# Patient Record
Sex: Male | Born: 1980
Health system: Southern US, Community
[De-identification: ages and names within clinical notes are randomized; demographics above are authoritative.]

## PROBLEM LIST (undated history)

## (undated) DIAGNOSIS — R0683 Snoring: Secondary | ICD-10-CM

## (undated) DIAGNOSIS — I10 Essential (primary) hypertension: Secondary | ICD-10-CM

## (undated) DIAGNOSIS — M549 Dorsalgia, unspecified: Secondary | ICD-10-CM

## (undated) DIAGNOSIS — R29818 Other symptoms and signs involving the nervous system: Secondary | ICD-10-CM

## (undated) DIAGNOSIS — E669 Obesity, unspecified: Secondary | ICD-10-CM

## (undated) DIAGNOSIS — Z720 Tobacco use: Secondary | ICD-10-CM

## (undated) DIAGNOSIS — F1090 Alcohol use, unspecified, uncomplicated: Secondary | ICD-10-CM

## (undated) HISTORY — DX: Dorsalgia, unspecified: M54.9

## (undated) HISTORY — DX: Essential (primary) hypertension: I10

## (undated) HISTORY — DX: Obesity, unspecified: E66.9

---
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
## 2001-02-04 ENCOUNTER — Encounter: Admission: RE | Admit: 2001-02-04 | Discharge: 2001-02-04 | Payer: Self-pay | Admitting: Family Medicine

## 2001-02-18 ENCOUNTER — Encounter: Admission: RE | Admit: 2001-02-18 | Discharge: 2001-02-18 | Payer: Self-pay | Admitting: Family Medicine

## 2001-02-20 ENCOUNTER — Encounter: Admission: RE | Admit: 2001-02-20 | Discharge: 2001-02-20 | Payer: Self-pay | Admitting: Family Medicine

## 2002-10-21 ENCOUNTER — Encounter: Payer: Self-pay | Admitting: Emergency Medicine

## 2002-10-21 ENCOUNTER — Emergency Department (HOSPITAL_COMMUNITY): Admission: EM | Admit: 2002-10-21 | Discharge: 2002-10-21 | Payer: Self-pay | Admitting: Emergency Medicine

## 2002-12-21 ENCOUNTER — Emergency Department (HOSPITAL_COMMUNITY): Admission: EM | Admit: 2002-12-21 | Discharge: 2002-12-21 | Payer: Self-pay | Admitting: Emergency Medicine

## 2003-01-17 ENCOUNTER — Emergency Department (HOSPITAL_COMMUNITY): Admission: EM | Admit: 2003-01-17 | Discharge: 2003-01-17 | Payer: Self-pay | Admitting: Emergency Medicine

## 2003-01-17 ENCOUNTER — Encounter: Payer: Self-pay | Admitting: Emergency Medicine

## 2003-07-19 ENCOUNTER — Emergency Department (HOSPITAL_COMMUNITY): Admission: EM | Admit: 2003-07-19 | Discharge: 2003-07-19 | Payer: Self-pay | Admitting: Emergency Medicine

## 2003-07-19 ENCOUNTER — Encounter: Payer: Self-pay | Admitting: *Deleted

## 2003-07-27 ENCOUNTER — Encounter: Admission: RE | Admit: 2003-07-27 | Discharge: 2003-07-27 | Payer: Self-pay | Admitting: Family Medicine

## 2003-07-28 ENCOUNTER — Encounter: Admission: RE | Admit: 2003-07-28 | Discharge: 2003-07-28 | Payer: Self-pay | Admitting: Family Medicine

## 2004-05-31 ENCOUNTER — Encounter: Admission: RE | Admit: 2004-05-31 | Discharge: 2004-05-31 | Payer: Self-pay | Admitting: Family Medicine

## 2008-09-07 ENCOUNTER — Emergency Department (HOSPITAL_COMMUNITY): Admission: EM | Admit: 2008-09-07 | Discharge: 2008-09-07 | Payer: Self-pay | Admitting: *Deleted

## 2011-06-01 ENCOUNTER — Emergency Department (HOSPITAL_COMMUNITY)
Admission: EM | Admit: 2011-06-01 | Discharge: 2011-06-01 | Disposition: A | Discharge status: Home / Self Care | Payer: Self-pay | Attending: Emergency Medicine | Admitting: Emergency Medicine

## 2011-06-01 ENCOUNTER — Emergency Department (HOSPITAL_COMMUNITY): Payer: Self-pay

## 2011-06-01 DIAGNOSIS — X58XXXA Exposure to other specified factors, initial encounter: Secondary | ICD-10-CM | POA: Insufficient documentation

## 2011-06-01 DIAGNOSIS — M546 Pain in thoracic spine: Secondary | ICD-10-CM | POA: Insufficient documentation

## 2011-06-01 DIAGNOSIS — S239XXA Sprain of unspecified parts of thorax, initial encounter: Secondary | ICD-10-CM | POA: Insufficient documentation

## 2011-07-03 ENCOUNTER — Emergency Department (HOSPITAL_COMMUNITY)
Admission: EM | Admit: 2011-07-03 | Discharge: 2011-07-03 | Disposition: A | Discharge status: Home / Self Care | Payer: Self-pay | Attending: Emergency Medicine | Admitting: Emergency Medicine

## 2011-07-03 DIAGNOSIS — J069 Acute upper respiratory infection, unspecified: Secondary | ICD-10-CM | POA: Insufficient documentation

## 2011-07-19 ENCOUNTER — Emergency Department (HOSPITAL_COMMUNITY)
Admission: EM | Admit: 2011-07-19 | Discharge: 2011-07-19 | Disposition: A | Discharge status: Home / Self Care | Payer: Self-pay | Attending: Emergency Medicine | Admitting: Emergency Medicine

## 2011-07-19 DIAGNOSIS — K089 Disorder of teeth and supporting structures, unspecified: Secondary | ICD-10-CM | POA: Insufficient documentation

## 2011-07-19 DIAGNOSIS — K029 Dental caries, unspecified: Secondary | ICD-10-CM | POA: Insufficient documentation

## 2011-09-16 LAB — RAPID STREP SCREEN (MED CTR MEBANE ONLY): Streptococcus, Group A Screen (Direct): NEGATIVE

## 2011-09-18 ENCOUNTER — Emergency Department (HOSPITAL_COMMUNITY): Payer: No Typology Code available for payment source

## 2011-09-18 ENCOUNTER — Emergency Department (HOSPITAL_COMMUNITY)
Admission: EM | Admit: 2011-09-18 | Discharge: 2011-09-18 | Disposition: A | Discharge status: Home / Self Care | Payer: No Typology Code available for payment source | Attending: Emergency Medicine | Admitting: Emergency Medicine

## 2011-09-18 DIAGNOSIS — S5000XA Contusion of unspecified elbow, initial encounter: Secondary | ICD-10-CM | POA: Insufficient documentation

## 2011-09-18 DIAGNOSIS — Y92410 Unspecified street and highway as the place of occurrence of the external cause: Secondary | ICD-10-CM | POA: Insufficient documentation

## 2011-09-18 DIAGNOSIS — IMO0002 Reserved for concepts with insufficient information to code with codable children: Secondary | ICD-10-CM | POA: Insufficient documentation

## 2011-09-18 DIAGNOSIS — M25569 Pain in unspecified knee: Secondary | ICD-10-CM | POA: Insufficient documentation

## 2011-09-27 ENCOUNTER — Inpatient Hospital Stay (INDEPENDENT_AMBULATORY_CARE_PROVIDER_SITE_OTHER)
Admission: RE | Admit: 2011-09-27 | Discharge: 2011-09-27 | Disposition: A | Discharge status: Home / Self Care | Payer: Self-pay | Source: Ambulatory Visit | Attending: Emergency Medicine | Admitting: Emergency Medicine

## 2011-09-27 DIAGNOSIS — S139XXA Sprain of joints and ligaments of unspecified parts of neck, initial encounter: Secondary | ICD-10-CM

## 2011-09-27 DIAGNOSIS — IMO0002 Reserved for concepts with insufficient information to code with codable children: Secondary | ICD-10-CM

## 2011-09-27 DIAGNOSIS — S5000XA Contusion of unspecified elbow, initial encounter: Secondary | ICD-10-CM

## 2011-12-24 ENCOUNTER — Encounter (HOSPITAL_COMMUNITY): Payer: Self-pay | Admitting: *Deleted

## 2012-07-14 ENCOUNTER — Encounter (HOSPITAL_COMMUNITY): Payer: Self-pay | Admitting: Emergency Medicine

## 2012-07-14 ENCOUNTER — Emergency Department (HOSPITAL_COMMUNITY)
Admission: EM | Admit: 2012-07-14 | Discharge: 2012-07-14 | Disposition: A | Discharge status: Home / Self Care | Payer: Self-pay | Attending: Emergency Medicine | Admitting: Emergency Medicine

## 2012-07-14 ENCOUNTER — Emergency Department (HOSPITAL_COMMUNITY): Payer: Self-pay

## 2012-07-14 DIAGNOSIS — L84 Corns and callosities: Secondary | ICD-10-CM

## 2012-07-14 DIAGNOSIS — M79672 Pain in left foot: Secondary | ICD-10-CM

## 2012-07-14 DIAGNOSIS — M79609 Pain in unspecified limb: Secondary | ICD-10-CM | POA: Insufficient documentation

## 2012-07-14 MED ORDER — IBUPROFEN 600 MG PO TABS: 600.0000 mg | ORAL_TABLET | Freq: Four times a day (QID) | ORAL | Status: AC | PRN

## 2012-07-14 NOTE — ED Notes (Signed)
Pt c/o pain to bottom of L foot under toes for 2 weeks. Pain worse over past few days. Pt denies injury to foot. Pt ambulatory at triage.

## 2012-07-14 NOTE — ED Provider Notes (Signed)
History     CSN: [id number]  Arrival date & time 07/14/12  1644   First MD Initiated Contact with Patient 07/14/12 1736      No chief complaint on file.   (Consider location/radiation/quality/duration/timing/severity/associated sxs/prior treatment) HPI  31 year old male presents complaining of left foot pain. She reports she is having pain to the bottom of his left foot underneath his toes for the past 2 weeks. Onset is acute, persistent, and worsen with weightbearing. Patient is unsure that he may have a foreign object under his foot. He denies any specific injury. States he stepped on a sewing needle but unsure of it may have broken and embedded in his foot.  Denies rash, or fever.  Has not tried anything to alleviate sxs.  Denies changing new shoes.  Denies hx of diabetes.  Denies numbness.  Request xray.   History reviewed. No pertinent past medical history.  History reviewed. No pertinent past surgical history.  No family history on file.  History  Substance Use Topics  . Smoking status: Not on file  . Smokeless tobacco: Not on file  . Alcohol Use: Not on file      Review of Systems  Constitutional: Negative for fever.  Skin: Negative for rash and wound.  Neurological: Negative for numbness.  All other systems reviewed and are negative.    Allergies  Review of patient's allergies indicates no known allergies.  Home Medications   Current Outpatient Rx  Name Route Sig Dispense Refill  . ACETAMINOPHEN 325 MG PO TABS Oral Take 650 mg by mouth every 6 (six) hours as needed. Pain.      BP 148/92  Pulse 67  Temp 98.6 F (37 C) (Oral)  Resp 16  SpO2 97%  Physical Exam  Nursing note and vitals reviewed. Constitutional: He is oriented to person, place, and time. He appears well-developed and well-nourished.       Morbidly obese  HENT:  Head: Atraumatic.  Eyes: Conjunctivae are normal.  Neck: Neck supple.  Musculoskeletal: Normal range of motion.       L  foot: tenderness to distal sole of foot proximal to 2-3 toes.  A callus were noted, no fb noted.  No rash.  FROM.  Brisk cap refill.    L ankle: normal exam.    Neurological: He is alert and oriented to person, place, and time.  Skin: Skin is warm. No rash noted.    ED Course  Procedures (including critical care time)  Labs Reviewed - No data to display No results found.   No diagnosis found.  Results for orders placed during the hospital encounter of 09/07/08  RAPID STREP SCREEN      Component Value Range   Streptococcus, Group A Screen (Direct) NEGATIVE     Dg Foot Complete Left  07/14/2012  *RADIOLOGY REPORT*  Clinical Data: Left foot pain at the ball of the foot.  LEFT FOOT - COMPLETE 3+ VIEW  Comparison: None.  Findings: Three views of the left foot were obtained.  There is normal alignment of the foot.  Negative for acute fracture or dislocation.  No gross soft tissue abnormality.  IMPRESSION: No acute findings.  Original Report Authenticated By: Richarda Overlie, M.D.    1. L foot pain  MDM  Pain to sole of L foot, with evidence of corn callus.  Doubt fb, abscess, or infection.  However, pt request xray to r/u fb.  Will obtain xray as requested.     6:51 PM Xray  negative.  Reassurance given.  I express to pt that xray may not pick up certain type of fb.  Will give referral to foot specialist.  Pt voice understanding.       Fayrene Helper, PA-C 07/14/12 [phone]

## 2012-07-15 NOTE — ED Provider Notes (Signed)
Medical screening examination/treatment/procedure(s) were performed by non-physician practitioner and as supervising physician I was immediately available for consultation/collaboration.   Gavin Pound. Oletta Lamas, MD 07/15/12 0981

## 2012-11-25 ENCOUNTER — Encounter: Payer: Self-pay | Admitting: Internal Medicine

## 2012-11-25 ENCOUNTER — Other Ambulatory Visit (INDEPENDENT_AMBULATORY_CARE_PROVIDER_SITE_OTHER): Payer: 59

## 2012-11-25 ENCOUNTER — Ambulatory Visit (INDEPENDENT_AMBULATORY_CARE_PROVIDER_SITE_OTHER): Payer: 59 | Admitting: Internal Medicine

## 2012-11-25 VITALS — BP 132/92 | HR 76 | Temp 97.8°F | Ht 72.0 in | Wt 310.0 lb

## 2012-11-25 DIAGNOSIS — Z130 Encounter for screening for diseases of the blood and blood-forming organs and certain disorders involving the immune mechanism: Secondary | ICD-10-CM

## 2012-11-25 DIAGNOSIS — Z13220 Encounter for screening for lipoid disorders: Secondary | ICD-10-CM

## 2012-11-25 DIAGNOSIS — Z0000 Encounter for general adult medical examination without abnormal findings: Secondary | ICD-10-CM

## 2012-11-25 DIAGNOSIS — Z131 Encounter for screening for diabetes mellitus: Secondary | ICD-10-CM

## 2012-11-25 LAB — LIPID PANEL
HDL: 27.9 mg/dL — ABNORMAL LOW (ref >39.00)
LDL Cholesterol: 46 mg/dL (ref 0–99)
Total CHOL/HDL Ratio: 3
Triglycerides: 103 mg/dL (ref 0.0–149.0)

## 2012-11-25 LAB — BASIC METABOLIC PANEL
CO2: 29 mEq/L (ref 19–32)
Chloride: 103 mEq/L (ref 96–112)
Sodium: 140 mEq/L (ref 135–145)

## 2012-11-25 LAB — CBC
RDW: 14.4 % (ref 11.5–14.6)
WBC: 8.1 10*3/uL (ref 4.5–10.5)

## 2012-11-25 NOTE — Patient Instructions (Signed)
Health Maintenance, Males A healthy lifestyle and preventative care can promote health and wellness.  Maintain regular health, dental, and eye exams.  Eat a healthy diet. Foods like vegetables, fruits, whole grains, low-fat dairy products, and lean protein foods contain the nutrients you need without too many calories. Decrease your intake of foods high in solid fats, added sugars, and salt. Get information about a proper diet from your caregiver, if necessary.  Regular physical exercise is one of the most important things you can do for your health. Most adults should get at least 150 minutes of moderate-intensity exercise (any activity that increases your heart rate and causes you to sweat) each week. In addition, most adults need muscle-strengthening exercises on 2 or more days a week.   Maintain a healthy weight. The body mass index (BMI) is a screening tool to identify possible weight problems. It provides an estimate of body fat based on height and weight. Your caregiver can help determine your BMI, and can help you achieve or maintain a healthy weight. For adults 20 years and older:  A BMI below 18.5 is considered underweight.  A BMI of 18.5 to 24.9 is normal.  A BMI of 25 to 29.9 is considered overweight.  A BMI of 30 and above is considered obese.  Maintain normal blood lipids and cholesterol by exercising and minimizing your intake of saturated fat. Eat a balanced diet with plenty of fruits and vegetables. Blood tests for lipids and cholesterol should begin at age 20 and be repeated every 5 years. If your lipid or cholesterol levels are high, you are over 50, or you are a high risk for heart disease, you may need your cholesterol levels checked more frequently.Ongoing high lipid and cholesterol levels should be treated with medicines, if diet and exercise are not effective.  If you smoke, find out from your caregiver how to quit. If you do not use tobacco, do not start.  If you  choose to drink alcohol, do not exceed 2 drinks per day. One drink is considered to be 12 ounces (355 mL) of beer, 5 ounces (148 mL) of wine, or 1.5 ounces (44 mL) of liquor.  Avoid use of street drugs. Do not share needles with anyone. Ask for help if you need support or instructions about stopping the use of drugs.  High blood pressure causes heart disease and increases the risk of stroke. Blood pressure should be checked at least every 1 to 2 years. Ongoing high blood pressure should be treated with medicines if weight loss and exercise are not effective.  If you are 45 to 30 years old, ask your caregiver if you should take aspirin to prevent heart disease.  Diabetes screening involves taking a blood sample to check your fasting blood sugar level. This should be done once every 3 years, after age 45, if you are within normal weight and without risk factors for diabetes. Testing should be considered at a younger age or be carried out more frequently if you are overweight and have at least 1 risk factor for diabetes.  Colorectal cancer can be detected and often prevented. Most routine colorectal cancer screening begins at the age of 50 and continues through age 75. However, your caregiver may recommend screening at an earlier age if you have risk factors for colon cancer. On a yearly basis, your caregiver may provide home test kits to check for hidden blood in the stool. Use of a small camera at the end of a tube,   to directly examine the colon (sigmoidoscopy or colonoscopy), can detect the earliest forms of colorectal cancer. Talk to your caregiver about this at age 50, when routine screening begins. Direct examination of the colon should be repeated every 5 to 10 years through age 75, unless early forms of pre-cancerous polyps or small growths are found.  Hepatitis C blood testing is recommended for all people born from 1945 through 1965 and any individual with known risks for hepatitis C.  Healthy  men should no longer receive prostate-specific antigen (PSA) blood tests as part of routine cancer screening. Consult with your caregiver about prostate cancer screening.  Testicular cancer screening is not recommended for adolescents or adult males who have no symptoms. Screening includes self-exam, caregiver exam, and other screening tests. Consult with your caregiver about any symptoms you have or any concerns you have about testicular cancer.  Practice safe sex. Use condoms and avoid high-risk sexual practices to reduce the spread of sexually transmitted infections (STIs).  Use sunscreen with a sun protection factor (SPF) of 30 or greater. Apply sunscreen liberally and repeatedly throughout the day. You should seek shade when your shadow is shorter than you. Protect yourself by wearing long sleeves, pants, a wide-brimmed hat, and sunglasses year round, whenever you are outdoors.  Notify your caregiver of new moles or changes in moles, especially if there is a change in shape or color. Also notify your caregiver if a mole is larger than the size of a pencil eraser.  A one-time screening for abdominal aortic aneurysm (AAA) and surgical repair of large AAAs by sound wave imaging (ultrasonography) is recommended for ages 65 to 75 years who are current or former smokers.  Stay current with your immunizations. Document Released: 05/30/2008 Document Revised: 02/24/2012 Document Reviewed: 04/29/2011 [employer] Patient Information 2013 [employer]. Self-Exam Of The Testicles Young men ages 15-35 are the ones who most commonly get cancer of the testicles. About 300 young men die of this disease each year. Testicular cancer does occur in middle-aged or older men but to a lesser extent. This cancer can almost always be cured if it is found before it gets bad and if it is treated. WORDS TO KNOW:  Testicles are the 2 egg-shaped glands that make hormones and sperm in men.  The scrotum is the skin around  testicles.  Epididymis is the rope-like part that is behind and above each testis. It collects sperm made by the testis. WHICH MEN ARE AT HIGH RISK FOR CANCER OF THE TESTICLES?  Men between ages 15 to 31.  Men who are Caucasian.  Men who were born with a testicle that had not moved down into the scrotum.  Men whose testicles have gotten smaller because of an infection.  Men whose fathers or brothers have had cancer of the testicles. SYMPTOMS OF TESTICULAR CANCER  A painless swelling in one of your testicles.  A hard lump. Some lumps may be an infection.  A heavy feeling in your testicles.  An ache in your lower belly (abdomen) or groin. HOW OFTEN SHOULD I CHECK MY TESTICLES? You should check your testicles every month. HOW SHOULD I DO THIS CHECK? It is best to check your testicles right after a warm shower or bath.  Look at your testicles for any swelling. You may need to use a mirror.  Use both your hands to roll each testicle between your thumb and fingers. Feel for any lumps or changes in the size of the testicle. Press firmly. You   may find that one testicle is a little bigger than the other. This is normal.  Next, check the epididymis on each testicle. This is the part where most testicular cancers happen. It is normal for the epididymis to feel soft and uneven. When you get used to how your epididymis feels, you will be able to tell if there is any change.  WHAT IF I FIND ANY SWELLINGS OR LUMPS?  Call your doctor. Some lumps may be an infection. If the lump or swelling is a cancer, it can be treated before it gets worse.  Document Released: 02/28/2009 Document Revised: 02/24/2012 Document Reviewed: 02/28/2009 [employer] Patient Information 2013 [employer].  

## 2012-11-25 NOTE — Progress Notes (Signed)
HPI  Pt presents to the clinic today to establish care. He has no concerns. He has not seen a doctor in a number of years.  History reviewed. No pertinent past medical history.  No current outpatient prescriptions on file.    No Known Allergies  Family History  Problem Relation Age of Onset  . Hypertension Mother   . Cancer Mother     pancreas  . Hyperlipidemia Mother   . Hypertension Father   . Hyperlipidemia Father   . Heart disease Sister   . Diabetes Neg Hx   . Stroke Neg Hx     History   Social History  . Marital Status: Single    Spouse Name: N/A    Number of Children: N/A  . Years of Education: GED   Occupational History  . Forklift operator    Social History Main Topics  . Smoking status: Current Every Day Smoker -- 0.2 packs/day for 10 years    Types: Cigarettes  . Smokeless tobacco: Never Used  . Alcohol Use: 1.2 oz/week    2 Shots of liquor per week  . Drug Use: Yes    Special: Marijuana  . Sexually Active: Yes    Birth Control/ Protection: Pill     Comment: 1 or less daly   Other Topics Concern  . Not on file   Social History Narrative   Regular exercise-yesCaffeine Use-yes    ROS:  Constitutional: Denies fever, malaise, fatigue, headache or abrupt weight changes.  HEENT: Denies eye pain, eye redness, ear pain, ringing in the ears, wax buildup, runny nose, nasal congestion, bloody nose, or sore throat. Respiratory: Denies difficulty breathing, shortness of breath, cough or sputum production.   Cardiovascular: Denies chest pain, chest tightness, palpitations or swelling in the hands or feet.  Gastrointestinal: Denies abdominal pain, bloating, constipation, diarrhea or blood in the stool.  GU: Denies frequency, urgency, pain with urination, blood in urine, odor or discharge. Musculoskeletal: Denies decrease in range of motion, difficulty with gait, muscle pain or joint pain and swelling.  Skin: Denies redness, rashes, lesions or ulcercations.   Neurological: Denies dizziness, difficulty with memory, difficulty with speech or problems with balance and coordination.   No other specific complaints in a complete review of systems (except as listed in HPI above).  PE:  BP 132/92  Pulse 76  Temp 97.8 F (36.6 C) (Oral)  Ht 6' (1.829 m)  Wt 310 lb (140.615 kg)  BMI 42.04 kg/m2  SpO2 96% Wt Readings from Last 3 Encounters:  11/25/12 310 lb (140.615 kg)    General: Appears his stated age, obese but well developed, well nourished in NAD. HEENT: Head: normal shape and size; Eyes: sclera white, no icterus, conjunctiva pink, PERRLA and EOMs intact; Ears: Tm's gray and intact, normal light reflex; Nose: mucosa pink and moist, septum midline; Throat/Mouth: Teeth present, mucosa pink and moist, no lesions or ulcerations noted.  Neck: Normal range of motion. Neck supple, trachea midline. No massses, lumps or thyromegaly present.  Cardiovascular: Normal rate and rhythm. S1,S2 noted.  No murmur, rubs or gallops noted. No JVD or BLE edema. No carotid bruits noted. Pulmonary/Chest: Normal effort and positive vesicular breath sounds. No respiratory distress. No wheezes, rales or ronchi noted.  Abdomen: Soft and nontender. Normal bowel sounds, no bruits noted. No distention or masses noted. Liver, spleen and kidneys non palpable. Musculoskeletal: Normal range of motion. No signs of joint swelling. No difficulty with gait.  Neurological: Alert and oriented. Cranial nerves II-XII  intact. Coordination normal. +DTRs bilaterally. Psychiatric: Mood and affect normal. Behavior is normal. Judgment and thought content normal.    Assessment and Plan:  Preventative Health Maintenance:  Will obtain basic screening labs today: BMET, lipids, CBC, HgbA1C Start a diet and exercise program Tetanus UTD Declines flu shot Will schedule an appt with dentist  Elevated Blood pressure Reading:  Maintain a low salt low fat diet Start an exercise program If  still elevated in 3 months will start medication  RTC in 3 months for evaluation of blood pressure.

## 2012-11-26 ENCOUNTER — Telehealth: Payer: Self-pay | Admitting: *Deleted

## 2012-11-26 NOTE — Telephone Encounter (Signed)
Left message for pt to callback office.   Ash,  Can you please send this to Mr. Coffin and let him know that all his labs were normal. Thx!  Rene Kocher

## 2012-11-27 NOTE — Telephone Encounter (Signed)
Notified pt with NP response...lmb 

## 2012-11-27 NOTE — Telephone Encounter (Signed)
Left message for pt to callback office.  

## 2013-06-04 ENCOUNTER — Ambulatory Visit: Payer: 59 | Admitting: Internal Medicine

## 2014-03-11 ENCOUNTER — Emergency Department (HOSPITAL_COMMUNITY)
Admission: EM | Admit: 2014-03-11 | Discharge: 2014-03-11 | Disposition: A | Discharge status: Home / Self Care | Payer: PRIVATE HEALTH INSURANCE | Attending: Emergency Medicine | Admitting: Emergency Medicine

## 2014-03-11 ENCOUNTER — Encounter (HOSPITAL_COMMUNITY): Payer: Self-pay | Admitting: Emergency Medicine

## 2014-03-11 DIAGNOSIS — X58XXXA Exposure to other specified factors, initial encounter: Secondary | ICD-10-CM | POA: Insufficient documentation

## 2014-03-11 DIAGNOSIS — S2341XA Sprain of ribs, initial encounter: Secondary | ICD-10-CM | POA: Insufficient documentation

## 2014-03-11 DIAGNOSIS — Y929 Unspecified place or not applicable: Secondary | ICD-10-CM | POA: Insufficient documentation

## 2014-03-11 DIAGNOSIS — S29011A Strain of muscle and tendon of front wall of thorax, initial encounter: Secondary | ICD-10-CM

## 2014-03-11 DIAGNOSIS — F17200 Nicotine dependence, unspecified, uncomplicated: Secondary | ICD-10-CM | POA: Insufficient documentation

## 2014-03-11 DIAGNOSIS — R0789 Other chest pain: Secondary | ICD-10-CM

## 2014-03-11 DIAGNOSIS — Y939 Activity, unspecified: Secondary | ICD-10-CM | POA: Insufficient documentation

## 2014-03-11 MED ORDER — TRAMADOL HCL 50 MG PO TABS: 50.0000 mg | ORAL_TABLET | Freq: Four times a day (QID) | ORAL | Status: DC | PRN

## 2014-03-11 MED ORDER — CYCLOBENZAPRINE HCL 10 MG PO TABS: 10.0000 mg | ORAL_TABLET | Freq: Two times a day (BID) | ORAL | Status: DC | PRN

## 2014-03-11 MED ORDER — IBUPROFEN 600 MG PO TABS: 600.0000 mg | ORAL_TABLET | Freq: Four times a day (QID) | ORAL | Status: DC | PRN

## 2014-03-11 NOTE — ED Provider Notes (Signed)
Medical screening examination/treatment/procedure(s) were performed by non-physician practitioner and as supervising physician I was immediately available for consultation/collaboration.   EKG Interpretation None       Raeford Razor, MD 03/11/14 [phone]

## 2014-03-11 NOTE — ED Provider Notes (Signed)
CSN: [id number]     Arrival date & time 03/11/14  1253 History  This chart was scribed for non-physician practitioner Junius Finner, PA-C working with Raeford Razor, MD by Dorothey Baseman, ED Scribe. This patient was seen in room WTR5/WTR5 and the patient's care was started at 1:46 PM.    No chief complaint on file.  The history is provided by the patient. No language interpreter was used.   HPI Comments: Henry Chandler is a 33 y.o. male who presents to the Emergency Department complaining of an intermittent, aching, non-radiating pain, 6/10 currently, to the left ribs that he states has been ongoing for the past month. Patient denies any exacerbating or alleviating factors. He denies any potential injury or trauma to the area or recent heavy lifting. Patient reports intermittently taking ibuprofen at home without significant relief. He also reports taking hydrocodone that belongs to his mother, but states that it just causes him to fall asleep. He denies fever, nausea, emesis, dysuria, hematuria, rash, ecchymosis. He denies history of nephrolithiasis. Patient has no other pertinent medical history.   History reviewed. No pertinent past medical history. History reviewed. No pertinent past surgical history. Family History  Problem Relation Age of Onset  . Hypertension Mother   . Cancer Mother     pancreas  . Hyperlipidemia Mother   . Hypertension Father   . Hyperlipidemia Father   . Heart disease Sister   . Diabetes Neg Hx   . Stroke Neg Hx    History  Substance Use Topics  . Smoking status: Current Every Day Smoker -- 0.25 packs/day for 10 years    Types: Cigarettes  . Smokeless tobacco: Never Used  . Alcohol Use: 1.2 oz/week    2 Shots of liquor per week    Review of Systems  Constitutional: Negative for fever.  Respiratory:       Left rib pain  Gastrointestinal: Negative for nausea and vomiting.  Genitourinary: Negative for dysuria and hematuria.  Skin: Negative for color change and  rash.  All other systems reviewed and are negative.   Allergies  Review of patient's allergies indicates no known allergies.  Home Medications   Current Outpatient Rx  Name  Route  Sig  Dispense  Refill  . cyclobenzaprine (FLEXERIL) 10 MG tablet   Oral   Take 1 tablet (10 mg total) by mouth 2 (two) times daily as needed for muscle spasms.   20 tablet   0   . ibuprofen (ADVIL,MOTRIN) 600 MG tablet   Oral   Take 1 tablet (600 mg total) by mouth every 6 (six) hours as needed.   30 tablet   0   . traMADol (ULTRAM) 50 MG tablet   Oral   Take 1 tablet (50 mg total) by mouth every 6 (six) hours as needed.   15 tablet   0     Triage Vitals: BP 154/95  Pulse 74  Temp(Src) 98.3 F (36.8 C) (Oral)  Resp 18  SpO2 98%  Physical Exam  Nursing note and vitals reviewed. Constitutional: He is oriented to person, place, and time. He appears well-developed and well-nourished.  HENT:  Head: Normocephalic and atraumatic.  Eyes: EOM are normal.  Neck: Normal range of motion.  Cardiovascular: Normal rate, regular rhythm and normal heart sounds.   Pulmonary/Chest: Effort normal and breath sounds normal. He exhibits tenderness.  Tenderness to the left ribs.   Musculoskeletal: Normal range of motion.  No spinal tenderness.   Neurological: He is alert and  oriented to person, place, and time.  Skin: Skin is warm and dry.  Psychiatric: He has a normal mood and affect. His behavior is normal.    ED Course  Procedures (including critical care time)  DIAGNOSTIC STUDIES: Oxygen Saturation is 98% on room air, normal by my interpretation.    COORDINATION OF CARE: 1:51 PM- Discussed that symptoms are likely muscular in nature so imaging will not be necessary today. Advised patient to alternate heat and ice to the area at home. Will discharge patient with a short course of pain medication and muscle relaxants to manage symptoms. Discussed treatment plan with patient at bedside and patient  verbalized agreement.     Labs Review Labs Reviewed - No data to display Imaging Review No results found.   EKG Interpretation None      MDM   Final diagnoses:  Left-sided chest wall pain  Muscle strain of chest wall   Pt c/o chest wall pain. No hx of trauma. Not concerned for ACS or PE.  Will tx as musculoskeletal pain. Return precautions provided. Pt verbalized understanding and agreement with tx plan.   I personally performed the services described in this documentation, which was scribed in my presence. The recorded information has been reviewed and is accurate.     Junius Finnerrin O'Malley, PA-C 03/11/14 1507

## 2014-03-11 NOTE — ED Notes (Signed)
Pt reports leftsided lower back pain that has persisted for a month. Denies radiation down legs, fall or injury. Denies Urinary symptoms. Pt ambulated without difficulty.

## 2014-05-19 ENCOUNTER — Emergency Department (HOSPITAL_COMMUNITY)
Admission: EM | Admit: 2014-05-19 | Discharge: 2014-05-19 | Disposition: A | Discharge status: Home / Self Care | Payer: PRIVATE HEALTH INSURANCE | Attending: Emergency Medicine | Admitting: Emergency Medicine

## 2014-05-19 ENCOUNTER — Encounter (HOSPITAL_COMMUNITY): Payer: Self-pay | Admitting: Emergency Medicine

## 2014-05-19 DIAGNOSIS — Z79899 Other long term (current) drug therapy: Secondary | ICD-10-CM | POA: Diagnosis not present

## 2014-05-19 DIAGNOSIS — M436 Torticollis: Secondary | ICD-10-CM | POA: Diagnosis not present

## 2014-05-19 DIAGNOSIS — M542 Cervicalgia: Secondary | ICD-10-CM | POA: Diagnosis present

## 2014-05-19 DIAGNOSIS — F17200 Nicotine dependence, unspecified, uncomplicated: Secondary | ICD-10-CM | POA: Diagnosis not present

## 2014-05-19 MED ORDER — LIDOCAINE 5 % EX PTCH
1.0000 | MEDICATED_PATCH | Freq: Once | CUTANEOUS | Status: DC
  Administered 2014-05-19: 1 via TRANSDERMAL
  Filled 2014-05-19: qty 1

## 2014-05-19 MED ORDER — LIDOCAINE 5 % EX PTCH: 1.0000 | MEDICATED_PATCH | CUTANEOUS | Status: DC

## 2014-05-19 MED ORDER — HYDROMORPHONE HCL PF 1 MG/ML IJ SOLN
1.0000 mg | Freq: Once | INTRAMUSCULAR | Status: AC
  Administered 2014-05-19: 1 mg via INTRAMUSCULAR
  Filled 2014-05-19: qty 1

## 2014-05-19 MED ORDER — DIAZEPAM 5 MG PO TABS: 5.0000 mg | ORAL_TABLET | Freq: Two times a day (BID) | ORAL | Status: DC

## 2014-05-19 MED ORDER — KETOROLAC TROMETHAMINE 30 MG/ML IJ SOLN
30.0000 mg | Freq: Once | INTRAMUSCULAR | Status: AC
  Administered 2014-05-19: 30 mg via INTRAMUSCULAR
  Filled 2014-05-19: qty 1

## 2014-05-19 MED ORDER — DIAZEPAM 5 MG/ML IJ SOLN
5.0000 mg | Freq: Once | INTRAMUSCULAR | Status: AC
  Administered 2014-05-19: 5 mg via INTRAMUSCULAR
  Filled 2014-05-19: qty 2

## 2014-05-19 MED ORDER — MELOXICAM 7.5 MG PO TABS
15.0000 mg | ORAL_TABLET | Freq: Every day | ORAL | Status: DC
Start: 2014-05-19 — End: 2014-06-07

## 2014-05-19 NOTE — ED Provider Notes (Signed)
CSN: [id number]     Arrival date & time 05/19/14  0359 History   First MD Initiated Contact with Patient 05/19/14 [phone]     Chief Complaint  Patient presents with  . Torticollis     (Consider location/radiation/quality/duration/timing/severity/associated sxs/prior Treatment) HPI Comments: Patient is a 33 yo M PMHx significant for tobacco use presenting to the ED for right sided neck pain that began Tuesday morning when he awoke and has been getting gradually worse since. Patient describes his pain as severe tight pain extending from neck into shoulder. He states he is unable to turn his head d/t pain and spasm. Medications tried prior to arrival: Motrin. Alleviating factors: nothing. Aggravating factors: movement, palpation. Denies any fevers, headaches, visual disturbances.      History reviewed. No pertinent past medical history. History reviewed. No pertinent past surgical history. Family History  Problem Relation Age of Onset  . Hypertension Mother   . Cancer Mother     pancreas  . Hyperlipidemia Mother   . Hypertension Father   . Hyperlipidemia Father   . Heart disease Sister   . Diabetes Neg Hx   . Stroke Neg Hx    History  Substance Use Topics  . Smoking status: Current Every Day Smoker -- 0.25 packs/day for 10 years    Types: Cigarettes  . Smokeless tobacco: Never Used  . Alcohol Use: 1.2 oz/week    2 Shots of liquor per week    Review of Systems  Constitutional: Negative for fever and chills.  Musculoskeletal: Positive for myalgias and neck pain.  All other systems reviewed and are negative.     Allergies  Review of patient's allergies indicates no known allergies.  Home Medications   Prior to Admission medications   Medication Sig Start Date End Date Taking? Authorizing Provider  ibuprofen (ADVIL,MOTRIN) 200 MG tablet Take 400-800 mg by mouth every 6 (six) hours as needed (for pain.).   Yes Historical Provider, MD  diazepam (VALIUM) 5 MG tablet Take 1  tablet (5 mg total) by mouth 2 (two) times daily. 05/19/14   Devine Dant L Marrisa Kimber, PA-C  lidocaine (LIDODERM) 5 % Place 1 patch onto the skin daily. Remove & Discard patch within 12 hours or as directed by MD 05/19/14   Lise AuerJennifer L Brain Honeycutt, PA-C  meloxicam (MOBIC) 7.5 MG tablet Take 2 tablets (15 mg total) by mouth daily. 05/19/14   Neri Vieyra L Kenan Moodie, PA-C   BP 137/88  Pulse 70  Temp(Src) 99.8 F (37.7 C) (Oral)  Resp 18  Ht 6\' 1"  (1.854 m)  Wt 310 lb (140.615 kg)  BMI 40.91 kg/m2  SpO2 97% Physical Exam  Nursing note and vitals reviewed. Constitutional: He is oriented to person, place, and time. He appears well-developed and well-nourished. No distress.  HENT:  Head: Normocephalic and atraumatic.  Right Ear: External ear normal.  Left Ear: External ear normal.  Nose: Nose normal.  Mouth/Throat: Oropharynx is clear and moist. No oropharyngeal exudate.  Eyes: Conjunctivae and EOM are normal. Pupils are equal, round, and reactive to light.  Neck: Normal range of motion. Neck supple. Muscular tenderness present. No spinous process tenderness present.    Cardiovascular: Normal rate, regular rhythm, normal heart sounds and intact distal pulses.   Pulmonary/Chest: Effort normal and breath sounds normal. No respiratory distress.  Abdominal: Soft. There is no tenderness.  Musculoskeletal: Normal range of motion.  Neurological: He is alert and oriented to person, place, and time.  Skin: Skin is warm and dry. He is not  diaphoretic.  Psychiatric: He has a normal mood and affect.    ED Course  Procedures (including critical care time) Medications  lidocaine (LIDODERM) 5 % 1 patch (1 patch Transdermal Patch Applied 05/19/14 0509)  diazepam (VALIUM) injection 5 mg (5 mg Intramuscular Given 05/19/14 0429)  ketorolac (TORADOL) 30 MG/ML injection 30 mg (30 mg Intramuscular Given 05/19/14 0429)  HYDROmorphone (DILAUDID) injection 1 mg (1 mg Intramuscular Given 05/19/14 0507)    Labs  Review Labs Reviewed - No data to display  Imaging Review No results found.   EKG Interpretation None      MDM   Final diagnoses:  Right torticollis    Filed Vitals:   05/19/14 0609  BP: 137/88  Pulse: 70  Temp:   Resp: 18    Afebrile, NAD, non-toxic appearing, AAOx4.   Symptoms consistent with torticollis. Pain and symptoms improved in ED. No imaging needed at this time. Will d/c with pain medication. Return precautions discussed. Patient is agreeable to plan. Patient is stable at time of discharge      Jeannetta Ellis, PA-C 05/19/14 9417

## 2014-05-19 NOTE — ED Provider Notes (Signed)
Medical screening examination/treatment/procedure(s) were performed by non-physician practitioner and as supervising physician I was immediately available for consultation/collaboration.   EKG Interpretation None        Lyanne Co, MD 05/19/14 [phone]

## 2014-05-19 NOTE — ED Notes (Signed)
Pt c/o R sided neck/shoulder pain onset yesterday, pt unable to turn head d/t pain. Denies injury

## 2014-05-19 NOTE — Discharge Instructions (Signed)
Please follow up with your primary care physician in 1-2 days. If you do not have one please call the [facility] number listed above. Please take pain medication and/or muscle relaxants as prescribed and as needed for pain. Please do not drive on narcotic pain medication or on muscle relaxants. Please alternate between Motrin and Tylenol every three hours for fevers and pain. Please read all discharge instructions and return precautions.   Torticollis, Acute You have suddenly (acutely) developed a twisted neck (torticollis). This is usually a self-limited condition. CAUSES  Acute torticollis may be caused by malposition, trauma or infection. Most commonly, acute torticollis is caused by sleeping in an awkward position. Torticollis may also be caused by the flexion, extension or twisting of the neck muscles beyond their normal position. Sometimes, the exact cause may not be known. SYMPTOMS  Usually, there is pain and limited movement of the neck. Your neck may twist to one side. DIAGNOSIS  The diagnosis is often made by physical examination. X-rays, CT scans or MRIs may be done if there is a history of trauma or concern of infection. TREATMENT  For a common, stiff neck that develops during sleep, treatment is focused on relaxing the contracted neck muscle. Medications (including shots) may be used to treat the problem. Most cases resolve in several days. Torticollis usually responds to conservative physical therapy. If left untreated, the shortened and spastic neck muscle can cause deformities in the face and neck. Rarely, surgery is required. HOME CARE INSTRUCTIONS   Use over-the-counter and prescription medications as directed by your caregiver.  Do stretching exercises and massage the neck as directed by your caregiver.  Follow up with physical therapy if needed and as directed by your caregiver. SEEK IMMEDIATE MEDICAL CARE IF:   You develop difficulty breathing or noisy  breathing (stridor).  You drool, develop trouble swallowing or have pain with swallowing.  You develop numbness or weakness in the hands or feet.  You have changes in speech or vision.  You have problems with urination or bowel movements.  You have difficulty walking.  You have a fever.  You have increased pain. MAKE SURE YOU:   Understand these instructions.  Will watch your condition.  Will get help right away if you are not doing well or get worse. Document Released: 11/29/2000 Document Revised: 02/24/2012 Document Reviewed: 01/10/2010 [facility] Patient Information 2014 [place], [place].

## 2014-05-23 ENCOUNTER — Emergency Department (HOSPITAL_COMMUNITY)
Admission: EM | Admit: 2014-05-23 | Discharge: 2014-05-24 | Disposition: A | Discharge status: Home / Self Care | Payer: PRIVATE HEALTH INSURANCE | Attending: Emergency Medicine | Admitting: Emergency Medicine

## 2014-05-23 ENCOUNTER — Encounter (HOSPITAL_COMMUNITY): Payer: Self-pay | Admitting: Emergency Medicine

## 2014-05-23 DIAGNOSIS — M436 Torticollis: Secondary | ICD-10-CM | POA: Insufficient documentation

## 2014-05-23 DIAGNOSIS — Z87891 Personal history of nicotine dependence: Secondary | ICD-10-CM | POA: Insufficient documentation

## 2014-05-23 DIAGNOSIS — Z791 Long term (current) use of non-steroidal anti-inflammatories (NSAID): Secondary | ICD-10-CM | POA: Insufficient documentation

## 2014-05-23 DIAGNOSIS — M542 Cervicalgia: Secondary | ICD-10-CM | POA: Diagnosis present

## 2014-05-23 MED ORDER — CYCLOBENZAPRINE HCL 10 MG PO TABS
10.0000 mg | ORAL_TABLET | Freq: Once | ORAL | Status: AC
  Administered 2014-05-24: 10 mg via ORAL
  Filled 2014-05-23: qty 1

## 2014-05-23 MED ORDER — HYDROCODONE-ACETAMINOPHEN 5-325 MG PO TABS
2.0000 | ORAL_TABLET | Freq: Once | ORAL | Status: AC
  Administered 2014-05-24: 2 via ORAL
  Filled 2014-05-23: qty 2

## 2014-05-23 MED ORDER — KETOROLAC TROMETHAMINE 60 MG/2ML IM SOLN
60.0000 mg | Freq: Once | INTRAMUSCULAR | Status: AC
  Administered 2014-05-24: 60 mg via INTRAMUSCULAR
  Filled 2014-05-23: qty 2
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
## 2014-05-23 NOTE — ED Provider Notes (Signed)
CSN: [id number]     Arrival date & time 05/23/14  2045 History   None    This chart was scribed for non-physician practitioner, Junius Finner PA-C, working with Enid Skeens, MD by Arlan Organ, ED Scribe. This patient was seen in room WTR8/WTR8 and the patient's care was started at 11:34 PM.   Chief Complaint  Patient presents with  . Neck Pain   The history is provided by the patient. No language interpreter was used.    HPI Comments: Henry Chandler is a 33 y.o. male who presents to the Emergency Department complaining of constant, moderate R sided neck pain x 1 week that is unchanged. Pt also reports associated neck stiffness. Pt was seen 6/4 for same and was treated with a Lidocaine patch, Valium, and Mobic. He denies any new recent injury or trauma. However, pt states he does a lot of heavy lifting at work. States he is unable to fully rotate this neck in all directions secondary to pain. He has tried using his prescribed medications from last visit without any long term improvement. At this time he denies any fever, chills, numbness, sore throat, or loss of sensation. No history of cancer or IV drug use. No pertinent past medical history. No other concerns this visit.  History reviewed. No pertinent past medical history.  History reviewed. No pertinent past surgical history. Family History  Problem Relation Age of Onset  . Hypertension Mother   . Cancer Mother     pancreas  . Hyperlipidemia Mother   . Hypertension Father   . Hyperlipidemia Father   . Heart disease Sister   . Diabetes Neg Hx   . Stroke Neg Hx    History  Substance Use Topics  . Smoking status: Former Smoker -- 0.25 packs/day for 10 years  . Smokeless tobacco: Never Used  . Alcohol Use: No    Review of Systems  Constitutional: Negative for fever.  HENT: Negative for congestion.   Eyes: Negative for redness.  Respiratory: Negative for cough.   Musculoskeletal: Positive for neck pain and neck stiffness.   Skin: Negative for rash.  Neurological: Negative for weakness and numbness.  Psychiatric/Behavioral: Negative for confusion.      Allergies  Review of patient's allergies indicates no known allergies.  Home Medications   Prior to Admission medications   Medication Sig Start Date End Date Taking? Authorizing Provider  ibuprofen (ADVIL,MOTRIN) 200 MG tablet Take 400-800 mg by mouth every 6 (six) hours as needed (for pain.).    Historical Provider, MD  lidocaine (LIDODERM) 5 % Place 1 patch onto the skin daily. Remove & Discard patch within 12 hours or as directed by MD 05/19/14   Lise Auer Piepenbrink, PA-C  meloxicam (MOBIC) 7.5 MG tablet Take 2 tablets (15 mg total) by mouth daily. 05/19/14   Jennifer L Piepenbrink, PA-C  methocarbamol (ROBAXIN) 500 MG tablet Take 1 tablet (500 mg total) by mouth 2 (two) times daily. 05/24/14   Junius Finner, PA-C  oxyCODONE-acetaminophen (PERCOCET/ROXICET) 5-325 MG per tablet Take 1-2 tablets by mouth every 6 (six) hours as needed for moderate pain or severe pain. 05/24/14   Junius Finner, PA-C   Triage Vitals: BP 146/103  Pulse 80  Temp(Src) 97.7 F (36.5 C) (Oral)  Resp 18  SpO2 99%   Physical Exam  Nursing note and vitals reviewed. Constitutional: He is oriented to person, place, and time. He appears well-developed and well-nourished.  Pt appears well, non-toxic. NAD.  HENT:  Head: Normocephalic  and atraumatic.  Eyes: EOM are normal.  Neck:  Head rotation limited to 45 degrees to left and right, limited due to pain.  Decreased head flexion and extension due to pain in right side of neck. No midline spinal tenderness, step-offs or crepitus.  Muscle spasm palpated in right upper trapezius and right cervical spinal muscles.   Cardiovascular: Normal rate, regular rhythm and normal heart sounds.   Pulmonary/Chest: Breath sounds normal. No respiratory distress. He has no wheezes. He has no rales. He exhibits no tenderness.  Abdominal: Soft. He  exhibits no distension. There is no tenderness.  Musculoskeletal: Normal range of motion.  No midline spinal tenderness. FROM all 4 extremities. Increased pain in right side of neck with full abduction of right arm.  Neurological: He is alert and oriented to person, place, and time.  Skin: Skin is warm and dry.  Skin in tact. No ecchymosis, erythema, or warmth. No red streaking, induration, or evidence of underlying infection.  Psychiatric: He has a normal mood and affect. His behavior is normal.    ED Course  Procedures (including critical care time)  DIAGNOSTIC STUDIES: Oxygen Saturation is 99% on RA, Normal by my interpretation.    COORDINATION OF CARE: Discussed treatment plan with pt at bedside and pt agreed to plan.     Labs Review Labs Reviewed - No data to display  Imaging Review Dg Cervical Spine Complete  05/24/2014   CLINICAL DATA:  Neck stiffness and pain for 1 week, worsening.  EXAM: CERVICAL SPINE  4+ VIEWS  COMPARISON:  None.  FINDINGS: Cervical vertebral bodies and posterior elements appear intact and aligned to the inferior endplate of C7, the most caudal well visualized level. Straightened cervical lordosis. Intervertebral disc heights preserved. Ventral endplate spurring at C5-6 with small osteophyte. No destructive bony lesions. Lateral masses in alignment though, odontoid view partially obscured by overlying teeth. Prevertebral and paraspinal soft tissue planes are nonsuspicious.  IMPRESSION: Straightened cervical lordosis without acute fracture deformity or malalignment.   Electronically Signed   By: Awilda Metroourtnay  Bloomer   On: 05/24/2014 00:39     EKG Interpretation None      MDM   Final diagnoses:  Torticollis   Pt presenting to ED for 2nd time in 4 days for right sided torticollis.  No known injuries.  Pt appears well, non-toxic. Afebrile. No systemic symptoms. Not concerned for meningitis or other emergent process taking place.  However due to continued pain,  plain films of cervical spine performed to ensure no acute deformity.   Plain films: straightened cervical lordosis. No acute fx or malalignment.  Will tx with pain medication: toradol, norco and morphine in ED with moderate relief after morphine.  Will discharge home with reassurance, advised to f/u with PCP. Home care instructions provided. Return precautions provided. Pt verbalized understanding and agreement with tx plan.   Discussed pt with Dr. Jodi MourningZavitz who agrees with tx plan.  I personally performed the services described in this documentation, which was scribed in my presence. The recorded information has been reviewed and is accurate.    Junius FinnerErin O'Malley, PA-C 05/24/14 [phone]

## 2014-05-23 NOTE — ED Notes (Signed)
Pt presents with c/o neck pain and stiffness. Pt was seen last Thursday for same. Pt denies any injury but says that his neck feel very stiff. Ambulatory to triage, NAD.

## 2014-05-24 ENCOUNTER — Emergency Department (HOSPITAL_COMMUNITY): Payer: PRIVATE HEALTH INSURANCE

## 2014-05-24 MED ORDER — MORPHINE SULFATE 4 MG/ML IJ SOLN
4.0000 mg | Freq: Once | INTRAMUSCULAR | Status: AC
  Administered 2014-05-24: 4 mg via INTRAMUSCULAR
  Filled 2014-05-24: qty 1

## 2014-05-24 MED ORDER — METHOCARBAMOL 500 MG PO TABS: 500.0000 mg | ORAL_TABLET | Freq: Two times a day (BID) | ORAL | Status: DC

## 2014-05-24 MED ORDER — OXYCODONE-ACETAMINOPHEN 5-325 MG PO TABS: 1.0000 | ORAL_TABLET | Freq: Four times a day (QID) | ORAL | Status: DC | PRN

## 2014-05-24 NOTE — ED Provider Notes (Signed)
Medical screening examination/treatment/procedure(s) were performed by non-physician practitioner and as supervising physician I was immediately available for consultation/collaboration.   EKG Interpretation None        Jaydence Vanyo M Adella Manolis, MD 05/24/14 0741 

## 2014-06-07 ENCOUNTER — Encounter: Payer: Self-pay | Admitting: Family Medicine

## 2014-06-07 ENCOUNTER — Ambulatory Visit (INDEPENDENT_AMBULATORY_CARE_PROVIDER_SITE_OTHER): Payer: PRIVATE HEALTH INSURANCE | Admitting: Family Medicine

## 2014-06-07 VITALS — BP 150/101 | HR 83 | Ht 73.0 in | Wt 321.0 lb

## 2014-06-07 DIAGNOSIS — E669 Obesity, unspecified: Secondary | ICD-10-CM

## 2014-06-07 DIAGNOSIS — M79609 Pain in unspecified limb: Secondary | ICD-10-CM

## 2014-06-07 DIAGNOSIS — M79642 Pain in left hand: Secondary | ICD-10-CM

## 2014-06-07 DIAGNOSIS — Z0000 Encounter for general adult medical examination without abnormal findings: Secondary | ICD-10-CM

## 2014-06-07 DIAGNOSIS — M79641 Pain in right hand: Secondary | ICD-10-CM

## 2014-06-07 DIAGNOSIS — I110 Hypertensive heart disease with heart failure: Secondary | ICD-10-CM | POA: Insufficient documentation

## 2014-06-07 DIAGNOSIS — Z23 Encounter for immunization: Secondary | ICD-10-CM

## 2014-06-07 DIAGNOSIS — E785 Hyperlipidemia, unspecified: Secondary | ICD-10-CM

## 2014-06-07 DIAGNOSIS — I10 Essential (primary) hypertension: Secondary | ICD-10-CM | POA: Insufficient documentation

## 2014-06-07 DIAGNOSIS — Z131 Encounter for screening for diabetes mellitus: Secondary | ICD-10-CM

## 2014-06-07 MED ORDER — DICLOFENAC SODIUM 50 MG PO TBEC: DELAYED_RELEASE_TABLET | ORAL | Status: DC

## 2014-06-07 MED ORDER — LISINOPRIL 20 MG PO TABS: ORAL_TABLET | ORAL | Status: DC

## 2014-06-07 NOTE — Addendum Note (Signed)
Addended by: Wyline Beady on: 06/07/2014 03:29 PM   Modules accepted: Orders

## 2014-06-07 NOTE — Progress Notes (Signed)
CC: Jeanine LuzMicah Chandler is a 33 y.o. male is here for Establish Care and cpe   Subjective: HPI:  Colonoscopy: No family history of colon cancer will begin screening at age 33 Prostate: Discussed screening risks/beneifts with patient during today's visit, no family history of prostate cancer, we'll consider screening starting at age 33.  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 No interventions as of yet. Localized to the knuckles and the joints in the fingers. Mild in severity. Present on most days of the week. Denies swelling redness or warmth. Denies motor or sensory disturbances in either upper extremity.  Reports that blood pressure has been elevated at almost every doctor's visit he's been to in the last 3 years. Strong family history of hypertension. He's never been on antihypertensives. He's been unsuccessful at finding diet and exercise interventions to lower blood pressure.  Rare alcohol use, rare tobacco use, no recreational drug use  Review of Systems - General ROS: negative for - chills, fever, night sweats, weight gain or weight loss Ophthalmic ROS: negative for - decreased vision Psychological ROS: negative for - anxiety or depression ENT ROS: negative for - hearing change, nasal congestion, tinnitus or allergies Hematological and Lymphatic ROS: negative for - bleeding problems, bruising or swollen lymph nodes Breast ROS: negative Respiratory ROS: no cough, shortness of breath, or wheezing Cardiovascular ROS: no chest pain or dyspnea on exertion Gastrointestinal ROS: no abdominal pain, change in bowel habits, or black or bloody stools Genito-Urinary ROS:  negative for - genital discharge, genital ulcers, incontinence or abnormal bleeding from genitals Musculoskeletal ROS: negative for - joint pain or muscle pain other than that described above Neurological ROS: negative for - headaches or memory loss Dermatological ROS: negative for lumps, mole changes, rash and skin lesion changes Past Medical History  Diagnosis Date  . Hypertension     No past surgical history on file. Family History  Problem Relation Age of Onset  . Hypertension Mother   . Cancer Mother     pancreas  . Hyperlipidemia Mother   . Hypertension Father   . Hyperlipidemia Father   . Heart disease Sister   . Diabetes Neg Hx   . Stroke Neg Hx     History   Social History  . Marital Status: Single    Spouse Name: N/A    Number of Children: N/A  . Years of Education: GED   Occupational History  . Forklift operator    Social History Main Topics  . Smoking status: Former Smoker -- 0.25 packs/day for 10 years  . Smokeless tobacco: Never Used  . Alcohol Use: No  . Drug Use: No  . Sexual Activity: Yes    Birth Control/ Protection: Pill     Comment: 1 or less daly   Other Topics Concern  . Not on file   Social History Narrative   Regular exercise-yes   Caffeine Use-yes     Objective: BP 150/101  Pulse 83  Ht 6\' 1"  (1.854 m)  Wt 321 lb (145.605 kg)  BMI 42.36 kg/m2  General: No Acute Distress HEENT: Atraumatic, normocephalic, conjunctivae normal without scleral icterus.  No nasal discharge, hearing grossly intact, TMs with good landmarks bilaterally  with no middle ear abnormalities, posterior pharynx clear without oral lesions. Neck: Supple, trachea midline, no cervical nor supraclavicular adenopathy. Pulmonary: Clear to auscultation bilaterally without wheezing, rhonchi, nor rales. Cardiac: Regular rate and rhythm.  No murmurs, rubs, nor gallops. No peripheral edema.  2+ peripheral pulses bilaterally. Abdomen: Bowel sounds normal.  No masses.   Non-tender without rebound.  Negative Murphy's sign. GU: No inguinal hernia  MSK: Grossly intact, no signs of weakness.  Full strength throughout upper and lower extremities.  Full ROM in upper and lower extremities.  No midline spinal tenderness. Neuro: Gait unremarkable, CN II-XII grossly intact.  C5-C6 Reflex 2/4 Bilaterally, L4 Reflex 2/4 Bilaterally.  Cerebellar function intact. Skin: No rashes. Psych: Alert and oriented to person/place/time.  Thought process normal. No anxiety/depression.  Assessment & Plan: Montarius was seen today for establish care and cpe.  Diagnoses and associated orders for this visit:  Annual physical exam - TSH  Essential hypertension, benign - BASIC METABOLIC PANEL WITH GFR - lisinopril (PRINIVIL,ZESTRIL) 20 MG tablet; One tablet by mouth daily for blood pressure control.  Dyslipidemia - Lipid panel  Diabetes mellitus screening - BASIC METABOLIC PANEL WITH GFR  Obesity - TSH  Bilateral hand pain - diclofenac (VOLTAREN) 50 MG EC tablet; Take one tablet every 8 hours only as needed for pain, take with small snack.    Healthy lifestyle interventions including but not limited to regular exercise, a healthy low fat diet, moderation of salt intake, the dangers of tobacco/alcohol/recreational drug use, nutrition supplementation, and accident avoidance were discussed with the patient and a handout was provided for future reference. Due for routine dyslipidemia and diabetic screening. Checking TSH given obesity. Essential hypertension: Start lisinopril checking renal function and return in 1-2 months for repeat pressure and renal function check Bilateral hand pain: Start as needed diclofenac   No Follow-up on file.

## 2014-06-07 NOTE — Patient Instructions (Signed)
Dr. Hommel's General Advice Following Your Complete Physical Exam  The Benefits of Regular Exercise: Unless you suffer from an uncontrolled cardiovascular condition, studies strongly suggest that regular exercise and physical activity will add to both the quality and length of your life.  The World Health Organization recommends 150 minutes of moderate intensity aerobic activity every week.  This is best split over 3-4 days a week, and can be as simple as a brisk walk for just over 35 minutes "most days of the week".  This type of exercise has been shown to lower LDL-Cholesterol, lower average blood sugars, lower blood pressure, lower cardiovascular disease risk, improve memory, and increase one's overall sense of wellbeing.  The addition of anaerobic (or "strength training") exercises offers additional benefits including but not limited to increased metabolism, prevention of osteoporosis, and improved overall cholesterol levels.  How Can I Strive For A Low-Fat Diet?: Current guidelines recommend that 25-35 percent of your daily energy (food) intake should come from fats.  One might ask how can this be achieved without having to dissect each meal on a daily basis?  Switch to skim or 1% milk instead of whole milk.  Focus on lean meats such as ground turkey, fresh fish, baked chicken, and lean cuts of beef as your source of dietary protein.  Limit saturated fat consumption to less than 10% of your daily caloric intake.  Limit trans fatty acid consumption primarily by limiting synthetic trans fats such as partially hydrogenated oils (Ex: fried fast foods).  Substitute olive or vegetable oil for solid fats where possible.  Moderation of Salt Intake: Provided you don't carry a diagnosis of congestive heart failure nor renal failure, I recommend a daily allowance of no more than 2300 mg of salt (sodium).  Keeping under this daily goal is associated with a decreased risk of cardiovascular events, creeping  above it can lead to elevated blood pressures and increases your risk of cardiovascular events.  Milligrams (mg) of salt is listed on all nutrition labels, and your daily intake can add up faster than you think.  Most canned and frozen dinners can pack in over half your daily salt allowance in one meal.    Lifestyle Health Risks: Certain lifestyle choices carry specific health risks.  As you may already know, tobacco use has been associated with increasing one's risk of cardiovascular disease, pulmonary disease, numerous cancers, among many other issues.  What you may not know is that there are medications and nicotine replacement strategies that can more than double your chances of successfully quitting.  I would be thrilled to help manage your quitting strategy if you currently use tobacco products.  When it comes to alcohol use, I've yet to find an "ideal" daily allowance.  Provided an individual does not have a medical condition that is exacerbated by alcohol consumption, general guidelines determine "safe drinking" as no more than two standard drinks for a man or no more than one standard drink for a male per day.  However, much debate still exists on whether any amount of alcohol consumption is technically "safe".  My general advice, keep alcohol consumption to a minimum for general health promotion.  If you or others believe that alcohol, tobacco, or recreational drug use is interfering with your life, I would be happy to provide confidential counseling regarding treatment options.  General "Over The Counter" Nutrition Advice: Postmenopausal women should aim for a daily calcium intake of 1200 mg, however a significant portion of this might already be   provided by diets including milk, yogurt, cheese, and other dairy products.  Vitamin D has been shown to help preserve bone density, prevent fatigue, and has even been shown to help reduce falls in the elderly.  Ensuring a daily intake of 800 Units of  Vitamin D is a good place to start to enjoy the above benefits, we can easily check your Vitamin D level to see if you'd potentially benefit from supplementation beyond 800 Units a day.  Folic Acid intake should be of particular concern to women of childbearing age.  Daily consumption of 400-800 mcg of Folic Acid is recommended to minimize the chance of spinal cord defects in a fetus should pregnancy occur.    For many adults, accidents still remain one of the most common culprits when it comes to cause of death.  Some of the simplest but most effective preventitive habits you can adopt include regular seatbelt use, proper helmet use, securing firearms, and regularly testing your smoke and carbon monoxide detectors.  Sean B. Hommel DO [facility] [address], [address] [place], [place] [address] Phone: [phone]  Testicular Self-Exam A self-examination of your testicles involves looking at and feeling your testicles for abnormal lumps or swelling. Several things can cause swelling, lumps, or pain in your testicles. Some of these causes are:  Injuries.  Inflammation.  Infection.  Accumulation of fluids around your testicle (hydrocele).  Twisted testicles (testicular torsion).  Testicular cancer. Self-examination of the testicles and groin areas may be advised if you are at risk for testicular cancer. Risks for testicular cancer include:  An undescended testicle (cryptorchidism).  A history of previous testicular cancer.  A family history of testicular cancer. The testicles are easiest to examine after warm baths or showers and are more difficult to examine when you are cold. This is because the muscles attached to the testicles retract and pull them up higher or into the abdomen. Follow these steps while you are standing:  Hold your penis away from your body.  Roll one testicle between your thumb and forefinger, feeling the entire testicle.  Roll the other  testicle between your thumb and forefinger, feeling the entire testicle. Feel for lumps, swelling, or discomfort. A normal testicle is egg shaped and feels firm. It is smooth and not tender. The spermatic cord can be felt as a firm spaghetti-like cord at the back of your testicle. It is also important to examine the crease between the front of your leg and your abdomen. Feel for any bumps that are tender. These could be enlarged lymph nodes.  Document Released: 03/10/2001 Document Revised: 08/04/2013 Document Reviewed: 05/24/2013 [employer] Patient Information 2015 [employer]. This information is not intended to replace advice given to you by your health care provider. Make sure you discuss any questions you have with your health care provider.  

## 2015-03-03 ENCOUNTER — Encounter (HOSPITAL_COMMUNITY): Payer: Self-pay

## 2015-03-03 ENCOUNTER — Emergency Department (HOSPITAL_COMMUNITY)
Admission: EM | Admit: 2015-03-03 | Discharge: 2015-03-03 | Disposition: A | Discharge status: Home / Self Care | Payer: PRIVATE HEALTH INSURANCE | Attending: Emergency Medicine | Admitting: Emergency Medicine

## 2015-03-03 DIAGNOSIS — Z79899 Other long term (current) drug therapy: Secondary | ICD-10-CM | POA: Diagnosis not present

## 2015-03-03 DIAGNOSIS — Z791 Long term (current) use of non-steroidal anti-inflammatories (NSAID): Secondary | ICD-10-CM | POA: Insufficient documentation

## 2015-03-03 DIAGNOSIS — M542 Cervicalgia: Secondary | ICD-10-CM | POA: Diagnosis present

## 2015-03-03 DIAGNOSIS — I10 Essential (primary) hypertension: Secondary | ICD-10-CM | POA: Diagnosis not present

## 2015-03-03 DIAGNOSIS — Z87891 Personal history of nicotine dependence: Secondary | ICD-10-CM | POA: Insufficient documentation

## 2015-03-03 DIAGNOSIS — M62838 Other muscle spasm: Secondary | ICD-10-CM | POA: Diagnosis not present

## 2015-03-03 MED ORDER — HYDROCODONE-ACETAMINOPHEN 5-325 MG PO TABS
2.0000 | ORAL_TABLET | Freq: Once | ORAL | Status: AC
  Administered 2015-03-03: 2 via ORAL
  Filled 2015-03-03: qty 2

## 2015-03-03 MED ORDER — LIDOCAINE 5 % EX PTCH
1.0000 | MEDICATED_PATCH | CUTANEOUS | Status: DC
  Administered 2015-03-03: 1 via TRANSDERMAL
  Filled 2015-03-03 (×2): qty 1

## 2015-03-03 MED ORDER — HYDROCODONE-ACETAMINOPHEN 5-325 MG PO TABS: 2.0000 | ORAL_TABLET | ORAL | Status: DC | PRN

## 2015-03-03 MED ORDER — LIDOCAINE 5 % EX PTCH: 1.0000 | MEDICATED_PATCH | CUTANEOUS | Status: DC

## 2015-03-03 MED ORDER — CYCLOBENZAPRINE HCL 10 MG PO TABS: 10.0000 mg | ORAL_TABLET | Freq: Two times a day (BID) | ORAL | Status: DC | PRN

## 2015-03-03 MED ORDER — CYCLOBENZAPRINE HCL 10 MG PO TABS
10.0000 mg | ORAL_TABLET | Freq: Once | ORAL | Status: AC
  Administered 2015-03-03: 10 mg via ORAL
  Filled 2015-03-03: qty 1

## 2015-03-03 MED ORDER — LISINOPRIL 20 MG PO TABS
20.0000 mg | ORAL_TABLET | Freq: Once | ORAL | Status: AC
  Administered 2015-03-03: 20 mg via ORAL
  Filled 2015-03-03: qty 1

## 2015-03-03 NOTE — ED Notes (Signed)
Pt complains of right sided neck pain since Wednesday night, no injury noted, pt states he's unable to move his head to that side

## 2015-03-03 NOTE — ED Provider Notes (Signed)
CSN: [id number]     Arrival date & time 03/03/15  0239 History   First MD Initiated Contact with Patient 03/03/15 [phone]     Chief Complaint  Patient presents with  . Neck Pain     (Consider location/radiation/quality/duration/timing/severity/associated sxs/prior Treatment) Patient is a 34 y.o. male presenting with musculoskeletal pain. The history is provided by the patient. No language interpreter was used.  Muscle Pain This is a recurrent problem. The current episode started yesterday. The problem occurs constantly. The problem has been unchanged. Associated symptoms include neck pain. Pertinent negatives include no abdominal pain, arthralgias, chest pain, chills, fatigue, fever, nausea, vomiting or weakness. Exacerbated by: movement and palpation. He has tried rest, relaxation, NSAIDs and lying down for the symptoms. The treatment provided no relief.    Past Medical History  Diagnosis Date  . Hypertension    History reviewed. No pertinent past surgical history. Family History  Problem Relation Age of Onset  . Hypertension Mother   . Cancer Mother     pancreas  . Hyperlipidemia Mother   . Hypertension Father   . Hyperlipidemia Father   . Heart disease Sister   . Diabetes Neg Hx   . Stroke Neg Hx    History  Substance Use Topics  . Smoking status: Former Smoker -- 0.25 packs/day for 10 years  . Smokeless tobacco: Never Used  . Alcohol Use: No    Review of Systems  Constitutional: Negative for fever, chills and fatigue.  HENT: Negative for trouble swallowing.   Eyes: Negative for visual disturbance.  Respiratory: Negative for shortness of breath.   Cardiovascular: Negative for chest pain and palpitations.  Gastrointestinal: Negative for nausea, vomiting, abdominal pain and diarrhea.  Genitourinary: Negative for dysuria and difficulty urinating.  Musculoskeletal: Positive for neck pain. Negative for arthralgias.  Skin: Negative for color change.  Neurological: Negative  for dizziness and weakness.  Psychiatric/Behavioral: Negative for dysphoric mood.      Allergies  Review of patient's allergies indicates no known allergies.  Home Medications   Prior to Admission medications   Medication Sig Start Date End Date Taking? Authorizing Provider  diclofenac (VOLTAREN) 50 MG EC tablet Take one tablet every 8 hours only as needed for pain, take with small snack. 06/07/14   Sean Hommel, DO  lisinopril (PRINIVIL,ZESTRIL) 20 MG tablet One tablet by mouth daily for blood pressure control. 06/07/14 06/07/15  Sean Hommel, DO   BP 152/110 mmHg  Pulse 69  Temp(Src) 98.8 F (37.1 C) (Oral)  Resp 18  Ht  (1.854 m)  Wt 315 lb (142.883 kg)  BMI 41.57 kg/m2  SpO2 93% Physical Exam  Constitutional: He is oriented to person, place, and time. He appears well-developed and well-nourished. No distress.  HENT:  Head: Normocephalic and atraumatic.  Eyes: Conjunctivae and EOM are normal.  Neck:  Limited ROM due to right cervical paraspinal pain.   Cardiovascular: Normal rate and regular rhythm.  Exam reveals no gallop and no friction rub.   No murmur heard. Pulmonary/Chest: Effort normal and breath sounds normal. He has no wheezes. He has no rales. He exhibits no tenderness.  Abdominal: Soft. He exhibits no distension. There is no tenderness. There is no rebound.  Musculoskeletal: Normal range of motion.  No midline spine tenderness to palpation. Right trapezius tenderness to palpation.   Neurological: He is alert and oriented to person, place, and time. Coordination normal.  Speech is goal-oriented. Moves limbs without ataxia.   Skin: Skin is warm and dry.  Psychiatric: He has a normal mood and affect. His behavior is normal.  Nursing note and vitals reviewed.   ED Course  Procedures (including critical care time) Labs Review Labs Reviewed - No data to display  Imaging Review No results found.   EKG Interpretation None      MDM   Final diagnoses:   Muscle spasms of neck    4:23 AM Patient having severe muscle spasms of right trapezius. Vitals stable and patient afebrile. Patient will have lidocaine patch, vicodin, and flexeril.     [address] [place], PA-C 03/03/15 0454  Azalia Bilis, MD 03/03/15 [phone]

## 2015-03-03 NOTE — Discharge Instructions (Signed)
Take Vicodin as needed for pain. Take Flexeril as needed for muscle spasm. Use lidocaine patches as needed. Refer to attached documents for more information.

## 2015-03-07 ENCOUNTER — Emergency Department (HOSPITAL_COMMUNITY): Payer: PRIVATE HEALTH INSURANCE

## 2015-03-07 ENCOUNTER — Encounter (HOSPITAL_COMMUNITY): Payer: Self-pay | Admitting: Emergency Medicine

## 2015-03-07 ENCOUNTER — Emergency Department (HOSPITAL_COMMUNITY)
Admission: EM | Admit: 2015-03-07 | Discharge: 2015-03-07 | Disposition: A | Discharge status: Home / Self Care | Payer: PRIVATE HEALTH INSURANCE | Attending: Emergency Medicine | Admitting: Emergency Medicine

## 2015-03-07 DIAGNOSIS — Z87891 Personal history of nicotine dependence: Secondary | ICD-10-CM | POA: Diagnosis not present

## 2015-03-07 DIAGNOSIS — R69 Illness, unspecified: Secondary | ICD-10-CM

## 2015-03-07 DIAGNOSIS — I10 Essential (primary) hypertension: Secondary | ICD-10-CM | POA: Insufficient documentation

## 2015-03-07 DIAGNOSIS — Z79899 Other long term (current) drug therapy: Secondary | ICD-10-CM | POA: Diagnosis not present

## 2015-03-07 DIAGNOSIS — R05 Cough: Secondary | ICD-10-CM | POA: Diagnosis present

## 2015-03-07 DIAGNOSIS — J111 Influenza due to unidentified influenza virus with other respiratory manifestations: Secondary | ICD-10-CM | POA: Insufficient documentation

## 2015-03-07 LAB — CBC
HCT: 46.8 % (ref 39.0–52.0)
Hemoglobin: 15.3 g/dL (ref 13.0–17.0)
MCH: 28 pg (ref 26.0–34.0)
MCHC: 32.7 g/dL (ref 30.0–36.0)
MCV: 85.7 fL (ref 78.0–100.0)
Platelets: 225 10*3/uL (ref 150–400)
RBC: 5.46 MIL/uL (ref 4.22–5.81)
RDW: 13.8 % (ref 11.5–15.5)
WBC: 9 10*3/uL (ref 4.0–10.5)

## 2015-03-07 LAB — BASIC METABOLIC PANEL
Anion gap: 10 (ref 5–15)
BUN: 10 mg/dL (ref 6–23)
CO2: 22 mmol/L (ref 19–32)
Calcium: 8.9 mg/dL (ref 8.4–10.5)
Chloride: 105 mmol/L (ref 96–112)
Creatinine, Ser: 1.06 mg/dL (ref 0.50–1.35)
GFR calc Af Amer: >90 mL/min (ref >90)
Glucose, Bld: 89 mg/dL (ref 70–99)
POTASSIUM: 4 mmol/L (ref 3.5–5.1)
SODIUM: 137 mmol/L (ref 135–145)

## 2015-03-07 MED ORDER — OSELTAMIVIR PHOSPHATE 75 MG PO CAPS: 75.0000 mg | ORAL_CAPSULE | Freq: Two times a day (BID) | ORAL | Status: DC

## 2015-03-07 MED ORDER — KETOROLAC TROMETHAMINE 30 MG/ML IJ SOLN
30.0000 mg | Freq: Once | INTRAMUSCULAR | Status: AC
  Administered 2015-03-07: 30 mg via INTRAVENOUS
  Filled 2015-03-07: qty 1

## 2015-03-07 NOTE — Discharge Instructions (Signed)
Please call your doctor for a followup appointment within 24-48 hours. When you talk to your doctor please let them know that you were seen in the emergency department and have them acquire all of your records so that they can discuss the findings with you and formulate a treatment plan to fully care for your new and ongoing problems. ° °

## 2015-03-07 NOTE — ED Notes (Signed)
Pt reports weakness, dizziness, chills, HA and diarrhea that started this am. No emesis. Began to have productive cough last night.

## 2015-03-07 NOTE — ED Provider Notes (Signed)
CSN: [id number]     Arrival date & time 03/07/15  1802 History   First MD Initiated Contact with Patient 03/07/15 1937     Chief Complaint  Patient presents with  . Fatigue  . Cough     (Consider location/radiation/quality/duration/timing/severity/associated sxs/prior Treatment) HPI Comments: The patient is a 34 year old male, he denies any past medical history, he presents with a complaint of a cough, body aches and general fatigue that started last night and got worse this morning. He hurts in all of his muscles including arms and legs back neck and behind his eyes. He has had little appetite but has been able to tolerate food without vomiting. He had one episode of watery diarrhea earlier in the day but no blood, no swelling, no rashes, no dysuria, no high fevers or productive cough. The patient did not get a flu shot this year, denies any other associated sick contacts.  Patient is a 34 y.o. male presenting with cough. The history is provided by the patient.  Cough   Past Medical History  Diagnosis Date  . Hypertension    History reviewed. No pertinent past surgical history. Family History  Problem Relation Age of Onset  . Hypertension Mother   . Cancer Mother     pancreas  . Hyperlipidemia Mother   . Hypertension Father   . Hyperlipidemia Father   . Heart disease Sister   . Diabetes Neg Hx   . Stroke Neg Hx    History  Substance Use Topics  . Smoking status: Former Smoker -- 0.25 packs/day for 10 years  . Smokeless tobacco: Never Used  . Alcohol Use: No    Review of Systems  Respiratory: Positive for cough.   All other systems reviewed and are negative.     Allergies  Review of patient's allergies indicates no known allergies.  Home Medications   Prior to Admission medications   Medication Sig Start Date End Date Taking? Authorizing Provider  cyclobenzaprine (FLEXERIL) 10 MG tablet Take 1 tablet (10 mg total) by mouth 2 (two) times daily as needed for  muscle spasms. 03/03/15  Yes Emilia Beck, PA-C  HYDROcodone-acetaminophen (NORCO/VICODIN) 5-325 MG per tablet Take 2 tablets by mouth every 4 (four) hours as needed. 03/03/15  Yes Kaitlyn Szekalski, PA-C  lidocaine (LIDODERM) 5 % Place 1 patch onto the skin daily. Remove & Discard patch within 12 hours or as directed by MD 03/03/15  Yes Emilia Beck, PA-C  lisinopril (PRINIVIL,ZESTRIL) 20 MG tablet One tablet by mouth daily for blood pressure control. Patient taking differently: Take 20 mg by mouth daily.  06/07/14 06/07/15 Yes Sean Hommel, DO  diclofenac (VOLTAREN) 50 MG EC tablet Take one tablet every 8 hours only as needed for pain, take with small snack. 06/07/14   Laren Boom, DO  oseltamivir (TAMIFLU) 75 MG capsule Take 1 capsule (75 mg total) by mouth every 12 (twelve) hours. 03/07/15   Eber Hong, MD   BP 152/73 mmHg  Pulse 93  Temp(Src) 100.8 F (38.2 C) (Oral)  Resp 18  SpO2 98% Physical Exam  Constitutional: He appears well-developed and well-nourished. No distress.  HENT:  Head: Normocephalic and atraumatic.  Mouth/Throat: Oropharynx is clear and moist. No oropharyngeal exudate.  Eyes: Conjunctivae and EOM are normal. Pupils are equal, round, and reactive to light. Right eye exhibits no discharge. Left eye exhibits no discharge. No scleral icterus.  Neck: Normal range of motion. Neck supple. No JVD present. No thyromegaly present.  Cardiovascular: Normal rate, regular rhythm,  normal heart sounds and intact distal pulses.  Exam reveals no gallop and no friction rub.   No murmur heard. Pulmonary/Chest: Effort normal and breath sounds normal. No respiratory distress. He has no wheezes. He has no rales.  Abdominal: Soft. Bowel sounds are normal. He exhibits no distension and no mass. There is no tenderness.  Musculoskeletal: Normal range of motion. He exhibits no edema or tenderness.  Lymphadenopathy:    He has no cervical adenopathy.  Neurological: He is alert. Coordination  normal.  Skin: Skin is warm and dry. No rash noted. No erythema.  Psychiatric: He has a normal mood and affect. His behavior is normal.  Nursing note and vitals reviewed.   ED Course  Procedures (including critical care time) Labs Review Labs Reviewed  CBC  BASIC METABOLIC PANEL    Imaging Review Dg Chest 2 View (if Patient Has Fever And/or Copd)  03/07/2015   CLINICAL DATA:  Acute onset of productive cough, shortness of breath and fatigue. Initial encounter.  EXAM: CHEST  2 VIEW  COMPARISON:  Chest radiograph performed 06/01/2011  FINDINGS: The lungs are well-aerated and clear. There is no evidence of focal opacification, pleural effusion or pneumothorax.  The heart is normal in size; the mediastinal contour is within normal limits. No acute osseous abnormalities are seen.  IMPRESSION: No acute cardiopulmonary process seen.   Electronically Signed   By: Roanna Raider M.D.   On: 03/07/2015 18:27      MDM   Final diagnoses:  Influenza-like illness    The patient does have medication for his hypertension, his vital signs reflect a possible viral illness, x-ray shows no pneumonia, no leukocytosis, he is otherwise well-appearing. He will get Toradol, prescription for Tamiflu. The patient is in agreement with this plan. Labs normal - stable for d/c.  Fever here - toradol given.  Meds given in ED:  Medications  ketorolac (TORADOL) 30 MG/ML injection 30 mg (30 mg Intravenous Given 03/07/15 1949)    New Prescriptions   OSELTAMIVIR (TAMIFLU) 75 MG CAPSULE    Take 1 capsule (75 mg total) by mouth every 12 (twelve) hours.        Eber Hong, MD 03/07/15 2015

## 2015-04-03 ENCOUNTER — Emergency Department (HOSPITAL_COMMUNITY)
Admission: EM | Admit: 2015-04-03 | Discharge: 2015-04-03 | Disposition: A | Discharge status: Home / Self Care | Payer: PRIVATE HEALTH INSURANCE | Attending: Emergency Medicine | Admitting: Emergency Medicine

## 2015-04-03 ENCOUNTER — Encounter (HOSPITAL_COMMUNITY): Payer: Self-pay | Admitting: Emergency Medicine

## 2015-04-03 DIAGNOSIS — Z79899 Other long term (current) drug therapy: Secondary | ICD-10-CM | POA: Insufficient documentation

## 2015-04-03 DIAGNOSIS — I10 Essential (primary) hypertension: Secondary | ICD-10-CM | POA: Insufficient documentation

## 2015-04-03 DIAGNOSIS — Z791 Long term (current) use of non-steroidal anti-inflammatories (NSAID): Secondary | ICD-10-CM | POA: Diagnosis not present

## 2015-04-03 DIAGNOSIS — Z87891 Personal history of nicotine dependence: Secondary | ICD-10-CM | POA: Diagnosis not present

## 2015-04-03 DIAGNOSIS — M546 Pain in thoracic spine: Secondary | ICD-10-CM | POA: Insufficient documentation

## 2015-04-03 MED ORDER — NAPROXEN 500 MG PO TABS: 500.0000 mg | ORAL_TABLET | Freq: Two times a day (BID) | ORAL | Status: DC

## 2015-04-03 MED ORDER — METHOCARBAMOL 500 MG PO TABS: 500.0000 mg | ORAL_TABLET | Freq: Two times a day (BID) | ORAL | Status: DC

## 2015-04-03 NOTE — ED Notes (Signed)
PA at bedside.

## 2015-04-03 NOTE — Discharge Instructions (Signed)
Take muscle relaxer as needed.  Do not drive or operate heavy machinery for 4-6 hours after taking muscle relaxer.

## 2015-04-03 NOTE — ED Provider Notes (Signed)
CSN: [id number]     Arrival date & time 04/03/15  4098 History   First MD Initiated Contact with Patient 04/03/15 0915     Chief Complaint  Patient presents with  . Back Pain     (Consider location/radiation/quality/duration/timing/severity/associated sxs/prior Treatment) HPI Comments: Patient presents today with lower thoracic back pain.  Pain has been present since he was swing his 33 year old niece around this morning.  Pain has been constant since that time.  Pain worse with movement.  He tried taking Ibuprofen for the pain, but does not feel that it helped.  Pain is located in the lower thoracic area and does not radiate.  He denies numbness, tingling, fever, chills, abdominal pain, chest pain, or bowel/bladder incontinence.  No history of Cancer or IVDU.    Patient is a 34 y.o. male presenting with back pain. The history is provided by the patient.  Back Pain   Past Medical History  Diagnosis Date  . Hypertension    History reviewed. No pertinent past surgical history. Family History  Problem Relation Age of Onset  . Hypertension Mother   . Cancer Mother     pancreas  . Hyperlipidemia Mother   . Hypertension Father   . Hyperlipidemia Father   . Heart disease Sister   . Diabetes Neg Hx   . Stroke Neg Hx    History  Substance Use Topics  . Smoking status: Former Smoker -- 0.25 packs/day for 10 years  . Smokeless tobacco: Never Used  . Alcohol Use: No    Review of Systems  Musculoskeletal: Positive for back pain.  All other systems reviewed and are negative.     Allergies  Review of patient's allergies indicates no known allergies.  Home Medications   Prior to Admission medications   Medication Sig Start Date End Date Taking? Authorizing Provider  cyclobenzaprine (FLEXERIL) 10 MG tablet Take 1 tablet (10 mg total) by mouth 2 (two) times daily as needed for muscle spasms. 03/03/15   Kaitlyn Szekalski, PA-C  diclofenac (VOLTAREN) 50 MG EC tablet Take one tablet  every 8 hours only as needed for pain, take with small snack. 06/07/14   Laren Boom, DO  HYDROcodone-acetaminophen (NORCO/VICODIN) 5-325 MG per tablet Take 2 tablets by mouth every 4 (four) hours as needed. 03/03/15   Kaitlyn Szekalski, PA-C  lidocaine (LIDODERM) 5 % Place 1 patch onto the skin daily. Remove & Discard patch within 12 hours or as directed by MD 03/03/15   Emilia Beck, PA-C  lisinopril (PRINIVIL,ZESTRIL) 20 MG tablet One tablet by mouth daily for blood pressure control. Patient taking differently: Take 20 mg by mouth daily.  06/07/14 06/07/15  Laren Boom, DO  oseltamivir (TAMIFLU) 75 MG capsule Take 1 capsule (75 mg total) by mouth every 12 (twelve) hours. 03/07/15   Eber Hong, MD   BP 139/90 mmHg  Pulse 74  Temp(Src) 97.8 F (36.6 C) (Oral)  Resp 16  SpO2 100% Physical Exam  Constitutional: He appears well-developed and well-nourished.  HENT:  Head: Normocephalic and atraumatic.  Mouth/Throat: Oropharynx is clear and moist.  Neck: Normal range of motion. Neck supple.  Cardiovascular: Normal rate, regular rhythm and normal heart sounds.   Pulmonary/Chest: Effort normal and breath sounds normal.  Musculoskeletal:       Thoracic back: He exhibits tenderness and bony tenderness. He exhibits normal range of motion, no swelling, no edema and no deformity.  Increased pain with ROM of the thoracic spine.  Thoracic paraspinal tenderness to palpation  Neurological: He is alert. He has normal strength. No sensory deficit. Gait normal.  Reflex Scores:      Patellar reflexes are 2+ on the right side and 2+ on the left side. Distal sensation of both feet intact Muscle strength 5/5 of lower extremities bilaterally  Skin: Skin is warm and dry.  Psychiatric: He has a normal mood and affect.  Nursing note and vitals reviewed.   ED Course  Procedures (including critical care time) Labs Review Labs Reviewed - No data to display  Imaging Review No results found.   EKG  Interpretation None      MDM   Final diagnoses:  None   Patient with back pain that came after swing his niece around this morning.  Pain worse with movement.  Feel that the pain is most likely musculoskeletal.  No neurological deficits and normal neuro exam.  Patient can walk but states is painful.  No loss of bowel or bladder control.  No concern for cauda equina.  No fever, night sweats, weight loss, h/o cancer, IVDU.  Feel that the patient is stable for discharge.  Return precautions given.       Santiago Glad, PA-C 04/03/15 4235  Doug Sou, MD 04/03/15 [phone]

## 2015-04-03 NOTE — ED Notes (Signed)
Pt states that this morning he was swinging his niece in the air when he came down and up felt his back give out. Pt states more mid-upper.

## 2015-05-30 ENCOUNTER — Other Ambulatory Visit: Payer: Self-pay | Admitting: Family Medicine

## 2015-06-12 ENCOUNTER — Ambulatory Visit (INDEPENDENT_AMBULATORY_CARE_PROVIDER_SITE_OTHER): Payer: PRIVATE HEALTH INSURANCE | Admitting: Family Medicine

## 2015-06-12 ENCOUNTER — Encounter: Payer: Self-pay | Admitting: Family Medicine

## 2015-06-12 VITALS — BP 148/101 | HR 78 | Ht 72.0 in | Wt 319.0 lb

## 2015-06-12 DIAGNOSIS — I10 Essential (primary) hypertension: Secondary | ICD-10-CM

## 2015-06-12 DIAGNOSIS — Z0000 Encounter for general adult medical examination without abnormal findings: Secondary | ICD-10-CM | POA: Diagnosis not present

## 2015-06-12 DIAGNOSIS — M6740 Ganglion, unspecified site: Secondary | ICD-10-CM | POA: Diagnosis not present

## 2015-06-12 MED ORDER — LISINOPRIL 20 MG PO TABS: ORAL_TABLET | ORAL | Status: DC

## 2015-06-12 NOTE — Patient Instructions (Signed)
Dr. Sharah Finnell's General Advice Following Your Complete Physical Exam  The Benefits of Regular Exercise: Unless you suffer from an uncontrolled cardiovascular condition, studies strongly suggest that regular exercise and physical activity will add to both the quality and length of your life.  The World Health Organization recommends 150 minutes of moderate intensity aerobic activity every week.  This is best split over 3-4 days a week, and can be as simple as a brisk walk for just over 35 minutes "most days of the week".  This type of exercise has been shown to lower LDL-Cholesterol, lower average blood sugars, lower blood pressure, lower cardiovascular disease risk, improve memory, and increase one's overall sense of wellbeing.  The addition of anaerobic (or "strength training") exercises offers additional benefits including but not limited to increased metabolism, prevention of osteoporosis, and improved overall cholesterol levels.  How Can I Strive For A Low-Fat Diet?: Current guidelines recommend that 25-35 percent of your daily energy (food) intake should come from fats.  One might ask how can this be achieved without having to dissect each meal on a daily basis?  Switch to skim or 1% milk instead of whole milk.  Focus on lean meats such as ground turkey, fresh fish, baked chicken, and lean cuts of beef as your source of dietary protein.  Limit saturated fat consumption to less than 10% of your daily caloric intake.  Limit trans fatty acid consumption primarily by limiting synthetic trans fats such as partially hydrogenated oils (Ex: fried fast foods).  Substitute olive or vegetable oil for solid fats where possible.  Moderation of Salt Intake: Provided you don't carry a diagnosis of congestive heart failure nor renal failure, I recommend a daily allowance of no more than 2300 mg of salt (sodium).  Keeping under this daily goal is associated with a decreased risk of cardiovascular events, creeping  above it can lead to elevated blood pressures and increases your risk of cardiovascular events.  Milligrams (mg) of salt is listed on all nutrition labels, and your daily intake can add up faster than you think.  Most canned and frozen dinners can pack in over half your daily salt allowance in one meal.    Lifestyle Health Risks: Certain lifestyle choices carry specific health risks.  As you may already know, tobacco use has been associated with increasing one's risk of cardiovascular disease, pulmonary disease, numerous cancers, among many other issues.  What you may not know is that there are medications and nicotine replacement strategies that can more than double your chances of successfully quitting.  I would be thrilled to help manage your quitting strategy if you currently use tobacco products.  When it comes to alcohol use, I've yet to find an "ideal" daily allowance.  Provided an individual does not have a medical condition that is exacerbated by alcohol consumption, general guidelines determine "safe drinking" as no more than two standard drinks for a man or no more than one standard drink for a male per day.  However, much debate still exists on whether any amount of alcohol consumption is technically "safe".  My general advice, keep alcohol consumption to a minimum for general health promotion.  If you or others believe that alcohol, tobacco, or recreational drug use is interfering with your life, I would be happy to provide confidential counseling regarding treatment options.  General "Over The Counter" Nutrition Advice: Postmenopausal women should aim for a daily calcium intake of 1200 mg, however a significant portion of this might already be   provided by diets including milk, yogurt, cheese, and other dairy products.  Vitamin D has been shown to help preserve bone density, prevent fatigue, and has even been shown to help reduce falls in the elderly.  Ensuring a daily intake of 800 Units of  Vitamin D is a good place to start to enjoy the above benefits, we can easily check your Vitamin D level to see if you'd potentially benefit from supplementation beyond 800 Units a day.  Folic Acid intake should be of particular concern to women of childbearing age.  Daily consumption of 400-800 mcg of Folic Acid is recommended to minimize the chance of spinal cord defects in a fetus should pregnancy occur.    For many adults, accidents still remain one of the most common culprits when it comes to cause of death.  Some of the simplest but most effective preventitive habits you can adopt include regular seatbelt use, proper helmet use, securing firearms, and regularly testing your smoke and carbon monoxide detectors.  Arius Harnois B. Tonatiuh Mallon DO [facility] [address], [address] [place], [place] [address] Phone: [phone]  

## 2015-06-12 NOTE — Progress Notes (Signed)
CC: Henry Chandler is a 34 y.o. male is here for Annual Exam   Subjective: HPI:  No family history of colon cancer or prostate cancer, counseled to begin screening at age 67  Influenza Vaccine: No current indication Pneumovax: No current indication Td/Tdap: UTD as of 2015 Zoster: (Start 33 yo)  Requesting complete physical exam and ran out of lisinopril many months ago. He denies any known side effects.   Review of Systems - General ROS: negative for - chills, fever, night sweats, weight gain or weight loss Ophthalmic ROS: negative for - decreased vision Psychological ROS: negative for - anxiety or depression ENT ROS: negative for - hearing change, nasal congestion, tinnitus or allergies Hematological and Lymphatic ROS: negative for - bleeding problems, bruising or swollen lymph nodes Breast ROS: negative Respiratory ROS: no cough, shortness of breath, or wheezing Cardiovascular ROS: no chest pain or dyspnea on exertion Gastrointestinal ROS: no abdominal pain, change in bowel habits, or black or bloody stools Genito-Urinary ROS: negative for - genital discharge, genital ulcers, incontinence or abnormal bleeding from genitals Musculoskeletal ROS: negative for - joint pain or muscle pain other than flank pain when leaning over forward for an extended period of time such as a few minutes. Neurological ROS: negative for - headaches or memory loss Dermatological ROS: negative for lumps, mole changes, rash and skin lesion changes  Past Medical History  Diagnosis Date  . Hypertension     No past surgical history on file. Family History  Problem Relation Age of Onset  . Hypertension Mother   . Cancer Mother     pancreas  . Hyperlipidemia Mother   . Hypertension Father   . Hyperlipidemia Father   . Heart disease Sister   . Diabetes Neg Hx   . Stroke Neg Hx     History   Social History  . Marital Status: Single    Spouse Name: N/A  . Number of Children: N/A  . Years of  Education: GED   Occupational History  . Forklift operator    Social History Main Topics  . Smoking status: Former Smoker -- 0.25 packs/day for 10 years  . Smokeless tobacco: Never Used  . Alcohol Use: No  . Drug Use: No  . Sexual Activity: Yes    Birth Control/ Protection: Pill     Comment: 1 or less daly   Other Topics Concern  . Not on file   Social History Narrative   Regular exercise-yes   Caffeine Use-yes     Objective: BP 148/101 mmHg  Pulse 78  Ht 6' (1.829 m)  Wt 319 lb (144.697 kg)  BMI 43.25 kg/m2  General: No Acute Distress HEENT: Atraumatic, normocephalic, conjunctivae normal without scleral icterus.  No nasal discharge, hearing grossly intact, TMs with good landmarks bilaterally with no middle ear abnormalities, posterior pharynx clear without oral lesions. Neck: Supple, trachea midline, no cervical nor supraclavicular adenopathy. Pulmonary: Clear to auscultation bilaterally without wheezing, rhonchi, nor rales. Cardiac: Regular rate and rhythm.  No murmurs, rubs, nor gallops. No peripheral edema.  2+ peripheral pulses bilaterally. Abdomen: Bowel sounds normal.  No masses.  Non-tender without rebound.  Negative Murphy's sign. GU: Bilateral descended testes without inguinal hernia  MSK: Grossly intact, no signs of weakness.  Full strength throughout upper and lower extremities.  Full ROM in upper and lower extremities.  No midline spinal tenderness. Pain is localized just below the scapula bilaterally. Painless ganglion cyst on the left dorsal wrist Neuro: Gait unremarkable, CN II-XII grossly  intact.  C5-C6 Reflex 2/4 Bilaterally, L4 Reflex 2/4 Bilaterally.  Cerebellar function intact. Skin: No rashes. Psych: Alert and oriented to person/place/time.  Thought process normal. No anxiety/depression.  Assessment & Plan: Henry Chandler was seen today for annual exam.  Diagnoses and all orders for this visit:  Physical exam, annual Orders: -     COMPLETE METABOLIC PANEL  WITH GFR -     CBC -     Lipid panel  Essential hypertension, benign Orders: -     lisinopril (PRINIVIL,ZESTRIL) 20 MG tablet; One tablet by mouth daily for blood pressure control.  Ganglion cyst   Healthy lifestyle interventions including but not limited to regular exercise, a healthy low fat diet, moderation of salt intake, the dangers of tobacco/alcohol/recreational drug use, nutrition supplementation, and accident avoidance were discussed with the patient and a handout was provided for future reference. Restart lisinopril for blood pressure control and return in one to 3 months to ensure this at is at goal. Discussed benign nature of the ganglion cyst on his left wrist and that if he would like he can see my partner for consideration of drainage it becomes painful.  Return in about 3 months (around 09/12/2015) for Blood Pressure Review.

## 2015-06-13 ENCOUNTER — Telehealth: Payer: Self-pay | Admitting: Family Medicine

## 2015-06-13 DIAGNOSIS — R739 Hyperglycemia, unspecified: Secondary | ICD-10-CM | POA: Insufficient documentation

## 2015-06-13 LAB — LIPID PANEL
Cholesterol: 111 mg/dL (ref 0–200)
HDL: 37 mg/dL — ABNORMAL LOW (ref >=40)
LDL CALC: 54 mg/dL (ref 0–99)
Total CHOL/HDL Ratio: 3 Ratio
Triglycerides: 98 mg/dL (ref <150)
VLDL: 20 mg/dL (ref 0–40)

## 2015-06-13 LAB — COMPLETE METABOLIC PANEL WITH GFR
ALT: 27 U/L (ref 0–53)
AST: 20 U/L (ref 0–37)
Albumin: 4.2 g/dL (ref 3.5–5.2)
Alkaline Phosphatase: 77 U/L (ref 39–117)
BILIRUBIN TOTAL: 0.4 mg/dL (ref 0.2–1.2)
BUN: 11 mg/dL (ref 6–23)
CALCIUM: 9.4 mg/dL (ref 8.4–10.5)
CHLORIDE: 106 meq/L (ref 96–112)
CO2: 24 mEq/L (ref 19–32)
CREATININE: 0.99 mg/dL (ref 0.50–1.35)
GFR, Est African American: >89 mL/min
GFR, Est Non African American: >89 mL/min
Glucose, Bld: 100 mg/dL — ABNORMAL HIGH (ref 70–99)
Potassium: 4 mEq/L (ref 3.5–5.3)
Sodium: 139 mEq/L (ref 135–145)
Total Protein: 7.6 g/dL (ref 6.0–8.3)

## 2015-06-13 LAB — CBC
HCT: 44.1 % (ref 39.0–52.0)
Hemoglobin: 15.5 g/dL (ref 13.0–17.0)
MCH: 28.4 pg (ref 26.0–34.0)
MCHC: 35.1 g/dL (ref 30.0–36.0)
MCV: 80.8 fL (ref 78.0–100.0)
MPV: 9 fL (ref 8.6–12.4)
Platelets: 271 10*3/uL (ref 150–400)
RBC: 5.46 MIL/uL (ref 4.22–5.81)
RDW: 15.3 % (ref 11.5–15.5)
WBC: 7.2 10*3/uL (ref 4.0–10.5)

## 2015-06-13 NOTE — Telephone Encounter (Signed)
Sue Lush, Will you please let patient know that his kidney function, liver function, LDL cholesterol and blood cell counts were normal. His HDL was slightly low and the higher the better, This can be improved with engaging in 30-45 minutes of moderate exercise most days of the week. His blood sugar was just one point above normal and I'd recommend he have a 3 month average blood sugar checked sometime in the near future.  Lab slip is in your inbox.

## 2015-06-13 NOTE — Telephone Encounter (Signed)
Left message on vm

## 2015-06-27 ENCOUNTER — Other Ambulatory Visit (HOSPITAL_COMMUNITY)
Admission: RE | Admit: 2015-06-27 | Discharge: 2015-06-27 | Disposition: A | Discharge status: Home / Self Care | Payer: PRIVATE HEALTH INSURANCE | Source: Ambulatory Visit | Attending: Family Medicine | Admitting: Family Medicine

## 2015-06-27 ENCOUNTER — Ambulatory Visit (INDEPENDENT_AMBULATORY_CARE_PROVIDER_SITE_OTHER): Payer: PRIVATE HEALTH INSURANCE | Admitting: Family Medicine

## 2015-06-27 ENCOUNTER — Encounter: Payer: Self-pay | Admitting: Family Medicine

## 2015-06-27 DIAGNOSIS — N342 Other urethritis: Secondary | ICD-10-CM | POA: Insufficient documentation

## 2015-06-27 DIAGNOSIS — Z113 Encounter for screening for infections with a predominantly sexual mode of transmission: Secondary | ICD-10-CM | POA: Insufficient documentation

## 2015-06-27 MED ORDER — CEFTRIAXONE SODIUM 250 MG IJ SOLR
250.0000 mg | Freq: Once | INTRAMUSCULAR | Status: AC
  Administered 2015-06-27: 250 mg via INTRAMUSCULAR

## 2015-06-27 MED ORDER — AZITHROMYCIN 250 MG PO TABS
1000.0000 mg | ORAL_TABLET | Freq: Once | ORAL | Status: AC
  Administered 2015-06-27: 1000 mg via ORAL

## 2015-06-27 NOTE — Patient Instructions (Signed)
I will call if anything comes up with labs Please take your blood pressure medicine. Follow-up with your primary care doctor

## 2015-06-27 NOTE — Assessment & Plan Note (Addendum)
Urethritis.  New Problem moderate complexity. Empiric treatment with ceftriaxone 250 mg IM and azithromycin 1 g by mouth Urine testing for gonorrhea, Chlamydia, and trichomonas pending. Serology for HIV and syphilis pending.

## 2015-06-27 NOTE — Assessment & Plan Note (Signed)
Patient did not take his blood pressure medicine this morning. We'll recheck blood pressure when he follows up with his PCP. No change to this time.

## 2015-06-27 NOTE — Progress Notes (Signed)
Henry Chandler is a 34 y.o. male who presents to [facility]  today for Penile discharge. Patient has a 1.5 day history of yellowish greenish penile discharge change with burning with urination. He denies any pelvic or abdominal pain fevers chills nausea vomiting or diarrhea. He has not had any treatment. His sexual partner has vaginal discharge currently.  Patient notes that he did not take his blood pressure medicine this morning. He denies any chest pains or palpitations or shortness of breath.  Past Medical History  Diagnosis Date  . Hypertension    History reviewed. No pertinent past surgical history. History  Substance Use Topics  . Smoking status: Former Smoker -- 0.25 packs/day for 10 years  . Smokeless tobacco: Never Used  . Alcohol Use: No   ROS as above Medications: Current Outpatient Prescriptions  Medication Sig Dispense Refill  . lisinopril (PRINIVIL,ZESTRIL) 20 MG tablet One tablet by mouth daily for blood pressure control. 90 tablet 3   Current Facility-Administered Medications  Medication Dose Route Frequency Provider Last Rate Last Dose  . azithromycin (ZITHROMAX) tablet 1,000 mg  1,000 mg Oral Once Rodolph Bong, MD      . cefTRIAXone (ROCEPHIN) injection 250 mg  250 mg Intramuscular Once Rodolph Bong, MD       No Known Allergies   Exam:  BP 161/109 mmHg  Pulse 75  Ht 6' (1.829 m)  Wt 316 lb (143.337 kg)  BMI 42.85 kg/m2 Gen: Well NAD HEENT: EOMI,  MMM Lungs: Normal work of breathing. CTABL Heart: RRR no MRG Abd: NABS, Soft. Nondistended, Nontender Exts: Brisk capillary refill, warm and well perfused.  Genitals: No inguinal lymphadenopathy. Testicles are descended bilaterally and nontender with no masses. Penis is circumcised and normal appearing without lesions. Minimal discharge visible.  No results found for this or any previous visit (from the past 24 hour(s)). No results found.   Please see individual assessment  and plan sections. This visit required moderate complexity and decision making.

## 2015-06-28 LAB — HIV ANTIBODY (ROUTINE TESTING W REFLEX): HIV 1&2 Ab, 4th Generation: NONREACTIVE

## 2015-06-28 LAB — RPR

## 2015-06-29 LAB — URINE CYTOLOGY ANCILLARY ONLY: Trichomonas: NEGATIVE

## 2015-06-29 LAB — GC/CHLAMYDIA PROBE AMP
CT PROBE, AMP APTIMA: NEGATIVE
GC PROBE AMP APTIMA: POSITIVE — AB

## 2015-07-11 ENCOUNTER — Telehealth: Payer: Self-pay

## 2015-07-11 NOTE — Telephone Encounter (Signed)
Called and left detailed VM with brandy from the [facility] department to notify her that pt has been notified and treated for gonorrhea. Faxed CDC form as well.

## 2015-10-23 ENCOUNTER — Emergency Department (HOSPITAL_COMMUNITY)
Admission: EM | Admit: 2015-10-23 | Discharge: 2015-10-24 | Disposition: A | Discharge status: Home / Self Care | Payer: PRIVATE HEALTH INSURANCE | Attending: Emergency Medicine | Admitting: Emergency Medicine

## 2015-10-23 ENCOUNTER — Encounter (HOSPITAL_COMMUNITY): Payer: Self-pay | Admitting: *Deleted

## 2015-10-23 DIAGNOSIS — I10 Essential (primary) hypertension: Secondary | ICD-10-CM | POA: Diagnosis not present

## 2015-10-23 DIAGNOSIS — H53149 Visual discomfort, unspecified: Secondary | ICD-10-CM | POA: Diagnosis not present

## 2015-10-23 DIAGNOSIS — R51 Headache: Secondary | ICD-10-CM | POA: Insufficient documentation

## 2015-10-23 DIAGNOSIS — R42 Dizziness and giddiness: Secondary | ICD-10-CM | POA: Insufficient documentation

## 2015-10-23 DIAGNOSIS — Z87891 Personal history of nicotine dependence: Secondary | ICD-10-CM | POA: Diagnosis not present

## 2015-10-23 DIAGNOSIS — R110 Nausea: Secondary | ICD-10-CM | POA: Insufficient documentation

## 2015-10-23 DIAGNOSIS — R519 Headache, unspecified: Secondary | ICD-10-CM

## 2015-10-23 NOTE — ED Notes (Signed)
Pt states he woke up this morning at 10AM with headache, dizziness, and nausea. Pt states he has not thrown up today. Pt states he has had headaches in the past but not this bad. Pt states he tried taking tylenol without relief.

## 2015-10-24 ENCOUNTER — Emergency Department (HOSPITAL_COMMUNITY): Payer: PRIVATE HEALTH INSURANCE

## 2015-10-24 MED ORDER — DIPHENHYDRAMINE HCL 50 MG/ML IJ SOLN
25.0000 mg | Freq: Once | INTRAMUSCULAR | Status: AC
  Administered 2015-10-24: 25 mg via INTRAVENOUS
  Filled 2015-10-24: qty 1

## 2015-10-24 MED ORDER — SODIUM CHLORIDE 0.9 % IV BOLUS (SEPSIS)
1000.0000 mL | Freq: Once | INTRAVENOUS | Status: AC
  Administered 2015-10-24: 1000 mL via INTRAVENOUS

## 2015-10-24 MED ORDER — METOCLOPRAMIDE HCL 5 MG/ML IJ SOLN
10.0000 mg | Freq: Once | INTRAMUSCULAR | Status: AC
  Administered 2015-10-24: 10 mg via INTRAVENOUS
  Filled 2015-10-24: qty 2

## 2015-10-24 MED ORDER — KETOROLAC TROMETHAMINE 30 MG/ML IJ SOLN
30.0000 mg | Freq: Once | INTRAMUSCULAR | Status: AC
Start: 2015-10-24 — End: 2015-10-24
  Administered 2015-10-24: 30 mg via INTRAVENOUS
  Filled 2015-10-24: qty 1

## 2015-10-24 MED ORDER — MELOXICAM 15 MG PO TABS: 15.0000 mg | ORAL_TABLET | Freq: Every day | ORAL | Status: DC

## 2015-10-24 MED ORDER — ONDANSETRON 4 MG PO TBDP: 4.0000 mg | ORAL_TABLET | Freq: Three times a day (TID) | ORAL | Status: DC | PRN

## 2015-10-24 NOTE — ED Provider Notes (Signed)
CSN: [id number]     Arrival date & time 10/23/15  2129 History   First MD Initiated Contact with Patient 10/24/15 0030     Chief Complaint  Patient presents with  . Headache  . Dizziness  . Nausea     (Consider location/radiation/quality/duration/timing/severity/associated sxs/prior Treatment) HPI Comments: Patient is a 34 year old male with a past medical history of hypertension who presents with a headache that started this morning. Patient reports a gradual onset and progressive worsening of the headache. The pain is described as pressure, constant and is located in generalized head without radiation. Patient has tried tylenol for symptoms without relief. No alleviating/aggravating factors. Patient reports associated nausea and photophobia. Patient denies fever, vomiting, diarrhea, numbness/tingling, weakness, visual changes, congestion, chest pain, SOB, abdominal pain. Patient reports having headaches previously but not as severe as this one.     Patient is a 34 y.o. male presenting with headaches and dizziness.  Headache Associated symptoms: dizziness and nausea   Dizziness Associated symptoms: headaches and nausea     Past Medical History  Diagnosis Date  . Hypertension    History reviewed. No pertinent past surgical history. Family History  Problem Relation Age of Onset  . Hypertension Mother   . Cancer Mother     pancreas  . Hyperlipidemia Mother   . Hypertension Father   . Hyperlipidemia Father   . Heart disease Sister   . Diabetes Neg Hx   . Stroke Neg Hx    Social History  Substance Use Topics  . Smoking status: Former Smoker -- 0.25 packs/day for 10 years  . Smokeless tobacco: Never Used  . Alcohol Use: No    Review of Systems  Gastrointestinal: Positive for nausea.  Neurological: Positive for dizziness and headaches.  All other systems reviewed and are negative.     Allergies  Review of patient's allergies indicates no known allergies.  Home  Medications   Prior to Admission medications   Medication Sig Start Date End Date Taking? Authorizing Provider  lisinopril (PRINIVIL,ZESTRIL) 20 MG tablet One tablet by mouth daily for blood pressure control. 06/12/15  Yes Sean Hommel, DO   BP 156/97 mmHg  Pulse 70  Temp(Src) 97.6 F (36.4 C) (Oral)  Resp 18  SpO2 100% Physical Exam  Constitutional: He is oriented to person, place, and time. He appears well-developed and well-nourished. No distress.  HENT:  Head: Normocephalic and atraumatic.  Eyes: Conjunctivae and EOM are normal. Pupils are equal, round, and reactive to light.  Neck: Normal range of motion.  Cardiovascular: Normal rate and regular rhythm.  Exam reveals no gallop and no friction rub.   No murmur heard. Pulmonary/Chest: Effort normal and breath sounds normal. He has no wheezes. He has no rales. He exhibits no tenderness.  Abdominal: Soft. He exhibits no distension. There is no tenderness. There is no rebound.  Musculoskeletal: Normal range of motion.  Neurological: He is alert and oriented to person, place, and time. No cranial nerve deficit. Coordination normal.  Speech is goal-oriented. Moves limbs without ataxia.   Skin: Skin is warm and dry.  Psychiatric: He has a normal mood and affect. His behavior is normal.  Nursing note and vitals reviewed.   ED Course  Procedures (including critical care time) Labs Review Labs Reviewed - No data to display  Imaging Review Ct Head Wo Contrast  10/24/2015  CLINICAL DATA:  Nonspecific headache beginning at 2200 hours yesterday. Hypertensive. EXAM: CT HEAD WITHOUT CONTRAST TECHNIQUE: Contiguous axial images were obtained  from the base of the skull through the vertex without intravenous contrast. COMPARISON:  None. FINDINGS: The ventricles and sulci are normal. No intraparenchymal hemorrhage, mass effect nor midline shift. No acute large vascular territory infarcts. No abnormal extra-axial fluid collections. Basal cisterns  are patent. No skull fracture. The included ocular globes and orbital contents are non-suspicious. The mastoid aircells and included paranasal sinuses are well-aerated. IMPRESSION: Normal noncontrast CT head. Electronically Signed   By: Awilda Metro M.D.   On: 10/24/2015 01:22   I have personally reviewed and evaluated these images and lab results as part of my medical decision-making.   EKG Interpretation None      MDM   Final diagnoses:  Acute nonintractable headache, unspecified headache type    12:50 AM CT head pending. No neuro deficits at this time. Vitals stable and patient afebrile. No meningeal signs. If head CT is unremarkable, patient will have migraine cocktail. Vitals stable and patient afebrile.   3:01 AM CT head unremarkable for acute changes. Patient reports improvement of symptoms after migraine cocktail. Patient will be discharged with symptomatic treatment.    Emilia Beck, PA-C 10/24/15 0302  Gilda Crease, MD 10/24/15 [phone]

## 2015-10-24 NOTE — ED Notes (Signed)
PA at bedside.

## 2015-10-24 NOTE — ED Notes (Signed)
Patient states he woke up around 10 am with headache and dizziness. Patient states headache is 8/10, and reports an improvement in his dizziness. Patient denies injuries.

## 2015-10-24 NOTE — Discharge Instructions (Signed)
Take mobic as needed for pain. Take zofran as needed for nausea. Refer to attached documents for more information.

## 2015-11-28 ENCOUNTER — Encounter (HOSPITAL_COMMUNITY): Payer: Self-pay | Admitting: Emergency Medicine

## 2015-11-28 ENCOUNTER — Emergency Department (HOSPITAL_COMMUNITY)
Admission: EM | Admit: 2015-11-28 | Discharge: 2015-11-28 | Disposition: A | Discharge status: Home / Self Care | Payer: PRIVATE HEALTH INSURANCE | Attending: Emergency Medicine | Admitting: Emergency Medicine

## 2015-11-28 DIAGNOSIS — Z87891 Personal history of nicotine dependence: Secondary | ICD-10-CM | POA: Insufficient documentation

## 2015-11-28 DIAGNOSIS — Z791 Long term (current) use of non-steroidal anti-inflammatories (NSAID): Secondary | ICD-10-CM | POA: Insufficient documentation

## 2015-11-28 DIAGNOSIS — M62830 Muscle spasm of back: Secondary | ICD-10-CM | POA: Insufficient documentation

## 2015-11-28 DIAGNOSIS — Z79899 Other long term (current) drug therapy: Secondary | ICD-10-CM | POA: Diagnosis not present

## 2015-11-28 DIAGNOSIS — M546 Pain in thoracic spine: Secondary | ICD-10-CM | POA: Diagnosis present

## 2015-11-28 DIAGNOSIS — I10 Essential (primary) hypertension: Secondary | ICD-10-CM | POA: Insufficient documentation

## 2015-11-28 MED ORDER — DIAZEPAM 5 MG PO TABS
5.0000 mg | ORAL_TABLET | Freq: Once | ORAL | Status: DC
  Filled 2015-11-28: qty 1

## 2015-11-28 MED ORDER — IBUPROFEN 600 MG PO TABS: 600.0000 mg | ORAL_TABLET | Freq: Three times a day (TID) | ORAL | Status: DC | PRN

## 2015-11-28 MED ORDER — METHOCARBAMOL 500 MG PO TABS: 500.0000 mg | ORAL_TABLET | Freq: Three times a day (TID) | ORAL | Status: DC | PRN

## 2015-11-28 MED ORDER — IBUPROFEN 800 MG PO TABS
800.0000 mg | ORAL_TABLET | Freq: Once | ORAL | Status: AC
  Administered 2015-11-28: 800 mg via ORAL
  Filled 2015-11-28: qty 1

## 2015-11-28 NOTE — ED Notes (Addendum)
Pt reports mid and lower back pain starting Saturday. Denies precipitating injury/trauma and has never had back surgery. No urinary symptoms related to pain. Took Hydrocodone last night without alleviation of pain. Says, "it just made me sleep." Hx HTN, takes medication but has not had morning dose. Able to walk with steady gait. No other c/c.

## 2015-11-28 NOTE — Discharge Instructions (Signed)

## 2015-11-28 NOTE — ED Provider Notes (Signed)
CSN: [id number]     Arrival date & time 11/28/15  0901 History   First MD Initiated Contact with Patient 11/28/15 [phone]     Chief Complaint  Patient presents with  . Back Pain      HPI Patient presents to the emergency department complaining of mid back pain on the right over the past several days.  He has had this recurrently before.  He states this began several weeks ago with heavy lifting.  He denies weakness or numbness of his legs.  He denies abdominal pain.  No fevers or chills.  Last night he tried someone else's hydrocodone without improvement in his symptoms.  He's had muscle relaxants before in the past but it did not seem to help this morning.  He has not tried heat.  His pain is moderate in severity  Past Medical History  Diagnosis Date  . Hypertension    History reviewed. No pertinent past surgical history. Family History  Problem Relation Age of Onset  . Hypertension Mother   . Cancer Mother     pancreas  . Hyperlipidemia Mother   . Hypertension Father   . Hyperlipidemia Father   . Heart disease Sister   . Diabetes Neg Hx   . Stroke Neg Hx    Social History  Substance Use Topics  . Smoking status: Former Smoker -- 0.25 packs/day for 10 years  . Smokeless tobacco: Never Used  . Alcohol Use: No    Review of Systems  All other systems reviewed and are negative.     Allergies  Review of patient's allergies indicates no known allergies.  Home Medications   Prior to Admission medications   Medication Sig Start Date End Date Taking? Authorizing Provider  lisinopril (PRINIVIL,ZESTRIL) 20 MG tablet One tablet by mouth daily for blood pressure control. 06/12/15   Laren Boom, DO  meloxicam (MOBIC) 15 MG tablet Take 1 tablet (15 mg total) by mouth daily. 10/24/15   Kaitlyn Szekalski, PA-C  ondansetron (ZOFRAN ODT) 4 MG disintegrating tablet Take 1 tablet (4 mg total) by mouth every 8 (eight) hours as needed for nausea or vomiting. 10/24/15   Kaitlyn Szekalski, PA-C    BP 145/112 mmHg  Pulse 75  Temp(Src) 98.1 F (36.7 C) (Oral)  Resp 16  SpO2 98% Physical Exam  Constitutional: He is oriented to person, place, and time. He appears well-developed and well-nourished.  HENT:  Head: Normocephalic.  Eyes: EOM are normal.  Neck: Normal range of motion.  Pulmonary/Chest: Effort normal.  Abdominal: He exhibits no distension.  Musculoskeletal: Normal range of motion.  No thoracic or lumbar tenderness.  Mild parathoracic tenderness with spasm on the right side.  No rash  Neurological: He is alert and oriented to person, place, and time.  Psychiatric: He has a normal mood and affect.  Nursing note and vitals reviewed.   ED Course  Procedures (including critical care time) Labs Review Labs Reviewed - No data to display  Imaging Review No results found. I have personally reviewed and evaluated these images and lab results as part of my medical decision-making.   EKG Interpretation None      MDM   Final diagnoses:  None    Patient with thoracic spasm.  Home with anti-inflammatories, heat, muscle relaxants.  No indication for imaging.  Discharge home in good condition.    Azalia Bilis, MD 11/28/15 [phone]

## 2016-01-11 ENCOUNTER — Encounter: Payer: Self-pay | Admitting: Family Medicine

## 2016-01-11 ENCOUNTER — Ambulatory Visit (INDEPENDENT_AMBULATORY_CARE_PROVIDER_SITE_OTHER): Payer: PRIVATE HEALTH INSURANCE | Admitting: Family Medicine

## 2016-01-11 VITALS — BP 160/105 | HR 78 | Wt 323.0 lb

## 2016-01-11 DIAGNOSIS — M898X1 Other specified disorders of bone, shoulder: Secondary | ICD-10-CM

## 2016-01-11 DIAGNOSIS — I10 Essential (primary) hypertension: Secondary | ICD-10-CM | POA: Diagnosis not present

## 2016-01-11 MED ORDER — METHOCARBAMOL 500 MG PO TABS: 500.0000 mg | ORAL_TABLET | Freq: Three times a day (TID) | ORAL | Status: DC | PRN

## 2016-01-11 MED ORDER — IBUPROFEN 600 MG PO TABS: 600.0000 mg | ORAL_TABLET | Freq: Three times a day (TID) | ORAL | Status: DC | PRN

## 2016-01-11 NOTE — Assessment & Plan Note (Signed)
Plan to treat with physical therapy, heating pad, TENS unit, ibuprofen, and methocarbamol. Recheck in one month. Discussed home exercise program as well.

## 2016-01-11 NOTE — Progress Notes (Signed)
   Subjective:    I'm seeing this patient as a consultation for:  Dr. Ivan Anchors  CC: Back pain  HPI: Patient has an around 2 month history of left-sided thoracic periscapular back pain. Pain started in late November early December. He was evaluated at the emergency room in mid December where he was thought to have a thoracic back spasm. He was treated with ibuprofen and methocarbamol. This worked reasonably well. His pain partially resolved and then recurred about a week ago. He denies any injury. He denies any radiating pain weakness or numbness fevers or chills. Pain is worse with activity and better with rest. He works as a Museum/gallery exhibitions officer. He has not missed work because of the pain yet aside from today.   Past medical history, Surgical history, Family history not pertinant except as noted below, Social history, Allergies, and medications have been entered into the medical record, reviewed, and no changes needed.   Review of Systems: No headache, visual changes, nausea, vomiting, diarrhea, constipation, dizziness, abdominal pain, skin rash, fevers, chills, night sweats, weight loss, swollen lymph nodes, body aches, joint swelling, muscle aches, chest pain, shortness of breath, mood changes, visual or auditory hallucinations.   Objective:    Filed Vitals:   01/11/16 0909  BP: 160/105  Pulse: 78   General: Well Developed, well nourished, and in no acute distress.  Neuro/Psych: Alert and oriented x3, extra-ocular muscles intact, able to move all 4 extremities, sensation grossly intact. Skin: Warm and dry, no rashes noted.  Respiratory: Not using accessory muscles, speaking in full sentences, trachea midline.  Cardiovascular: Pulses palpable, no extremity edema. Abdomen: Does not appear distended. MSK: Normal-appearing scapula and thoracic back. Tender palpation inferior left-sided periscapular muscles. Pain with abduction of shoulders.  No scapular winging visible Pain is reduced with  abduction of the shoulders with pressure of the scapula. Shoulder motion is normal bilaterally. Intact external and internal and abduction shoulder strength. Negative shoulder impingement.  Upper extremity strength is intact and equal throughout.   No results found for this or any previous visit (from the past 24 hour(s)). No results found.  Impression and Recommendations:   This case required medical decision making of moderate complexity.

## 2016-01-11 NOTE — Patient Instructions (Signed)
Thank you for coming in today. Come back or go to the emergency room if you notice new weakness new numbness problems walking or bowel or bladder problems. Attend PT.  Return in 1 month.   TENS UNIT: This is helpful for muscle pain and spasm.   Search and Purchase a TENS 7000 2nd edition at www.tenspros.com. It should be less than $30.     TENS unit instructions: Do not shower or bathe with the unit on Turn the unit off before removing electrodes or batteries If the electrodes lose stickiness add a drop of water to the electrodes after they are disconnected from the unit and place on plastic sheet. If you continued to have difficulty, call the TENS unit company to purchase more electrodes. Do not apply lotion on the skin area prior to use. Make sure the skin is clean and dry as this will help prolong the life of the electrodes. After use, always check skin for unusual red areas, rash or other skin difficulties. If there are any skin problems, does not apply electrodes to the same area. Never remove the electrodes from the unit by pulling the wires. Do not use the TENS unit or electrodes other than as directed. Do not change electrode placement without consultating your therapist or physician. Keep 2 fingers with between each electrode. Wear time ratio is 2:1, on to off times.    For example on for 30 minutes off for 15 minutes and then on for 30 minutes off for 15 minutes

## 2016-01-11 NOTE — Assessment & Plan Note (Signed)
Blood pressure not controlled. Follow-up with PCP.

## 2016-02-08 ENCOUNTER — Ambulatory Visit (INDEPENDENT_AMBULATORY_CARE_PROVIDER_SITE_OTHER): Payer: PRIVATE HEALTH INSURANCE | Admitting: Family Medicine

## 2016-02-08 DIAGNOSIS — M898X1 Other specified disorders of bone, shoulder: Secondary | ICD-10-CM

## 2016-02-08 NOTE — Progress Notes (Signed)
No show

## 2016-05-11 ENCOUNTER — Encounter (HOSPITAL_COMMUNITY): Payer: Self-pay | Admitting: *Deleted

## 2016-05-11 ENCOUNTER — Emergency Department (HOSPITAL_COMMUNITY): Payer: PRIVATE HEALTH INSURANCE

## 2016-05-11 ENCOUNTER — Emergency Department (HOSPITAL_COMMUNITY)
Admission: EM | Admit: 2016-05-11 | Discharge: 2016-05-11 | Disposition: A | Discharge status: Home / Self Care | Payer: PRIVATE HEALTH INSURANCE | Attending: Emergency Medicine | Admitting: Emergency Medicine

## 2016-05-11 DIAGNOSIS — J069 Acute upper respiratory infection, unspecified: Secondary | ICD-10-CM | POA: Diagnosis not present

## 2016-05-11 DIAGNOSIS — I10 Essential (primary) hypertension: Secondary | ICD-10-CM | POA: Diagnosis not present

## 2016-05-11 DIAGNOSIS — Z87891 Personal history of nicotine dependence: Secondary | ICD-10-CM | POA: Diagnosis not present

## 2016-05-11 DIAGNOSIS — J029 Acute pharyngitis, unspecified: Secondary | ICD-10-CM | POA: Diagnosis present

## 2016-05-11 DIAGNOSIS — Z79899 Other long term (current) drug therapy: Secondary | ICD-10-CM | POA: Diagnosis not present

## 2016-05-11 DIAGNOSIS — J988 Other specified respiratory disorders: Secondary | ICD-10-CM

## 2016-05-11 DIAGNOSIS — B9789 Other viral agents as the cause of diseases classified elsewhere: Secondary | ICD-10-CM | POA: Diagnosis not present

## 2016-05-11 LAB — RAPID STREP SCREEN (MED CTR MEBANE ONLY): Streptococcus, Group A Screen (Direct): NEGATIVE

## 2016-05-11 MED ORDER — CHLORHEXIDINE GLUCONATE 0.12 % MT SOLN: 15.0000 mL | Freq: Two times a day (BID) | OROMUCOSAL | Status: DC

## 2016-05-11 MED ORDER — PROMETHAZINE-DM 6.25-15 MG/5ML PO SYRP: 5.0000 mL | ORAL_SOLUTION | Freq: Four times a day (QID) | ORAL | Status: DC | PRN

## 2016-05-11 MED ORDER — IBUPROFEN 800 MG PO TABS
800.0000 mg | ORAL_TABLET | Freq: Once | ORAL | Status: AC
  Administered 2016-05-11: 800 mg via ORAL
  Filled 2016-05-11: qty 1

## 2016-05-11 MED ORDER — ACETAMINOPHEN 325 MG PO TABS
650.0000 mg | ORAL_TABLET | Freq: Once | ORAL | Status: AC | PRN
  Administered 2016-05-11: 650 mg via ORAL
  Filled 2016-05-11: qty 2

## 2016-05-11 MED ORDER — ACETAMINOPHEN 325 MG PO TABS
650.0000 mg | ORAL_TABLET | Freq: Once | ORAL | Status: AC
  Administered 2016-05-11: 650 mg via ORAL
  Filled 2016-05-11: qty 2

## 2016-05-11 NOTE — ED Provider Notes (Signed)
CSN: [id number]     Arrival date & time 05/11/16  2029 History  By signing my name below, I, Soijett Blue, attest that this documentation has been prepared under the direction and in the presence of Fayrene Helper, PA-C Electronically Signed: Soijett Blue, ED Scribe. 05/11/2016. 8:59 PM.   Chief Complaint  Patient presents with  . Sore Throat      The history is provided by the patient. No language interpreter was used.     HPI Comments: Henry Chandler is a 35 y.o. male with a PMHx of HTN, who presents to the Emergency Department complaining of sore throat onset yesterday. Pt reports that he had similar symptoms several weeks ago that resolved on its own. He states that he is having associated symptoms of subjective fever, chills x today, body aches x today, cough, and mild ear pain. He states that he has tried alka-selzter flu with his last dose at 2 PM with mild relief for his symptoms. He denies congestion, rhinorrhea, v/d, and any other symptoms. Denies sick contacts. Denies allergies to medications.  Past Medical History  Diagnosis Date  . Hypertension    History reviewed. No pertinent past surgical history. Family History  Problem Relation Age of Onset  . Hypertension Mother   . Cancer Mother     pancreas  . Hyperlipidemia Mother   . Hypertension Father   . Hyperlipidemia Father   . Heart disease Sister   . Diabetes Neg Hx   . Stroke Neg Hx    Social History  Substance Use Topics  . Smoking status: Former Smoker -- 0.25 packs/day for 10 years  . Smokeless tobacco: Never Used  . Alcohol Use: No    Review of Systems  Constitutional: Positive for fever and chills.  HENT: Positive for sore throat.   Musculoskeletal: Positive for myalgias.  All other systems reviewed and are negative.     Allergies  Review of patient's allergies indicates no known allergies.  Home Medications   Prior to Admission medications   Medication Sig Start Date End Date Taking? Authorizing  Provider  ibuprofen (ADVIL,MOTRIN) 600 MG tablet Take 1 tablet (600 mg total) by mouth every 8 (eight) hours as needed. 01/11/16   Rodolph Bong, MD  lisinopril (PRINIVIL,ZESTRIL) 20 MG tablet One tablet by mouth daily for blood pressure control. 06/12/15   Laren Boom, DO  methocarbamol (ROBAXIN) 500 MG tablet Take 1 tablet (500 mg total) by mouth every 8 (eight) hours as needed for muscle spasms. 01/11/16   Rodolph Bong, MD  ondansetron (ZOFRAN ODT) 4 MG disintegrating tablet Take 1 tablet (4 mg total) by mouth every 8 (eight) hours as needed for nausea or vomiting. 10/24/15   Kaitlyn Szekalski, PA-C   BP 185/102 mmHg  Pulse 118  Temp(Src) 103.2 F (39.6 C) (Oral)  Resp 18  Ht  (1.854 m)  Wt 320 lb (145.151 kg)  BMI 42.23 kg/m2  SpO2 98% Physical Exam  Constitutional: He is oriented to person, place, and time. He appears well-developed and well-nourished. No distress.  AAM non toxic in appearance.  HENT:  Head: Normocephalic and atraumatic.  Right Ear: Tympanic membrane and ear canal normal.  Left Ear: Ear canal normal. Tympanic membrane is erythematous. Tympanic membrane is not bulging.  Nose: Nose normal.  Mouth/Throat: Uvula is midline. No trismus in the jaw. Posterior oropharyngeal edema present. No oropharyngeal exudate.  Mild tonsillary enlargement without exudate.   Eyes: EOM are normal.  Neck: Neck supple.  No  nuchal rigidity  Cardiovascular: Regular rhythm and normal heart sounds.  Tachycardia present.  Exam reveals no gallop and no friction rub.   No murmur heard. Pulmonary/Chest: Effort normal and breath sounds normal. No respiratory distress. He has no wheezes. He has no rales.  Abdominal: He exhibits no distension.  Musculoskeletal: Normal range of motion.  Lymphadenopathy:    He has no cervical adenopathy.  Neurological: He is alert and oriented to person, place, and time.  Skin: Skin is warm and dry.  Psychiatric: He has a normal mood and affect. His behavior is  normal.  Nursing note and vitals reviewed.   ED Course  Procedures (including critical care time) DIAGNOSTIC STUDIES: Oxygen Saturation is 98% on RA, nl by my interpretation.    COORDINATION OF CARE: 8:57 PM Discussed treatment plan with pt at bedside which includes rapid strep screen and culture and CXR and pt agreed to plan.  Pt has a temp of 103.2.  Tylenol given.      Labs Review Labs Reviewed  RAPID STREP SCREEN (NOT AT [facility])  CULTURE, GROUP A STREP [facility])    Imaging Review Dg Chest 2 View  05/11/2016  CLINICAL DATA:  Cough and fever. Sore throat and body aches. Symptoms for 2 days. EXAM: CHEST  2 VIEW COMPARISON:  03/07/2015 FINDINGS: The cardiomediastinal contours are normal. The lungs are clear. Pulmonary vasculature is normal. No consolidation, pleural effusion, or pneumothorax. No acute osseous abnormalities are seen. IMPRESSION: No acute pulmonary process. Electronically Signed   By: Rubye Oaks M.D.   On: 05/11/2016 21:44   I have personally reviewed and evaluated these images and lab results as part of my medical decision-making.   EKG Interpretation None      MDM   Final diagnoses:  Viral respiratory infection    Pt with negative strep. Diagnosis of viral respiratory infection. No abx indicated at this time. Discussed that results of strep culture are pending and patient will be informed if positive result and abx will be called in at that time. CXR negative. Discharge with symptomatic tx. No evidence of dehydration. Pt is tolerating secretions. Presentation not concerning for peritonsillar abscess or spread of infection to deep spaces of the throat; patent airway. Specific return precautions discussed. Recommended PCP follow up. Pt appears safe for discharge.  BP 153/91 mmHg  Pulse 104  Temp(Src) 102.4 F (39.1 C) (Oral)  Resp 18  Ht 6\' 1"  (1.854 m)  Wt 145.151 kg  BMI 42.23 kg/m2  SpO2 97%  The patient was noted to be hypertensive today in the  emergency department. I have spoken with the patient regarding hypertension and the need for improved management. I instructed the patient to followup with the Primary care doctor within 4 days to improve the management of the patient's hypertension. I also counseled the patient regarding the signs and symptoms which would require an emergent visit to an emergency department for hypertensive urgency and/or hypertensive emergency. The patient understood the need for improved hypertensive management.   I personally performed the services described in this documentation, which was scribed in my presence. The recorded information has been reviewed and is accurate.      Fayrene Helper, PA-C 05/11/16 2152  Linwood Dibbles, MD 05/12/16 [phone]

## 2016-05-11 NOTE — ED Notes (Signed)
Pt states that he began having a sore throat yesterday; pt states that he woke up with a fever, chills and body aches today; pt states that he took Alka-Seltzer flu around 2pm but nothing since

## 2016-05-11 NOTE — ED Notes (Signed)
Pt states that he has not taken his BP med today

## 2016-05-11 NOTE — Discharge Instructions (Signed)
Viral Infections °A viral infection can be caused by different types of viruses. Most viral infections are not serious and resolve on their own. However, some infections may cause severe symptoms and may lead to further complications. °SYMPTOMS °Viruses can frequently cause: °· Minor sore throat. °· Aches and pains. °· Headaches. °· Runny nose. °· Different types of rashes. °· Watery eyes. °· Tiredness. °· Cough. °· Loss of appetite. °· Gastrointestinal infections, resulting in nausea, vomiting, and diarrhea. °These symptoms do not respond to antibiotics because the infection is not caused by bacteria. However, you might catch a bacterial infection following the viral infection. This is sometimes called a "superinfection." Symptoms of such a bacterial infection may include: °· Worsening sore throat with pus and difficulty swallowing. °· Swollen neck glands. °· Chills and a high or persistent fever. °· Severe headache. °· Tenderness over the sinuses. °· Persistent overall ill feeling (malaise), muscle aches, and tiredness (fatigue). °· Persistent cough. °· Yellow, green, or brown mucus production with coughing. °HOME CARE INSTRUCTIONS  °· Only take over-the-counter or prescription medicines for pain, discomfort, diarrhea, or fever as directed by your caregiver. °· Drink enough water and fluids to keep your urine clear or pale yellow. Sports drinks can provide valuable electrolytes, sugars, and hydration. °· Get plenty of rest and maintain proper nutrition. Soups and broths with crackers or rice are fine. °SEEK IMMEDIATE MEDICAL CARE IF:  °· You have severe headaches, shortness of breath, chest pain, neck pain, or an unusual rash. °· You have uncontrolled vomiting, diarrhea, or you are unable to keep down fluids. °· You or your child has an oral temperature above 102° F (38.9° C), not controlled by medicine. °· Your baby is older than 3 months with a rectal temperature of 102° F (38.9° C) or higher. °· Your baby is 3  months old or younger with a rectal temperature of 100.4° F (38° C) or higher. °MAKE SURE YOU:  °· Understand these instructions. °· Will watch your condition. °· Will get help right away if you are not doing well or get worse. °  °This information is not intended to replace advice given to you by your health care provider. Make sure you discuss any questions you have with your health care provider. °  °Document Released: 09/11/2005 Document Revised: 02/24/2012 Document Reviewed: 05/10/2015 °Elsevier Interactive Patient Education ©2016 [employer] ° °

## 2016-05-12 ENCOUNTER — Emergency Department (HOSPITAL_COMMUNITY)
Admission: EM | Admit: 2016-05-12 | Discharge: 2016-05-12 | Disposition: A | Discharge status: Home / Self Care | Payer: PRIVATE HEALTH INSURANCE | Attending: Emergency Medicine | Admitting: Emergency Medicine

## 2016-05-12 ENCOUNTER — Encounter (HOSPITAL_COMMUNITY): Payer: Self-pay | Admitting: Emergency Medicine

## 2016-05-12 DIAGNOSIS — R1310 Dysphagia, unspecified: Secondary | ICD-10-CM | POA: Insufficient documentation

## 2016-05-12 DIAGNOSIS — R590 Localized enlarged lymph nodes: Secondary | ICD-10-CM | POA: Insufficient documentation

## 2016-05-12 DIAGNOSIS — J020 Streptococcal pharyngitis: Secondary | ICD-10-CM

## 2016-05-12 DIAGNOSIS — I10 Essential (primary) hypertension: Secondary | ICD-10-CM | POA: Insufficient documentation

## 2016-05-12 DIAGNOSIS — Z87891 Personal history of nicotine dependence: Secondary | ICD-10-CM | POA: Insufficient documentation

## 2016-05-12 DIAGNOSIS — Z791 Long term (current) use of non-steroidal anti-inflammatories (NSAID): Secondary | ICD-10-CM | POA: Insufficient documentation

## 2016-05-12 MED ORDER — IBUPROFEN 800 MG PO TABS
800.0000 mg | ORAL_TABLET | Freq: Once | ORAL | Status: AC
  Administered 2016-05-12: 800 mg via ORAL
  Filled 2016-05-12: qty 1

## 2016-05-12 MED ORDER — DEXAMETHASONE SODIUM PHOSPHATE 10 MG/ML IJ SOLN
10.0000 mg | Freq: Once | INTRAMUSCULAR | Status: AC
  Administered 2016-05-12: 10 mg via INTRAMUSCULAR
  Filled 2016-05-12: qty 1

## 2016-05-12 MED ORDER — PENICILLIN G BENZATHINE 1200000 UNIT/2ML IM SUSP
1.2000 10*6.[IU] | Freq: Once | INTRAMUSCULAR | Status: AC
  Administered 2016-05-12: 1.2 10*6.[IU] via INTRAMUSCULAR
  Filled 2016-05-12: qty 2

## 2016-05-12 NOTE — ED Notes (Signed)
Per pt, states he was here last night for same-states not better

## 2016-05-12 NOTE — Discharge Instructions (Signed)
Strep Throat °Strep throat is a bacterial infection of the throat. Your health care provider may call the infection tonsillitis or pharyngitis, depending on whether there is swelling in the tonsils or at the back of the throat. Strep throat is most common during the cold months of the year in children who are 5-34 years of age, but it can happen during any season in people of any age. This infection is spread from person to person (contagious) through coughing, sneezing, or close contact. °CAUSES °Strep throat is caused by the bacteria called Streptococcus pyogenes. °RISK FACTORS °This condition is more likely to develop in: °· People who spend time in crowded places where the infection can spread easily. °· People who have close contact with someone who has strep throat. °SYMPTOMS °Symptoms of this condition include: °· Fever or chills.   °· Redness, swelling, or pain in the tonsils or throat. °· Pain or difficulty when swallowing. °· White or yellow spots on the tonsils or throat. °· Swollen, tender glands in the neck or under the jaw. °· Red rash all over the body (rare). °DIAGNOSIS °This condition is diagnosed by performing a rapid strep test or by taking a swab of your throat (throat culture test). Results from a rapid strep test are usually ready in a few minutes, but throat culture test results are available after one or two days. °TREATMENT °This condition is treated with antibiotic medicine. °HOME CARE INSTRUCTIONS °Medicines °· Take over-the-counter and prescription medicines only as told by your health care provider. °· Take your antibiotic as told by your health care provider. Do not stop taking the antibiotic even if you start to feel better. °· Have family members who also have a sore throat or fever tested for strep throat. They may need antibiotics if they have the strep infection. °Eating and Drinking °· Do not share food, drinking cups, or personal items that could cause the infection to spread to  other people. °· If swallowing is difficult, try eating soft foods until your sore throat feels better. °· Drink enough fluid to keep your urine clear or pale yellow. °General Instructions °· Gargle with a salt-water mixture 3-4 times per day or as needed. To make a salt-water mixture, completely dissolve ½-1 tsp of salt in 1 cup of warm water. °· Make sure that all household members wash their hands well. °· Get plenty of rest. °· Stay home from school or work until you have been taking antibiotics for 24 hours. °· Keep all follow-up visits as told by your health care provider. This is important. °SEEK MEDICAL CARE IF: °· The glands in your neck continue to get bigger. °· You develop a rash, cough, or earache. °· You cough up a thick liquid that is green, yellow-brown, or bloody. °· You have pain or discomfort that does not get better with medicine. °· Your problems seem to be getting worse rather than better. °· You have a fever. °SEEK IMMEDIATE MEDICAL CARE IF: °· You have new symptoms, such as vomiting, severe headache, stiff or painful neck, chest pain, or shortness of breath. °· You have severe throat pain, drooling, or changes in your voice. °· You have swelling of the neck, or the skin on the neck becomes red and tender. °· You have signs of dehydration, such as fatigue, dry mouth, and decreased urination. °· You become increasingly sleepy, or you cannot wake up completely. °· Your joints become red or painful. °  °This information is not intended to replace   advice given to you by your health care provider. Make sure you discuss any questions you have with your health care provider.   Continue taking ibuprofen at home for fever and body aches. You may alternate taking this with tylenol every 3-4 hours. Encourage adequate hydration, drink plenty of fluids. You may use home oral rinse to help with throat analgesia. Follow up with your PCP if your symptoms do not improve. Return to the ED if you experience  severe worsening of your symptoms, difficulty breathing or swallowing, difficulty opening your mouth, rash.

## 2016-05-12 NOTE — ED Provider Notes (Signed)
CSN: [id number]     Arrival date & time 05/12/16  1630 History  By signing my name below, I, Phillis Haggis, attest that this documentation has been prepared under the direction and in the presence of [employer], PA-C. Electronically Signed: Phillis Haggis, ED Scribe. 05/12/2016. 5:29 PM.   Chief Complaint  Patient presents with  . Generalized Body Aches   The history is provided by the patient. No language interpreter was used.  HPI Comments: Henry Chandler is a 35 y.o. Male with a hx of HTN who presents to the Emergency Department complaining of sore throat onset 2 days ago. Pt reports associated fever, pain with swallowing, left ear pain, and generalized myalgias. Pt was seen in the ED yesterday for the same. He was given cough medication for his symptoms to no relief. He has also been taking Tylenol and ibuprofen to no relief. He states that he had similar symptoms over a week ago, but it went away on its own. He denies sick contacts. Pt denies cough, nausea, vomiting, or rhinorrhea.  Past Medical History  Diagnosis Date  . Hypertension    History reviewed. No pertinent past surgical history. Family History  Problem Relation Age of Onset  . Hypertension Mother   . Cancer Mother     pancreas  . Hyperlipidemia Mother   . Hypertension Father   . Hyperlipidemia Father   . Heart disease Sister   . Diabetes Neg Hx   . Stroke Neg Hx    Social History  Substance Use Topics  . Smoking status: Former Smoker -- 0.25 packs/day for 10 years  . Smokeless tobacco: Never Used  . Alcohol Use: No    Review of Systems  Constitutional: Positive for fever.  HENT: Positive for ear pain and sore throat.   Respiratory: Negative for cough.   Gastrointestinal: Negative for nausea and vomiting.  Musculoskeletal: Positive for myalgias.  All other systems reviewed and are negative.  Allergies  Review of patient's allergies indicates no known allergies.  Home Medications   Prior to Admission  medications   Medication Sig Start Date End Date Taking? Authorizing Provider  chlorhexidine (PERIDEX) 0.12 % solution Use as directed 15 mLs in the mouth or throat 2 (two) times daily. 05/11/16   Fayrene Helper, PA-C  ibuprofen (ADVIL,MOTRIN) 600 MG tablet Take 1 tablet (600 mg total) by mouth every 8 (eight) hours as needed. 01/11/16   Rodolph Bong, MD  lisinopril (PRINIVIL,ZESTRIL) 20 MG tablet One tablet by mouth daily for blood pressure control. 06/12/15   Laren Boom, DO  methocarbamol (ROBAXIN) 500 MG tablet Take 1 tablet (500 mg total) by mouth every 8 (eight) hours as needed for muscle spasms. 01/11/16   Rodolph Bong, MD  ondansetron (ZOFRAN ODT) 4 MG disintegrating tablet Take 1 tablet (4 mg total) by mouth every 8 (eight) hours as needed for nausea or vomiting. 10/24/15   Kaitlyn Szekalski, PA-C  promethazine-dextromethorphan (PROMETHAZINE-DM) 6.25-15 MG/5ML syrup Take 5 mLs by mouth 4 (four) times daily as needed for cough. 05/11/16   Fayrene Helper, PA-C   BP 155/108 mmHg  Pulse 106  Temp(Src) 100.5 F (38.1 C)  Resp 16  Ht  (1.854 m)  Wt 320 lb (145.151 kg)  BMI 42.23 kg/m2  SpO2 97% Physical Exam  Constitutional: He is oriented to person, place, and time. He appears well-developed and well-nourished. No distress.  HENT:  Head: Normocephalic and atraumatic.  Left Ear: Tympanic membrane is erythematous.  Mouth/Throat: Uvula is midline  and mucous membranes are normal. Oropharyngeal exudate, posterior oropharyngeal edema and posterior oropharyngeal erythema present. No tonsillar abscesses.  Eyes: Conjunctivae are normal. Right eye exhibits no discharge. Left eye exhibits no discharge. No scleral icterus.  Cardiovascular: Normal rate.   Pulmonary/Chest: Effort normal.  Lymphadenopathy:    He has cervical adenopathy.  Neurological: He is alert and oriented to person, place, and time. Coordination normal.  Skin: Skin is warm and dry. No rash noted. He is not diaphoretic. No erythema. No  pallor.  Psychiatric: He has a normal mood and affect. His behavior is normal.  Nursing note and vitals reviewed.   ED Course  Procedures (including critical care time) DIAGNOSTIC STUDIES: Oxygen Saturation is 97% on RA, normal by my interpretation.    COORDINATION OF CARE: 5:29 PM-Discussed treatment plan which includes antibiotics with pt at bedside and pt agreed to plan.    Labs Review Labs Reviewed - No data to display  Imaging Review Dg Chest 2 View  05/11/2016  CLINICAL DATA:  Cough and fever. Sore throat and body aches. Symptoms for 2 days. EXAM: CHEST  2 VIEW COMPARISON:  03/07/2015 FINDINGS: The cardiomediastinal contours are normal. The lungs are clear. Pulmonary vasculature is normal. No consolidation, pleural effusion, or pneumothorax. No acute osseous abnormalities are seen. IMPRESSION: No acute pulmonary process. Electronically Signed   By: Rubye Oaks M.D.   On: 05/11/2016 21:44   I have personally reviewed and evaluated these images and lab results as part of my medical decision-making.   EKG Interpretation None      MDM   Final diagnoses:  Strep pharyngitis    Pt febrile with tonsillar exudate, cervical lymphadenopathy, & dysphagia; diagnosis of strep. Pt was seen yesterday for same symptoms, he had negative rapid strep. However, clinically on exam pt with strep throat. 4/4 centor criteria present.Treated in the Ed with steroids, NSAIDs, Pain medication and PCN IM.  Pt appears mildly dehydrated, discussed importance of water rehydration. Presentation non concerning for PTA or infxn spread to soft tissue. No trismus or uvula deviation. Specific return precautions discussed. Pt able to drink water in ED without difficulty with intact air way. Recommended PCP follow up.   I personally performed the services described in this documentation, which was scribed in my presence. The recorded information has been reviewed and is accurate.     Lester Kinsman  [place], PA-C 05/12/16 1933  Linwood Dibbles, MD 05/13/16 [phone]

## 2016-05-12 NOTE — ED Notes (Signed)
PT DISCHARGED. INSTRUCTIONS GIVEN. AAOX4. PT IN NO APPARENT DISTRESS. THE OPPORTUNITY TO ASK QUESTIONS WAS PROVIDED. 

## 2016-05-13 LAB — CULTURE, GROUP A STREP (THRC)

## 2016-05-15 ENCOUNTER — Encounter: Payer: Self-pay | Admitting: Family Medicine

## 2016-05-15 ENCOUNTER — Ambulatory Visit (INDEPENDENT_AMBULATORY_CARE_PROVIDER_SITE_OTHER): Payer: PRIVATE HEALTH INSURANCE | Admitting: Family Medicine

## 2016-05-15 VITALS — BP 158/98 | HR 109 | Temp 100.4°F | Wt 319.0 lb

## 2016-05-15 DIAGNOSIS — I10 Essential (primary) hypertension: Secondary | ICD-10-CM | POA: Diagnosis not present

## 2016-05-15 DIAGNOSIS — R509 Fever, unspecified: Secondary | ICD-10-CM

## 2016-05-15 MED ORDER — LISINOPRIL 20 MG PO TABS: ORAL_TABLET | ORAL | Status: DC

## 2016-05-15 MED ORDER — CEFTRIAXONE SODIUM 1 G IJ SOLR
1.0000 g | Freq: Once | INTRAMUSCULAR | Status: AC
  Administered 2016-05-15: 1 g via INTRAMUSCULAR

## 2016-05-15 MED ORDER — AMOXICILLIN-POT CLAVULANATE 500-125 MG PO TABS: ORAL_TABLET | ORAL | Status: AC

## 2016-05-15 MED ORDER — KETOROLAC TROMETHAMINE 60 MG/2ML IM SOLN
60.0000 mg | Freq: Once | INTRAMUSCULAR | Status: AC
  Administered 2016-05-15: 60 mg via INTRAMUSCULAR

## 2016-05-15 NOTE — Addendum Note (Signed)
Addended by: Thom Chimes on: 05/15/2016 11:37 AM   Modules accepted: Orders

## 2016-05-15 NOTE — Progress Notes (Signed)
CC: Henry Chandler is a 35 y.o. male is here for Fever   Subjective: HPI:  Sore throat and fever since Friday. He's already received a injection of penicillin and Decadron but he doesn't believe that this helped much. His highest fever has been 105.0 that occurred on the first day of this illness now it's hovering somewhere around 101.0. Slight benefit from Tylenol and ibuprofen but no permanent fix. He denies cough, wheezing, shortness of breath. He is having fevers and chills. It hurts to swallow both food and liquids. He denies any chest discomfort or rash. No photophobia  Follow hypertension: He ran out of lisinopril many weeks ago. No outside blood pressures report.   Review Of Systems Outlined In HPI  Past Medical History  Diagnosis Date  . Hypertension     No past surgical history on file. Family History  Problem Relation Age of Onset  . Hypertension Mother   . Cancer Mother     pancreas  . Hyperlipidemia Mother   . Hypertension Father   . Hyperlipidemia Father   . Heart disease Sister   . Diabetes Neg Hx   . Stroke Neg Hx     Social History   Social History  . Marital Status: Single    Spouse Name: N/A  . Number of Children: N/A  . Years of Education: GED   Occupational History  . Forklift operator    Social History Main Topics  . Smoking status: Former Smoker -- 0.25 packs/day for 10 years  . Smokeless tobacco: Never Used  . Alcohol Use: No  . Drug Use: No  . Sexual Activity: Yes    Birth Control/ Protection: Pill     Comment: 1 or less daly   Other Topics Concern  . Not on file   Social History Narrative   Regular exercise-yes   Caffeine Use-yes     Objective: BP 158/98 mmHg  Pulse 109  Temp(Src) 100.4 F (38 C) (Oral)  Wt 319 lb (144.697 kg)  SpO2 91%  General: Alert and Oriented, No Acute Distress HEENT: Pupils equal, round, reactive to light. Conjunctivae clear.  External ears unremarkable, canals clear with intact TMs with appropriate  landmarks.  Middle ear appears open without effusion. Pink inferior turbinates.  Moist mucous membranes, Uvula is midline with bilateral tonsillar exudates and erythematous posterior wall of the pharynx..  Neck supple without palpable lymphadenopathy nor abnormal masses. Lungs: Clear and comfortable work of breathing Cardiac: Regular rate and rhythm.  Extremities: No peripheral edema.  Strong peripheral pulses.  Mental Status: No depression, anxiety, nor agitation. Skin: Warm and dry.  Assessment & Plan: Henry Chandler was seen today for fever.  Diagnoses and all orders for this visit:  Essential hypertension, benign -     lisinopril (PRINIVIL,ZESTRIL) 20 MG tablet; One tablet by mouth daily for blood pressure control.  Fever, unspecified -     Monospot -     amoxicillin-clavulanate (AUGMENTIN) 500-125 MG tablet; Take one by mouth every 8 hours for ten total days.   Essential hypertension: Uncontrolled chronic condition without lisinopril, restart formal regimen and follow-up as soon as possible if blood pressure remains above 140/90 after taking medications for 2 consecutive days. Fever: High suspicion for strep pharyngitis, he'll receive Toradol and also ceftriaxone injection today given the lack of response to penicillin IM. Also start Augmentin. I like to make sure this is not monitor given that he didn't respond to penicillin.  Signs and symptoms requring emergent/urgent reevaluation were discussed with the  patient.  Return if symptoms worsen or fail to improve.

## 2016-05-16 LAB — MONONUCLEOSIS SCREEN: HETEROPHILE, MONO SCREEN: NEGATIVE

## 2016-05-19 ENCOUNTER — Emergency Department (HOSPITAL_COMMUNITY)
Admission: EM | Admit: 2016-05-19 | Discharge: 2016-05-19 | Disposition: A | Discharge status: Home / Self Care | Payer: PRIVATE HEALTH INSURANCE | Attending: Emergency Medicine | Admitting: Emergency Medicine

## 2016-05-19 ENCOUNTER — Emergency Department (HOSPITAL_COMMUNITY): Payer: PRIVATE HEALTH INSURANCE

## 2016-05-19 ENCOUNTER — Encounter (HOSPITAL_COMMUNITY): Payer: Self-pay | Admitting: Emergency Medicine

## 2016-05-19 DIAGNOSIS — Z87891 Personal history of nicotine dependence: Secondary | ICD-10-CM | POA: Insufficient documentation

## 2016-05-19 DIAGNOSIS — J029 Acute pharyngitis, unspecified: Secondary | ICD-10-CM | POA: Insufficient documentation

## 2016-05-19 DIAGNOSIS — I10 Essential (primary) hypertension: Secondary | ICD-10-CM | POA: Diagnosis not present

## 2016-05-19 LAB — CBC WITH DIFFERENTIAL/PLATELET
BASOS PCT: 2 %
Basophils Absolute: 0.2 10*3/uL — ABNORMAL HIGH (ref 0.0–0.1)
EOS PCT: 0 %
Eosinophils Absolute: 0 10*3/uL (ref 0.0–0.7)
HEMATOCRIT: 44.3 % (ref 39.0–52.0)
Hemoglobin: 15.4 g/dL (ref 13.0–17.0)
LYMPHS ABS: 2.8 10*3/uL (ref 0.7–4.0)
Lymphocytes Relative: 23 %
MCH: 28.5 pg (ref 26.0–34.0)
MCHC: 34.8 g/dL (ref 30.0–36.0)
MCV: 81.9 fL (ref 78.0–100.0)
MONO ABS: 1.2 10*3/uL — AB (ref 0.1–1.0)
MONOS PCT: 10 %
Neutro Abs: 8 10*3/uL — ABNORMAL HIGH (ref 1.7–7.7)
Neutrophils Relative %: 65 %
PLATELETS: 311 10*3/uL (ref 150–400)
RBC: 5.41 MIL/uL (ref 4.22–5.81)
RDW: 13.7 % (ref 11.5–15.5)
WBC: 12.2 10*3/uL — AB (ref 4.0–10.5)

## 2016-05-19 LAB — BASIC METABOLIC PANEL
Anion gap: 10 (ref 5–15)
BUN: 14 mg/dL (ref 6–20)
CALCIUM: 9.2 mg/dL (ref 8.9–10.3)
CO2: 25 mmol/L (ref 22–32)
CREATININE: 1.09 mg/dL (ref 0.61–1.24)
Chloride: 103 mmol/L (ref 101–111)
Glucose, Bld: 109 mg/dL — ABNORMAL HIGH (ref 65–99)
Potassium: 3.7 mmol/L (ref 3.5–5.1)
SODIUM: 138 mmol/L (ref 135–145)

## 2016-05-19 LAB — RAPID STREP SCREEN (MED CTR MEBANE ONLY): Streptococcus, Group A Screen (Direct): NEGATIVE

## 2016-05-19 MED ORDER — HYDROCODONE-HOMATROPINE 5-1.5 MG/5ML PO SYRP: 5.0000 mL | ORAL_SOLUTION | Freq: Four times a day (QID) | ORAL | Status: DC | PRN

## 2016-05-19 MED ORDER — MAGIC MOUTHWASH W/LIDOCAINE: 5.0000 mL | Freq: Four times a day (QID) | ORAL | Status: DC

## 2016-05-19 MED ORDER — MORPHINE SULFATE (PF) 4 MG/ML IV SOLN
4.0000 mg | Freq: Once | INTRAVENOUS | Status: AC
  Administered 2016-05-19: 4 mg via INTRAVENOUS
  Filled 2016-05-19: qty 1

## 2016-05-19 MED ORDER — ONDANSETRON HCL 4 MG/2ML IJ SOLN
4.0000 mg | Freq: Once | INTRAMUSCULAR | Status: AC
  Administered 2016-05-19: 4 mg via INTRAVENOUS
  Filled 2016-05-19: qty 2

## 2016-05-19 MED ORDER — DEXAMETHASONE SODIUM PHOSPHATE 10 MG/ML IJ SOLN
10.0000 mg | Freq: Once | INTRAMUSCULAR | Status: AC
  Administered 2016-05-19: 10 mg via INTRAVENOUS
  Filled 2016-05-19: qty 1

## 2016-05-19 MED ORDER — IOPAMIDOL (ISOVUE-300) INJECTION 61%
75.0000 mL | Freq: Once | INTRAVENOUS | Status: AC | PRN
  Administered 2016-05-19: 75 mL via INTRAVENOUS

## 2016-05-19 MED ORDER — SODIUM CHLORIDE 0.9 % IV BOLUS (SEPSIS)
1000.0000 mL | Freq: Once | INTRAVENOUS | Status: AC
  Administered 2016-05-19: 1000 mL via INTRAVENOUS

## 2016-05-19 NOTE — ED Provider Notes (Signed)
CSN: [id number]     Arrival date & time 05/19/16  0750 History   First MD Initiated Contact with Patient 05/19/16 0820     Chief Complaint  Patient presents with  . Sore Throat     (Consider location/radiation/quality/duration/timing/severity/associated sxs/prior Treatment) HPI This is a 35 year old male who presents emergency Department with chief complaint of sore throat. This is the patient's third visit for the same problem. He was seen twice at the end of May on the 27th and 28th. He was treated for strep because of his clinical presentation and given IM penicillin as well as amoxicillin. The patient states that he has not improved in his fact worsening. He complains of severe pain on the left side that radiates into his ear. He hears a "whistling" sound in the left ear. He denies trismus, but has extreme pain with swallowing and has had significant difficulty eating or drinking. He has only been able to tolerate hot liquids. He states the pain is on the left. He has pain in the left side of his neck to palpation and movement. He has been taking ibuprofen with minimal relief of his symptoms. His girlfriend states that he has had a change in his voice. He is a current smoker. Past Medical History  Diagnosis Date  . Hypertension    History reviewed. No pertinent past surgical history. Family History  Problem Relation Age of Onset  . Hypertension Mother   . Cancer Mother     pancreas  . Hyperlipidemia Mother   . Hypertension Father   . Hyperlipidemia Father   . Heart disease Sister   . Diabetes Neg Hx   . Stroke Neg Hx    Social History  Substance Use Topics  . Smoking status: Former Smoker -- 0.25 packs/day for 10 years  . Smokeless tobacco: Never Used  . Alcohol Use: No    Review of Systems  Ten systems reviewed and are negative for acute change, except as noted in the HPI.    Allergies  Review of patient's allergies indicates no known allergies.  Home Medications    Prior to Admission medications   Medication Sig Start Date End Date Taking? Authorizing Provider  amoxicillin-clavulanate (AUGMENTIN) 500-125 MG tablet Take one by mouth every 8 hours for ten total days. 05/15/16 05/25/16 Yes Sean Hommel, DO  chlorhexidine (PERIDEX) 0.12 % solution Use as directed 15 mLs in the mouth or throat 2 (two) times daily. 05/11/16  Yes Fayrene Helper, PA-C  ibuprofen (ADVIL,MOTRIN) 800 MG tablet Take 800 mg by mouth every 8 (eight) hours as needed for moderate pain.   Yes Historical Provider, MD  lisinopril (PRINIVIL,ZESTRIL) 20 MG tablet One tablet by mouth daily for blood pressure control. 05/15/16  Yes Sean Hommel, DO  methocarbamol (ROBAXIN) 500 MG tablet Take 1 tablet (500 mg total) by mouth every 8 (eight) hours as needed for muscle spasms. 01/11/16  Yes Rodolph Bong, MD  promethazine-dextromethorphan (PROMETHAZINE-DM) 6.25-15 MG/5ML syrup Take 5 mLs by mouth 4 (four) times daily as needed for cough. 05/11/16  Yes Fayrene Helper, PA-C  HYDROcodone-homatropine (HYCODAN) 5-1.5 MG/5ML syrup Take 5 mLs by mouth every 6 (six) hours as needed (cough and severe pain). 05/19/16   Arthor Captain, PA-C  ibuprofen (ADVIL,MOTRIN) 600 MG tablet Take 1 tablet (600 mg total) by mouth every 8 (eight) hours as needed. Patient not taking: Reported on 05/19/2016 01/11/16   Rodolph Bong, MD  magic mouthwash w/lidocaine SOLN Take 5 mLs by mouth 4 (four) times  daily. Swish and swallow 05/19/16   Arthor Captain, PA-C   BP 165/95 mmHg  Pulse 99  Temp(Src) 98.8 F (37.1 C) (Oral)  Resp 16  Ht  (1.854 m)  Wt 144.697 kg  BMI 42.10 kg/m2  SpO2 99% Physical Exam  Constitutional: He appears well-developed and well-nourished. No distress.  HENT:  Head: Normocephalic and atraumatic.  Right Ear: Tympanic membrane normal. No drainage, swelling or tenderness.  Left Ear: Tympanic membrane normal. No drainage, swelling or tenderness.  Patient with swelling on the left side of his neck. Tender to palpation  over the left SCM. He has swelling, angry erythema with ulcerations along the arch of the palate, macerated exudates covering the tonsils. No unilateral swelling or uvular deviation.  Eyes: Conjunctivae and EOM are normal. Pupils are equal, round, and reactive to light. No scleral icterus.  Neck: Normal range of motion. Neck supple.    Cardiovascular: Normal rate, regular rhythm and normal heart sounds.   Pulmonary/Chest: Effort normal and breath sounds normal. No respiratory distress.  Abdominal: Soft. There is no tenderness.  Musculoskeletal: He exhibits no edema.  Neurological: He is alert.  Skin: Skin is warm and dry. He is not diaphoretic.  Psychiatric: His behavior is normal.  Nursing note and vitals reviewed.   ED Course  Procedures (including critical care time) Labs Review Labs Reviewed  BASIC METABOLIC PANEL - Abnormal; Notable for the following:    Glucose, Bld 109 (*)    All other components within normal limits  CBC WITH DIFFERENTIAL/PLATELET - Abnormal; Notable for the following:    WBC 12.2 (*)    Neutro Abs 8.0 (*)    Monocytes Absolute 1.2 (*)    Basophils Absolute 0.2 (*)    All other components within normal limits  RAPID STREP SCREEN (NOT AT [facility])  CULTURE, GROUP A STREP ([facility])  GC/CHLAMYDIA PROBE AMP ([place]) NOT AT [facility]    Imaging Review Ct Soft Tissue Neck W Contrast  05/19/2016  CLINICAL DATA:  Sore throat, dry cough, and left ear ache for 1 week. No relief despite antibiotic therapy. EXAM: CT NECK WITH CONTRAST TECHNIQUE: Multidetector CT imaging of the neck was performed using the standard protocol following the bolus administration of intravenous contrast. CONTRAST:  75mL ISOVUE-300 IOPAMIDOL (ISOVUE-300) INJECTION 61% COMPARISON:  CT head without contrast 10/24/2015 FINDINGS: Pharynx and larynx: There is marked hypertrophy and inflammatory change involving the lymphoid tissue of Waldeyer's ring. The palatine tonsils, adenoid tissue, and lingual  tonsils are all enlarged. There is no discrete abscess. Parapharyngeal fat is preserved. Extensive mucosal thickening continues along the aryepiglottic fold, left greater than right. Vocal cords are midline and symmetric. Salivary glands: The parotid glands are within normal limits bilaterally. The submandibular glands are unremarkable. Thyroid: Within normal limits. Lymph nodes: Extensive bilateral cervical adenopathy is present. Enlarged jugulodigastric, level 2, and level 3 lymph nodes are most prominent bilaterally. Inflammatory changes are worse on the left. Lateral retropharyngeal lymph nodes are enlarged bilaterally, worse on the left Vascular: No significant vascular lesions are present. There is no focal atherosclerotic change or stenosis. Limited intracranial: Limited imaging of the brain is unremarkable. Visualized orbits: Within normal limits Mastoids and visualized paranasal sinuses: Clear. Skeleton: There straightening of the normal cervical lordosis. Vertebral body heights and alignment are maintained. No focal lytic or blastic lesions are present. Upper chest: The lung apices are clear. The visualized superior mediastinum is unremarkable. IMPRESSION: 1. Extensive soft tissue swelling and irregular lymphoid enlargement within the oropharynx and  hypopharynx compatible with acute pharyngitis. 2. No discrete abscess. 3. Extensive inflammatory adenopathy, left greater than right. Electronically Signed   By: Marin Roberts M.D.   On: 05/19/2016 11:06   I have personally reviewed and evaluated these images and lab results as part of my medical decision-making.   EKG Interpretation None      MDM   Final diagnoses:  Pharyngitis    12:22 PM BP 165/95 mmHg  Pulse 99  Temp(Src) 98.8 F (37.1 C) (Oral)  Resp 16  Ht 6\' 1"  (1.854 m)  Wt 144.697 kg  BMI 42.10 kg/m2  SpO2 99% Patent imaging negative for abscess or phlegmon. I sent a g/c chlamydia . ? Oropharyngeal thrush secondary to  use of augmentin. Patient will take oral magic mouthwash with nystatin. Pain meds as well. Follow up closely with DR. Teoh. Able to tolerate fluids. Appears safe for discharge.     Arthor Captain, PA-C 05/20/16 1223  Cathren Laine, MD 05/20/16 [phone]

## 2016-05-19 NOTE — Discharge Instructions (Signed)

## 2016-05-19 NOTE — ED Notes (Signed)
Pt c/o sore throat, dry cough and left ear ache x1 week. Pt seen on 5/30 for same. Pt reports taking abx without relief. Denies SOB and fever.

## 2016-05-20 LAB — GC/CHLAMYDIA PROBE AMP (~~LOC~~) NOT AT ARMC
Chlamydia: NEGATIVE
Neisseria Gonorrhea: NEGATIVE

## 2016-05-21 LAB — CULTURE, GROUP A STREP (THRC)

## 2016-05-30 ENCOUNTER — Emergency Department (HOSPITAL_COMMUNITY): Payer: PRIVATE HEALTH INSURANCE

## 2016-05-30 ENCOUNTER — Encounter (HOSPITAL_COMMUNITY): Payer: Self-pay | Admitting: Emergency Medicine

## 2016-05-30 ENCOUNTER — Emergency Department (HOSPITAL_COMMUNITY)
Admission: EM | Admit: 2016-05-30 | Discharge: 2016-05-30 | Disposition: A | Discharge status: Home / Self Care | Payer: PRIVATE HEALTH INSURANCE | Attending: Emergency Medicine | Admitting: Emergency Medicine

## 2016-05-30 DIAGNOSIS — J029 Acute pharyngitis, unspecified: Secondary | ICD-10-CM | POA: Diagnosis not present

## 2016-05-30 DIAGNOSIS — R059 Cough, unspecified: Secondary | ICD-10-CM

## 2016-05-30 DIAGNOSIS — R05 Cough: Secondary | ICD-10-CM

## 2016-05-30 DIAGNOSIS — M791 Myalgia: Secondary | ICD-10-CM | POA: Insufficient documentation

## 2016-05-30 LAB — CBC WITH DIFFERENTIAL/PLATELET
BASOS ABS: 0 10*3/uL (ref 0.0–0.1)
Basophils Relative: 0 %
EOS ABS: 0.1 10*3/uL (ref 0.0–0.7)
EOS PCT: 1 %
HCT: 41.7 % (ref 39.0–52.0)
Hemoglobin: 14.1 g/dL (ref 13.0–17.0)
LYMPHS ABS: 3.6 10*3/uL (ref 0.7–4.0)
Lymphocytes Relative: 34 %
MCH: 28.4 pg (ref 26.0–34.0)
MCHC: 33.8 g/dL (ref 30.0–36.0)
MCV: 83.9 fL (ref 78.0–100.0)
MONO ABS: 0.7 10*3/uL (ref 0.1–1.0)
Monocytes Relative: 7 %
Neutro Abs: 6.3 10*3/uL (ref 1.7–7.7)
Neutrophils Relative %: 58 %
PLATELETS: 445 10*3/uL — AB (ref 150–400)
RBC: 4.97 MIL/uL (ref 4.22–5.81)
RDW: 13.8 % (ref 11.5–15.5)
WBC: 10.6 10*3/uL — AB (ref 4.0–10.5)

## 2016-05-30 LAB — I-STAT CHEM 8, ED
BUN: 15 mg/dL (ref 6–20)
CALCIUM ION: 1.2 mmol/L (ref 1.12–1.23)
CHLORIDE: 102 mmol/L (ref 101–111)
Creatinine, Ser: 1.1 mg/dL (ref 0.61–1.24)
Glucose, Bld: 97 mg/dL (ref 65–99)
HCT: 44 % (ref 39.0–52.0)
HEMOGLOBIN: 15 g/dL (ref 13.0–17.0)
Potassium: 3.6 mmol/L (ref 3.5–5.1)
SODIUM: 141 mmol/L (ref 135–145)
TCO2: 27 mmol/L (ref 0–100)

## 2016-05-30 MED ORDER — AEROCHAMBER PLUS FLO-VU LARGE MISC
1.0000 | Freq: Once | Status: AC
  Administered 2016-05-30: 1
  Filled 2016-05-30 (×2): qty 1

## 2016-05-30 MED ORDER — IPRATROPIUM-ALBUTEROL 0.5-2.5 (3) MG/3ML IN SOLN
3.0000 mL | Freq: Once | RESPIRATORY_TRACT | Status: AC
  Administered 2016-05-30: 3 mL via RESPIRATORY_TRACT
  Filled 2016-05-30: qty 3

## 2016-05-30 MED ORDER — ALBUTEROL SULFATE HFA 108 (90 BASE) MCG/ACT IN AERS
2.0000 | INHALATION_SPRAY | RESPIRATORY_TRACT | Status: DC | PRN
  Administered 2016-05-30: 2 via RESPIRATORY_TRACT
  Filled 2016-05-30: qty 6.7

## 2016-05-30 NOTE — ED Provider Notes (Signed)
CSN: [id number]     Arrival date & time 05/30/16  0022 History   First MD Initiated Contact with Patient 05/30/16 0232     Chief Complaint  Patient presents with  . Cough     (Consider location/radiation/quality/duration/timing/severity/associated sxs/prior Treatment) HPI Comments: This is a 35 year old male who presents with persistent pharyngitis, chills, nonproductive cough.  He has been seen by ENT and his primary care physician.  He is just finished a course of steroids.  He is still taking clindamycin antibiotic for his pharyngitis.  He states he feels her short of breath even at rest.  Has no history of asthma.  He does take lisinopril for hypertension which is his only known medical diagnosis .  He does have a very strong family history of asthma, heart disease, cancer.  Patient is a 34 y.o. male presenting with cough. The history is provided by the patient.  Cough Cough characteristics:  Non-productive Severity:  Moderate Onset quality:  Gradual Timing:  Intermittent Progression:  Worsening Chronicity:  New Smoker: yes   Relieved by:  Nothing Worsened by:  Nothing tried Associated symptoms: myalgias, shortness of breath and sore throat   Associated symptoms: no chills, no fever and no headaches     Past Medical History  Diagnosis Date  . Hypertension    History reviewed. No pertinent past surgical history. Family History  Problem Relation Age of Onset  . Hypertension Mother   . Cancer Mother     pancreas  . Hyperlipidemia Mother   . Hypertension Father   . Hyperlipidemia Father   . Heart disease Sister   . Diabetes Neg Hx   . Stroke Neg Hx    Social History  Substance Use Topics  . Smoking status: Former Smoker -- 0.25 packs/day for 10 years  . Smokeless tobacco: Never Used  . Alcohol Use: No    Review of Systems  Constitutional: Negative for fever and chills.  HENT: Positive for sore throat. Negative for trouble swallowing.   Respiratory: Positive for  cough and shortness of breath.   Musculoskeletal: Positive for myalgias.  Neurological: Negative for dizziness and headaches.  All other systems reviewed and are negative.     Allergies  Review of patient's allergies indicates no known allergies.  Home Medications   Prior to Admission medications   Medication Sig Start Date End Date Taking? Authorizing Provider  CLINDAMYCIN HCL PO Take 1 capsule by mouth 4 (four) times daily.   Yes Historical Provider, MD  ibuprofen (ADVIL,MOTRIN) 800 MG tablet Take 800 mg by mouth every 8 (eight) hours as needed for moderate pain.   Yes Historical Provider, MD  lisinopril (PRINIVIL,ZESTRIL) 20 MG tablet One tablet by mouth daily for blood pressure control. 05/15/16  Yes Sean Hommel, DO  chlorhexidine (PERIDEX) 0.12 % solution Use as directed 15 mLs in the mouth or throat 2 (two) times daily. Patient not taking: Reported on 05/30/2016 05/11/16   Fayrene Helper, PA-C  HYDROcodone-homatropine [facility]) 5-1.5 MG/5ML syrup Take 5 mLs by mouth every 6 (six) hours as needed (cough and severe pain). Patient not taking: Reported on 05/30/2016 05/19/16   Arthor Captain, PA-C  ibuprofen (ADVIL,MOTRIN) 600 MG tablet Take 1 tablet (600 mg total) by mouth every 8 (eight) hours as needed. Patient not taking: Reported on 05/19/2016 01/11/16   Rodolph Bong, MD  magic mouthwash w/lidocaine SOLN Take 5 mLs by mouth 4 (four) times daily. Swish and swallow Patient not taking: Reported on 05/30/2016 05/19/16   Arthor Captain,  PA-C  methocarbamol (ROBAXIN) 500 MG tablet Take 1 tablet (500 mg total) by mouth every 8 (eight) hours as needed for muscle spasms. Patient not taking: Reported on 05/30/2016 01/11/16   Rodolph Bong, MD  promethazine-dextromethorphan (PROMETHAZINE-DM) 6.25-15 MG/5ML syrup Take 5 mLs by mouth 4 (four) times daily as needed for cough. Patient not taking: Reported on 05/30/2016 05/11/16   Fayrene Helper, PA-C   BP 131/83 mmHg  Pulse 94  Temp(Src) 98.4 F (36.9 C) (Oral)   Resp 15  SpO2 95% Physical Exam  Constitutional: He appears well-developed and well-nourished.  HENT:  Head: Normocephalic.  Right Ear: External ear normal.  Left Ear: External ear normal.  Mouth/Throat: Uvula is midline. No uvula swelling. Oropharyngeal exudate, posterior oropharyngeal edema and posterior oropharyngeal erythema present.  Eyes: Pupils are equal, round, and reactive to light.  Neck: Normal range of motion.  Cardiovascular: Normal rate and regular rhythm.   Pulmonary/Chest: Effort normal and breath sounds normal. No respiratory distress. He has no wheezes. He exhibits no tenderness.  Abdominal: Soft.  Musculoskeletal: Normal range of motion.  Lymphadenopathy:    He has cervical adenopathy.  Neurological: He is alert.  Skin: Skin is warm. No erythema.  Nursing note and vitals reviewed.   ED Course  Procedures (including critical care time) Labs Review Labs Reviewed  CBC WITH DIFFERENTIAL/PLATELET - Abnormal; Notable for the following:    WBC 10.6 (*)    Platelets 445 (*)    All other components within normal limits  I-STAT CHEM 8, ED    Imaging Review Dg Chest 2 View  05/30/2016  CLINICAL DATA:  Acute onset of nonproductive cough, shortness of breath and wheezing. Chest congestion. Initial encounter. EXAM: CHEST  2 VIEW COMPARISON:  Chest radiograph performed 05/11/2016 FINDINGS: The lungs are well-aerated and clear. There is no evidence of focal opacification, pleural effusion or pneumothorax. The heart is normal in size; the mediastinal contour is within normal limits. No acute osseous abnormalities are seen. IMPRESSION: No acute cardiopulmonary process seen. Electronically Signed   By: Roanna Raider M.D.   On: 05/30/2016 03:42   I have personally reviewed and evaluated these images and lab results as part of my medical decision-making.   EKG Interpretation None     Patient's lab results have been reviewed, as well as his chest x-ray well within normal  parameters.  He's been on antibiotic for days.  I suggest finish the course.  He also was given albuterol neb treatment in the emergency department with significant relief of his coughing.  He'll be discharged home within inhaler to use as follows 2 puffs every 4-6 hours while awake for 2 days then as needed.  He is supposed to see his PCP next week, which we agree time for follow-up as well MDM   Final diagnoses:  Cough         Earley Favor, NP 05/30/16 7829  Dione Booze, MD 05/30/16 [phone]

## 2016-05-30 NOTE — Discharge Instructions (Signed)
As discussed.  Tonight, your chest x-ray is normal.  No pneumonia evident .  Your lab work was in within normal parameters as well .  Continue taking the antibiotics until all tablets are completed .  You have been given an inhaler.  Please uses as follows 2 puffs every 4-6 hours while awake for 2 days then as needed thereafter .  If you do wake during the night and feels short of breath.  Please use your inhaler do not set an alarm clock to wake up in the middle of the night   Cough, Adult Coughing is a reflex that clears your throat and your airways. Coughing helps to heal and protect your lungs. It is normal to cough occasionally, but a cough that happens with other symptoms or lasts a long time may be a sign of a condition that needs treatment. A cough may last only 2-3 weeks (acute), or it may last longer than 8 weeks (chronic). CAUSES Coughing is commonly caused by:  Breathing in substances that irritate your lungs.  A viral or bacterial respiratory infection.  Allergies.  Asthma.  Postnasal drip.  Smoking.  Acid backing up from the stomach into the esophagus (gastroesophageal reflux).  Certain medicines.  Chronic lung problems, including COPD (or rarely, lung cancer).  Other medical conditions such as heart failure. HOME CARE INSTRUCTIONS  Pay attention to any changes in your symptoms. Take these actions to help with your discomfort:  Take medicines only as told by your health care provider.  If you were prescribed an antibiotic medicine, take it as told by your health care provider. Do not stop taking the antibiotic even if you start to feel better.  Talk with your health care provider before you take a cough suppressant medicine.  Drink enough fluid to keep your urine clear or pale yellow.  If the air is dry, use a cold steam vaporizer or humidifier in your bedroom or your home to help loosen secretions.  Avoid anything that causes you to cough at work or at  home.  If your cough is worse at night, try sleeping in a semi-upright position.  Avoid cigarette smoke. If you smoke, quit smoking. If you need help quitting, ask your health care provider.  Avoid caffeine.  Avoid alcohol.  Rest as needed. SEEK MEDICAL CARE IF:   You have new symptoms.  You cough up pus.  Your cough does not get better after 2-3 weeks, or your cough gets worse.  You cannot control your cough with suppressant medicines and you are losing sleep.  You develop pain that is getting worse or pain that is not controlled with pain medicines.  You have a fever.  You have unexplained weight loss.  You have night sweats. SEEK IMMEDIATE MEDICAL CARE IF:  You cough up blood.  You have difficulty breathing.  Your heartbeat is very fast.   This information is not intended to replace advice given to you by your health care provider. Make sure you discuss any questions you have with your health care provider.   Document Released: 05/31/2011 Document Revised: 08/23/2015 Document Reviewed: 02/08/2015 Elsevier Interactive Patient Education [employer]

## 2016-05-30 NOTE — ED Notes (Signed)
Pt states he has a nonproductive cough, bilateral earache, and shortness of breath  Pt states these sxs started over the weekend  Pt states he has been dealing with a sore throat for the past 3 weeks and was seen by an ENT last week and was put on Prednisone that he completed on Monday and Clindomycin that he is currently still taking  Pt states it hurts his back when he coughs

## 2016-06-07 ENCOUNTER — Encounter: Payer: Self-pay | Admitting: Family Medicine

## 2016-06-07 ENCOUNTER — Ambulatory Visit (INDEPENDENT_AMBULATORY_CARE_PROVIDER_SITE_OTHER): Payer: PRIVATE HEALTH INSURANCE | Admitting: Family Medicine

## 2016-06-07 VITALS — BP 148/106 | HR 92 | Wt 322.0 lb

## 2016-06-07 DIAGNOSIS — R05 Cough: Secondary | ICD-10-CM

## 2016-06-07 DIAGNOSIS — Z0000 Encounter for general adult medical examination without abnormal findings: Secondary | ICD-10-CM

## 2016-06-07 DIAGNOSIS — R059 Cough, unspecified: Secondary | ICD-10-CM

## 2016-06-07 LAB — CBC
HEMATOCRIT: 39.6 % (ref 38.5–50.0)
Hemoglobin: 13.5 g/dL (ref 13.2–17.1)
MCH: 28.2 pg (ref 27.0–33.0)
MCHC: 34.1 g/dL (ref 32.0–36.0)
MCV: 82.8 fL (ref 80.0–100.0)
MPV: 8.9 fL (ref 7.5–12.5)
Platelets: 288 10*3/uL (ref 140–400)
RBC: 4.78 MIL/uL (ref 4.20–5.80)
RDW: 14.9 % (ref 11.0–15.0)
WBC: 6.3 10*3/uL (ref 3.8–10.8)

## 2016-06-07 MED ORDER — METOPROLOL SUCCINATE ER 50 MG PO TB24: 50.0000 mg | ORAL_TABLET | Freq: Every day | ORAL | Status: DC

## 2016-06-07 NOTE — Progress Notes (Signed)
CC: Henry Chandler is a 35 y.o. male is here for Annual Exam   Subjective: HPI:  Colonoscopy: no current indication Prostate: Discussed screening risks/beneifts with patient today no screening until age 22  Influenza Vaccine: not indicated Pneumovax: not indicated Td/Tdap: UTD Zoster: (Start 34 yo)  Requesting complete physical exam no longer experiencing any sore throat. He's been complaining of cough is nonproductive as been present throughout the day for the last 2 weeks. He's had a normal chest x-ray but no response to albuterol inhaler or nebulizer. Symptoms were present even while he was taking prednisone.   Review of Systems - General ROS: negative for - chills, fever, night sweats, weight gain or weight loss Ophthalmic ROS: negative for - decreased vision Psychological ROS: negative for - anxiety or depression ENT ROS: negative for - hearing change, nasal congestion, tinnitus or allergies Hematological and Lymphatic ROS: negative for - bleeding problems, bruising or swollen lymph nodes Breast ROS: negative Respiratory ROS: no wheezing Cardiovascular ROS: no chest pain or dyspnea on exertion Gastrointestinal ROS: no abdominal pain, change in bowel habits, or black or bloody stools Genito-Urinary ROS: negative for - genital discharge, genital ulcers, incontinence or abnormal bleeding from genitals Musculoskeletal ROS: negative for - joint pain or muscle pain Neurological ROS: negative for - headaches or memory loss Dermatological ROS: negative for lumps, mole changes, rash and skin lesion changes  Past Medical History  Diagnosis Date  . Hypertension     No past surgical history on file. Family History  Problem Relation Age of Onset  . Hypertension Mother   . Cancer Mother     pancreas  . Hyperlipidemia Mother   . Hypertension Father   . Hyperlipidemia Father   . Heart disease Sister   . Diabetes Neg Hx   . Stroke Neg Hx     Social History   Social History  .  Marital Status: Single    Spouse Name: N/A  . Number of Children: N/A  . Years of Education: GED   Occupational History  . Forklift operator    Social History Main Topics  . Smoking status: Former Smoker -- 0.25 packs/day for 10 years  . Smokeless tobacco: Never Used  . Alcohol Use: No  . Drug Use: No  . Sexual Activity: Yes    Birth Control/ Protection: Pill     Comment: 1 or less daly   Other Topics Concern  . Not on file   Social History Narrative   Regular exercise-yes   Caffeine Use-yes     Objective: BP 148/106 mmHg  Pulse 92  Wt 322 lb (146.058 kg)  SpO2 94%  General: No Acute Distress HEENT: Atraumatic, normocephalic, conjunctivae normal without scleral icterus.  No nasal discharge, hearing grossly intact, TMs with good landmarks bilaterally with no middle ear abnormalities, posterior pharynx clear without oral lesions. Neck: Supple, trachea midline, no cervical nor supraclavicular adenopathy. Pulmonary: Clear to auscultation bilaterally without wheezing, rhonchi, nor rales. Cardiac: Regular rate and rhythm.  No murmurs, rubs, nor gallops. No peripheral edema.  2+ peripheral pulses bilaterally. Abdomen: Bowel sounds normal.  No masses.  Non-tender without rebound.  Negative Murphy's sign. MSK: Grossly intact, no signs of weakness.  Full strength throughout upper and lower extremities.  Full ROM in upper and lower extremities.  No midline spinal tenderness. Neuro: Gait unremarkable, CN II-XII grossly intact.  C5-C6 Reflex 2/4 Bilaterally, L4 Reflex 2/4 Bilaterally.  Cerebellar function intact. Skin: No rashes. Psych: Alert and oriented to person/place/time.  Thought  process normal. No anxiety/depression. Assessment & Plan: Henry Chandler was seen today for annual exam.  Diagnoses and all orders for this visit:  Annual physical exam -     Lipid panel -     CBC  Cough  Other orders -     metoprolol succinate (TOPROL-XL) 50 MG 24 hr tablet; Take 1 tablet (50 mg total)  by mouth daily. Take with or immediately following a meal.   Healthy lifestyle interventions including but not limited to regular exercise, a healthy low fat diet, moderation of salt intake, the dangers of tobacco/alcohol/recreational drug use, nutrition supplementation, and accident avoidance were discussed with the patient and a handout was provided for future reference.  switching lisinopril to metoprolol due to suspicion that cough is due to lisinopril.   I intend to call him on Monday to see how is doing.  Return if symptoms worsen or fail to improve.

## 2016-06-08 LAB — LIPID PANEL
CHOL/HDL RATIO: 3.3 ratio (ref <=5.0)
Cholesterol: 108 mg/dL — ABNORMAL LOW (ref 125–200)
HDL: 33 mg/dL — AB (ref >=40)
LDL CALC: 61 mg/dL (ref <130)
Triglycerides: 72 mg/dL (ref <150)
VLDL: 14 mg/dL (ref <30)

## 2016-07-02 ENCOUNTER — Encounter: Payer: Self-pay | Admitting: Family Medicine

## 2016-07-02 ENCOUNTER — Ambulatory Visit (INDEPENDENT_AMBULATORY_CARE_PROVIDER_SITE_OTHER): Payer: PRIVATE HEALTH INSURANCE | Admitting: Family Medicine

## 2016-07-02 DIAGNOSIS — Z0289 Encounter for other administrative examinations: Secondary | ICD-10-CM

## 2016-07-02 NOTE — Progress Notes (Signed)
No show 07/02/2016

## 2016-07-08 ENCOUNTER — Encounter: Payer: Self-pay | Admitting: Family Medicine

## 2016-07-08 ENCOUNTER — Ambulatory Visit (INDEPENDENT_AMBULATORY_CARE_PROVIDER_SITE_OTHER): Payer: PRIVATE HEALTH INSURANCE | Admitting: Family Medicine

## 2016-07-08 VITALS — BP 161/122 | HR 87 | Wt 309.0 lb

## 2016-07-08 DIAGNOSIS — Z202 Contact with and (suspected) exposure to infections with a predominantly sexual mode of transmission: Secondary | ICD-10-CM | POA: Diagnosis not present

## 2016-07-08 DIAGNOSIS — I10 Essential (primary) hypertension: Secondary | ICD-10-CM

## 2016-07-08 LAB — HIV ANTIBODY (ROUTINE TESTING W REFLEX): HIV 1&2 Ab, 4th Generation: NONREACTIVE

## 2016-07-08 LAB — HEPATITIS C ANTIBODY: HCV AB: NEGATIVE

## 2016-07-08 MED ORDER — METOPROLOL SUCCINATE ER 100 MG PO TB24: 100.0000 mg | ORAL_TABLET | Freq: Every day | ORAL | 2 refills | Status: DC

## 2016-07-08 NOTE — Progress Notes (Signed)
CC: Henry Chandler is a 35 y.o. male is here for Exposure to STD and Hypertension   Subjective: HPI:  1 of his sexual partners contacted him last week that she came down with a herpes outbreak. He tells me he's not had any lesions recently or remotely. He is never been exposed to the herpes virus in the past to his knowledge. He wants  to check for this infection. He denies any genital lesions, penile discharge or dysuria.  He tells me is tolerating metoprolol with no outside blood pressures to report. He denies chest pain shortness of breath orthopnea nor peripheral edema  Review Of Systems Outlined In HPI  Past Medical History:  Diagnosis Date  . Hypertension     No past surgical history on file. Family History  Problem Relation Age of Onset  . Hypertension Mother   . Cancer Mother     pancreas  . Hyperlipidemia Mother   . Hypertension Father   . Hyperlipidemia Father   . Heart disease Sister   . Diabetes Neg Hx   . Stroke Neg Hx     Social History   Social History  . Marital status: Single    Spouse name: N/A  . Number of children: N/A  . Years of education: GED   Occupational History  . Teaching laboratory technician   Social History Main Topics  . Smoking status: Former Smoker    Packs/day: 0.25    Years: 10.00  . Smokeless tobacco: Never Used  . Alcohol use No  . Drug use: No  . Sexual activity: Yes    Birth control/ protection: Pill     Comment: 1 or less daly   Other Topics Concern  . Not on file   Social History Narrative   Regular exercise-yes   Caffeine Use-yes     Objective: BP (!) 161/122 (BP Location: Left Arm, Patient Position: Sitting, Cuff Size: Large)   Pulse 87   Wt (!) 309 lb (140.2 kg)   BMI 40.77 kg/m   Vital signs reviewed. General: Alert and Oriented, No Acute Distress HEENT: Pupils equal, round, reactive to light. Conjunctivae clear.  External ears unremarkable.  Moist mucous membranes. Lungs: Clear and comfortable work of  breathing, speaking in full sentences without accessory muscle use. Cardiac: Regular rate and rhythm.  Neuro: CN II-XII grossly intact, gait normal. Extremities: No peripheral edema.  Strong peripheral pulses.  Mental Status: No depression, anxiety, nor agitation. Logical though process. Skin: Warm and dry.  Assessment & Plan: Henry Chandler was seen today for exposure to std and hypertension.  Diagnoses and all orders for this visit:  Essential hypertension, benign -     HIV antibody -     HSV 2 antibody, IgG -     HSV 1 antibody, IgG -     RPR -     Hepatitis C antibody -     GC/Chlamydia Probe Amp  Other orders -     metoprolol succinate (TOPROL-XL) 100 MG 24 hr tablet; Take 1 tablet (100 mg total) by mouth daily. Take with or immediately following a meal.   Checking for HSV 1 and 2, I discussed common other sexually transmitted diseases that he might want to get checked for. He would like to get checked for all the above. Essential hypertension: Uncontrolled chronic condition increasing metoprolol.   Return in about 4 weeks (around 08/05/2016) for Blood pressure follow-up.

## 2016-07-09 ENCOUNTER — Telehealth: Payer: Self-pay | Admitting: Family Medicine

## 2016-07-09 DIAGNOSIS — R768 Other specified abnormal immunological findings in serum: Secondary | ICD-10-CM | POA: Insufficient documentation

## 2016-07-09 DIAGNOSIS — R7689 Other specified abnormal immunological findings in serum: Secondary | ICD-10-CM | POA: Insufficient documentation

## 2016-07-09 LAB — HSV 1 ANTIBODY, IGG: HSV 1 Glycoprotein G Ab, IgG: <0.9 {index}

## 2016-07-09 LAB — GC/CHLAMYDIA PROBE AMP
CT Probe RNA: NOT DETECTED
GC Probe RNA: NOT DETECTED

## 2016-07-09 LAB — HSV 2 ANTIBODY, IGG: HSV 2 Glycoprotein G Ab, IgG: 17 {index} — ABNORMAL HIGH

## 2016-07-09 LAB — RPR

## 2016-07-09 MED ORDER — VALACYCLOVIR HCL 1 G PO TABS: 1000.0000 mg | ORAL_TABLET | Freq: Two times a day (BID) | ORAL | 0 refills | Status: DC

## 2016-07-09 NOTE — Telephone Encounter (Signed)
vm left for pt to return call to clinic  

## 2016-07-09 NOTE — Telephone Encounter (Signed)
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 It would be wise to keep a prescription of Valtrex on hand just in case he starts to notice any painful blisters in the genital region anytime in the future. I sent this prescription to his pharmacy. Thankfully all of his other STD tests were normal

## 2016-07-10 NOTE — Telephone Encounter (Signed)
Patient notified of results.

## 2016-07-17 ENCOUNTER — Telehealth: Payer: Self-pay | Admitting: Family Medicine

## 2016-07-17 ENCOUNTER — Ambulatory Visit (INDEPENDENT_AMBULATORY_CARE_PROVIDER_SITE_OTHER): Payer: PRIVATE HEALTH INSURANCE

## 2016-07-17 ENCOUNTER — Ambulatory Visit (INDEPENDENT_AMBULATORY_CARE_PROVIDER_SITE_OTHER): Payer: PRIVATE HEALTH INSURANCE | Admitting: Family Medicine

## 2016-07-17 ENCOUNTER — Telehealth: Payer: Self-pay

## 2016-07-17 ENCOUNTER — Encounter: Payer: Self-pay | Admitting: Family Medicine

## 2016-07-17 VITALS — BP 164/112 | HR 96 | Temp 98.7°F | Wt 316.0 lb

## 2016-07-17 DIAGNOSIS — R10829 Rebound abdominal tenderness, unspecified site: Secondary | ICD-10-CM

## 2016-07-17 DIAGNOSIS — R1031 Right lower quadrant pain: Secondary | ICD-10-CM

## 2016-07-17 DIAGNOSIS — K529 Noninfective gastroenteritis and colitis, unspecified: Principal | ICD-10-CM

## 2016-07-17 DIAGNOSIS — K6389 Other specified diseases of intestine: Secondary | ICD-10-CM | POA: Insufficient documentation

## 2016-07-17 MED ORDER — MELOXICAM 15 MG PO TABS: 15.0000 mg | ORAL_TABLET | Freq: Every day | ORAL | 0 refills | Status: DC

## 2016-07-17 MED ORDER — TRAMADOL HCL 50 MG PO TABS: 50.0000 mg | ORAL_TABLET | Freq: Three times a day (TID) | ORAL | 0 refills | Status: DC | PRN

## 2016-07-17 MED ORDER — IOPAMIDOL (ISOVUE-300) INJECTION 61%
100.0000 mL | Freq: Once | INTRAVENOUS | Status: AC | PRN
  Administered 2016-07-17: 100 mL via INTRAVENOUS

## 2016-07-17 NOTE — Telephone Encounter (Signed)
Pt notified Rx faxed to [facility] on [address]

## 2016-07-17 NOTE — Telephone Encounter (Signed)
Will you please let patient know that his CT scan revealed that he has a condition called epiploic appendagitis.  This is not to be confused with appendicitis.  His condition is painful but not a threat to his health.  It will resolve over the next week so we need to focus on pain control.  I'd recommend he take meloxicam every day and tramadol as needed. A Rx is printed off for this.  Please spell out this condition so that he can look it up online. No surgery is needed.

## 2016-07-17 NOTE — Progress Notes (Signed)
CC: Henry Chandler is a 35 y.o. male is here for Abdominal Pain (left lower)   Subjective: HPI: There is instructed last week he's been expansion right lower quadrant pain. It subsided over the weekend however slowly is worsening since Monday. It's worse when he goes over a speed bump, when he sitting, when he coughs, or he bends over. He doesn't he feels a ball is sitting in his right lower quadrant. He's never had this before. Symptoms are moderate in severity and only barely improved with taking ibuprofen. No other interventions as of yet. He denies any fevers, chills, irregular bowel movements, decreased appetite or nausea. No constipation or diarrhea. He denies any genitourinary complaints. He denies any recent or remote trauma.    Review Of Systems Outlined In HPI  Past Medical History:  Diagnosis Date  . Hypertension     No past surgical history on file. Family History  Problem Relation Age of Onset  . Hypertension Mother   . Cancer Mother     pancreas  . Hyperlipidemia Mother   . Hypertension Father   . Hyperlipidemia Father   . Heart disease Sister   . Diabetes Neg Hx   . Stroke Neg Hx     Social History   Social History  . Marital status: Single    Spouse name: N/A  . Number of children: N/A  . Years of education: GED   Occupational History  . Teaching laboratory technician   Social History Main Topics  . Smoking status: Former Smoker    Packs/day: 0.25    Years: 10.00  . Smokeless tobacco: Never Used  . Alcohol use No  . Drug use: No  . Sexual activity: Yes    Birth control/ protection: Pill     Comment: 1 or less daly   Other Topics Concern  . Not on file   Social History Narrative   Regular exercise-yes   Caffeine Use-yes     Objective: BP (!) 164/112   Pulse 96   Temp 98.7 F (37.1 C)   Wt (!) 316 lb (143.3 kg)   BMI 41.69 kg/m   General: Alert and Oriented, No Acute Distress However appears uncomfortable HEENT: Pupils equal, round, reactive  to light. Conjunctivae clearMoist mucous membranes Lungs: Clear to auscultation bilaterally, no wheezing/ronchi/rales.  Comfortable work of breathing. Good air movement. Cardiac: Regular rate and rhythm. Normal S1/S2.  No murmurs, rubs, nor gallops.   Abdomen: Normal bowel soundSoft with right lower quadrant tenderness to both direct palpation and with rebound tenderness. Psoas sign is positive. Extremities: No peripheral edema.  Strong peripheral pulses.  Mental Status: No depression, anxiety, nor agitation. Skin: Warm and dry.  Assessment & Plan: Bradden was seen today for abdominal pain.  Diagnoses and all orders for this visit:  Chronic RLQ pain  Rebound tenderness    right lower quadrant pain: He is a CT scan with contrast to rule out inguinal hernia versus appendicitis. Ultimate Plan will be based on these results.  We are going to arrange to have this done downstairs today.  Thankfully his pain is only due to epiploic appendicitis, will try to manage this with meloxicam and tramadol.  No Follow-up on file.

## 2016-07-18 NOTE — Telephone Encounter (Signed)
Work note in your in box.

## 2016-07-18 NOTE — Telephone Encounter (Signed)
Pt.notified

## 2016-11-20 ENCOUNTER — Encounter: Payer: Self-pay | Admitting: Family Medicine

## 2016-11-20 ENCOUNTER — Ambulatory Visit (INDEPENDENT_AMBULATORY_CARE_PROVIDER_SITE_OTHER): Payer: PRIVATE HEALTH INSURANCE | Admitting: Family Medicine

## 2016-11-20 ENCOUNTER — Ambulatory Visit (INDEPENDENT_AMBULATORY_CARE_PROVIDER_SITE_OTHER): Payer: PRIVATE HEALTH INSURANCE

## 2016-11-20 VITALS — BP 176/126 | HR 77 | Temp 98.5°F | Wt 332.0 lb

## 2016-11-20 DIAGNOSIS — F17200 Nicotine dependence, unspecified, uncomplicated: Secondary | ICD-10-CM | POA: Diagnosis not present

## 2016-11-20 DIAGNOSIS — I10 Essential (primary) hypertension: Secondary | ICD-10-CM | POA: Diagnosis not present

## 2016-11-20 DIAGNOSIS — R0781 Pleurodynia: Secondary | ICD-10-CM | POA: Diagnosis not present

## 2016-11-20 DIAGNOSIS — M940 Chondrocostal junction syndrome [Tietze]: Secondary | ICD-10-CM

## 2016-11-20 DIAGNOSIS — R109 Unspecified abdominal pain: Secondary | ICD-10-CM

## 2016-11-20 MED ORDER — TRAMADOL HCL 50 MG PO TABS: 50.0000 mg | ORAL_TABLET | Freq: Three times a day (TID) | ORAL | 0 refills | Status: DC | PRN

## 2016-11-20 MED ORDER — METOPROLOL SUCCINATE ER 100 MG PO TB24: 100.0000 mg | ORAL_TABLET | Freq: Every day | ORAL | 2 refills | Status: DC

## 2016-11-20 MED ORDER — BENZONATATE 200 MG PO CAPS: 200.0000 mg | ORAL_CAPSULE | Freq: Three times a day (TID) | ORAL | 0 refills | Status: DC | PRN

## 2016-11-20 NOTE — Patient Instructions (Signed)
Thank you for coming in today. Restart blood pressure medicine.  Use tylenol for pain.  Use tessalon for cough as needed.  Take tramadol for severe pain and cough at night.  This medicine will make you sleepy so take with caution.   Follow up with Charlie in about 1 month.

## 2016-11-20 NOTE — Progress Notes (Signed)
Henry Chandler is a 35 y.o. male who presents to [facility] today for left abdominal pain.  Patient notes about 1.5 week history of left anterior to lateral abdominal/flank pain. He notes the pain is worse with deep inspiration and cough laughing and sneezing. He denies any injury. He's tried some ibuprofen which helped a little. He notes he does continue to have a mild nonproductive cough which is quite bothersome with the pain.  He notes he has a history of hypertension and has run out of his blood pressure medications. Aside from the current abdominal pain he denies any chest pain palpitations or shortness of breath lightheadedness or dizziness.   Past Medical History:  Diagnosis Date  . Hypertension    No past surgical history on file. Social History  Substance Use Topics  . Smoking status: Former Smoker    Packs/day: 0.25    Years: 10.00  . Smokeless tobacco: Never Used  . Alcohol use No     ROS:  As above   Medications: Current Outpatient Prescriptions  Medication Sig Dispense Refill  . meloxicam (MOBIC) 15 MG tablet Take 1 tablet (15 mg total) by mouth daily. To try and reduce pain. 30 tablet 0  . methocarbamol (ROBAXIN) 500 MG tablet Take 1 tablet (500 mg total) by mouth every 8 (eight) hours as needed for muscle spasms. 30 tablet 0  . metoprolol succinate (TOPROL-XL) 100 MG 24 hr tablet Take 1 tablet (100 mg total) by mouth daily. Take with or immediately following a meal. 30 tablet 2  . valACYclovir (VALTREX) 1000 MG tablet Take 1 tablet (1,000 mg total) by mouth 2 (two) times daily. Use only if a herpes outbreak occurs. 20 tablet 0  . benzonatate (TESSALON) 200 MG capsule Take 1 capsule (200 mg total) by mouth 3 (three) times daily as needed for cough. 45 capsule 0  . chlorhexidine (PERIDEX) 0.12 % solution Use as directed 15 mLs in the mouth or throat 2 (two) times daily. (Patient not taking: Reported on 11/20/2016) 120 mL 0    . ibuprofen (ADVIL,MOTRIN) 800 MG tablet Take 800 mg by mouth every 8 (eight) hours as needed for moderate pain.    . traMADol (ULTRAM) 50 MG tablet Take 1 tablet (50 mg total) by mouth every 8 (eight) hours as needed. 30 tablet 0   No current facility-administered medications for this visit.    Allergies  Allergen Reactions  . Lisinopril     cough     Exam:  BP (!) 176/126   Pulse 77   Temp 98.5 F (36.9 C) (Oral)   Wt (!) 332 lb (150.6 kg)   BMI 43.80 kg/m  General: Well Developed, well nourished, and in no acute distress.  Neuro/Psych: Alert and oriented x3, extra-ocular muscles intact, able to move all 4 extremities, sensation grossly intact. Skin: Warm and dry, no rashes noted.  Respiratory: Not using accessory muscles, speaking in full sentences, trachea midline. To auscultation bilaterally normal work of breathing Cardiovascular: Pulses palpable, no extremity edema. The rate and rhythm no murmurs rubs or gallops Abdomen: Does not appear distended.  Tender to palpation left anterior superior abdomen or left inferior lateral anterior chest wall. Patient is tender to palpation overlying the false ribs. Pain is worse with deep inspiration.  X-ray left ribs shows no fractures. Awaiting formal radiology review   No results found for this or any previous visit (from the past 48 hour(s)). No results found.    Assessment  and Plan: 35 y.o. male with costochondritis. Treat with relative rest Tylenol Tessalon and tramadol.  Hypertension: Refill metoprolol. Follow-up with PCP in the near future.    Orders Placed This Encounter  Procedures  . DG Ribs Unilateral W/Chest Left    Order Specific Question:   Reason for exam:    Answer:   pain inferior lateral ribs    Order Specific Question:   Preferred imaging location?    Answer:   [facility]    Discussed warning signs or symptoms. Please see discharge instructions. Patient expresses understanding.

## 2017-02-05 ENCOUNTER — Encounter (HOSPITAL_COMMUNITY): Payer: Self-pay | Admitting: Emergency Medicine

## 2017-02-05 ENCOUNTER — Emergency Department (HOSPITAL_COMMUNITY)
Admission: EM | Admit: 2017-02-05 | Discharge: 2017-02-05 | Disposition: A | Discharge status: Home / Self Care | Payer: PRIVATE HEALTH INSURANCE | Attending: Emergency Medicine | Admitting: Emergency Medicine

## 2017-02-05 DIAGNOSIS — R030 Elevated blood-pressure reading, without diagnosis of hypertension: Secondary | ICD-10-CM | POA: Insufficient documentation

## 2017-02-05 DIAGNOSIS — F17200 Nicotine dependence, unspecified, uncomplicated: Secondary | ICD-10-CM | POA: Diagnosis not present

## 2017-02-05 DIAGNOSIS — I10 Essential (primary) hypertension: Secondary | ICD-10-CM | POA: Diagnosis not present

## 2017-02-05 DIAGNOSIS — H66002 Acute suppurative otitis media without spontaneous rupture of ear drum, left ear: Secondary | ICD-10-CM | POA: Diagnosis not present

## 2017-02-05 DIAGNOSIS — Z79899 Other long term (current) drug therapy: Secondary | ICD-10-CM | POA: Insufficient documentation

## 2017-02-05 DIAGNOSIS — H9202 Otalgia, left ear: Secondary | ICD-10-CM | POA: Diagnosis present

## 2017-02-05 MED ORDER — AMOXICILLIN 500 MG PO CAPS
1000.0000 mg | ORAL_CAPSULE | Freq: Once | ORAL | Status: AC
  Administered 2017-02-05: 1000 mg via ORAL
  Filled 2017-02-05: qty 2

## 2017-02-05 MED ORDER — AMOXICILLIN 500 MG PO CAPS: 1000.0000 mg | ORAL_CAPSULE | Freq: Two times a day (BID) | ORAL | 0 refills | Status: DC

## 2017-02-05 NOTE — ED Triage Notes (Addendum)
Pt from home with c/o left ear pain that. Pt denies cough/cold recently. Pt denies fever or chills. Pt denies dizziness. Pt denies changing in hearing. Pt is hypertensive at time of assessment. Pt states he has not had his htn medication today- manual pb of 168/92

## 2017-02-05 NOTE — ED Provider Notes (Signed)
[facility]-EMERGENCY DEPT Provider Note   CSN: [id number] Arrival date & time: 02/05/17  1835   By signing my name below, I, Nelwyn Salisbury, attest that this documentation has been prepared under the direction and in the presence of non-physician practitioner, Wynetta Emery, PA-C. Electronically Signed: Nelwyn Salisbury, Scribe. 02/05/2017. 8:27 PM.  History   Chief Complaint Chief Complaint  Patient presents with  . Otalgia    left    The history is provided by the patient. No language interpreter was used.    HPI Comments:  Henry Chandler is a 36 y.o. male who presents to the Emergency Department complaining of sudden-onset, constant, left-sided ear pain beginning two days ago. He describes his pain as an 8/10 aching pain. Pt reports associated minor cough. He has not tried anything PTA for his symptoms. He denies any hearing loss, congestion, rhinorrhea or fever.   Past Medical History:  Diagnosis Date  . Hypertension     Patient Active Problem List   Diagnosis Date Noted  . 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 Pain of left scapula 01/11/2016  . Urethritis 06/27/2015  . Hyperglycemia 06/13/2015  . Essential hypertension, benign 06/07/2014  . Dyslipidemia 06/07/2014    History reviewed. No pertinent surgical history.     Home Medications    Prior to Admission medications   Medication Sig Start Date End Date Taking? Authorizing Provider  amoxicillin (AMOXIL) 500 MG capsule Take 2 capsules (1,000 mg total) by mouth 2 (two) times daily. 02/05/17   Roxan Yamamoto, PA-C  benzonatate (TESSALON) 200 MG capsule Take 1 capsule (200 mg total) by mouth 3 (three) times daily as needed for cough. 11/20/16   Rodolph Bong, MD  chlorhexidine (PERIDEX) 0.12 % solution Use as directed 15 mLs in the mouth or throat 2 (two) times daily. Patient not taking: Reported on 11/20/2016 05/11/16   Fayrene Helper, PA-C  ibuprofen (ADVIL,MOTRIN) 800 MG tablet Take 800 mg by  mouth every 8 (eight) hours as needed for moderate pain.    Historical Provider, MD  meloxicam (MOBIC) 15 MG tablet Take 1 tablet (15 mg total) by mouth daily. To try and reduce pain. 07/17/16   Laren Boom, DO  methocarbamol (ROBAXIN) 500 MG tablet Take 1 tablet (500 mg total) by mouth every 8 (eight) hours as needed for muscle spasms. 01/11/16   Rodolph Bong, MD  metoprolol succinate (TOPROL-XL) 100 MG 24 hr tablet Take 1 tablet (100 mg total) by mouth daily. Take with or immediately following a meal. 11/20/16   Rodolph Bong, MD  traMADol (ULTRAM) 50 MG tablet Take 1 tablet (50 mg total) by mouth every 8 (eight) hours as needed. 11/20/16   Rodolph Bong, MD  valACYclovir (VALTREX) 1000 MG tablet Take 1 tablet (1,000 mg total) by mouth 2 (two) times daily. Use only if a herpes outbreak occurs. 07/09/16   Laren Boom, DO    Family History Family History  Problem Relation Age of Onset  . Hypertension Mother   . Cancer Mother     pancreas  . Hyperlipidemia Mother   . Hypertension Father   . Hyperlipidemia Father   . Heart disease Sister   . Diabetes Neg Hx   . Stroke Neg Hx     Social History Social History  Substance Use Topics  . Smoking status: Current Every Day Smoker    Packs/day: 0.25    Years: 10.00  . Smokeless tobacco: Never Used  . Alcohol use No  Allergies   Lisinopril   Review of Systems Review of Systems  Constitutional: Negative for fever.  HENT: Positive for ear pain. Negative for congestion, hearing loss and rhinorrhea.   Respiratory: Positive for cough.      Physical Exam Updated Vital Signs BP (!) 162/116 (BP Location: Left Arm)   Pulse 94   Temp 98 F (36.7 C) (Oral)   Resp 16   Ht 6\' 1"  (1.854 m)   Wt (!) 153.3 kg   SpO2 100%   BMI 44.59 kg/m   Physical Exam  Constitutional: He is oriented to person, place, and time. He appears well-developed and well-nourished. No distress.  HENT:  Head: Normocephalic and atraumatic.  Left TM: erythematous  and bulging with no light reflex. Normal outer ear canal  Right TM: normal architecture.   Eyes: Conjunctivae are normal.  Cardiovascular: Normal rate.   Pulmonary/Chest: Effort normal.  Abdominal: He exhibits no distension.  Neurological: He is alert and oriented to person, place, and time.  Skin: Skin is warm and dry.  Psychiatric: He has a normal mood and affect.  Nursing note and vitals reviewed.    ED Treatments / Results  DIAGNOSTIC STUDIES:  Oxygen Saturation is 100% on RA, normal by my interpretation.    COORDINATION OF CARE:  8:30 PM Discussed treatment plan with pt at bedside which includes discharge with abx and pt agreed to plan.  Labs (all labs ordered are listed, but only abnormal results are displayed) Labs Reviewed - No data to display  EKG  EKG Interpretation None       Radiology No results found.  Procedures Procedures (including critical care time)  Medications Ordered in ED Medications  amoxicillin (AMOXIL) capsule 1,000 mg (1,000 mg Oral Given 02/05/17 2037)     Initial Impression / Assessment and Plan / ED Course  I have reviewed the triage vital signs and the nursing notes.  Pertinent labs & imaging results that were available during my care of the patient were reviewed by me and considered in my medical decision making (see chart for details).     Vitals:   02/05/17 1858  BP: (!) 162/116  Pulse: 94  Resp: 16  Temp: 98 F (36.7 C)  TempSrc: Oral  SpO2: 100%  Weight: (!) 153.3 kg  Height: 6\' 1"  (1.854 m)    Medications  amoxicillin (AMOXIL) capsule 1,000 mg (1,000 mg Oral Given 02/05/17 2037)    Henry Chandler is 36 y.o. male presenting with Left ear pain, physical exam is consistent with otitis media. Patient with elevated blood pressure, advised recheck next week.  Evaluation does not show pathology that would require ongoing emergent intervention or inpatient treatment. Pt is hemodynamically stable and mentating  appropriately. Discussed findings and plan with patient/guardian, who agrees with care plan. All questions answered. Return precautions discussed and outpatient follow up given.      Final Clinical Impressions(s) / ED Diagnoses   Final diagnoses:  Acute suppurative otitis media of left ear without spontaneous rupture of tympanic membrane, recurrence not specified  Elevated blood pressure reading    New Prescriptions Discharge Medication List as of 02/05/2017  8:31 PM    START taking these medications   Details  amoxicillin (AMOXIL) 500 MG capsule Take 2 capsules (1,000 mg total) by mouth 2 (two) times daily., Starting Wed 02/05/2017, Print       I personally performed the services described in this documentation, which was scribed in my presence. The recorded information has been reviewed  and is accurate.     Wynetta Emery, PA-C 02/05/17 2108    Rolland Porter, MD 02/17/17 2008

## 2017-02-05 NOTE — ED Notes (Signed)
Patient was alert, oriented and stable upon discharge. RN went over AVS and patient had no further questions.  

## 2017-02-05 NOTE — Discharge Instructions (Signed)
Please follow with your primary care doctor in the next 2 days for a check-up. They must obtain records for further management.   Do not hesitate to return to the Emergency Department for any new, worsening or concerning symptoms.   Please follow with your primary care doctor in the next 5 days for high blood pressure evaluation. If you do not have a primary care doctor, present to urgent care. Reduce salt intake. Seek emergency medical care for unilateral weakness, slurring, change in vision, or chest pain and shortness of breath.

## 2017-02-06 ENCOUNTER — Ambulatory Visit: Payer: PRIVATE HEALTH INSURANCE | Admitting: Physician Assistant

## 2017-02-06 ENCOUNTER — Ambulatory Visit (INDEPENDENT_AMBULATORY_CARE_PROVIDER_SITE_OTHER): Payer: PRIVATE HEALTH INSURANCE | Admitting: Physician Assistant

## 2017-02-06 ENCOUNTER — Encounter: Payer: Self-pay | Admitting: Physician Assistant

## 2017-02-06 VITALS — BP 162/119 | HR 95 | Ht 73.0 in | Wt 331.6 lb

## 2017-02-06 DIAGNOSIS — Z0000 Encounter for general adult medical examination without abnormal findings: Secondary | ICD-10-CM

## 2017-02-06 DIAGNOSIS — E7800 Pure hypercholesterolemia, unspecified: Secondary | ICD-10-CM | POA: Diagnosis not present

## 2017-02-06 DIAGNOSIS — I10 Essential (primary) hypertension: Secondary | ICD-10-CM | POA: Diagnosis not present

## 2017-02-06 DIAGNOSIS — M153 Secondary multiple arthritis: Secondary | ICD-10-CM | POA: Diagnosis not present

## 2017-02-06 DIAGNOSIS — M545 Low back pain: Secondary | ICD-10-CM

## 2017-02-06 DIAGNOSIS — G8929 Other chronic pain: Secondary | ICD-10-CM | POA: Insufficient documentation

## 2017-02-06 MED ORDER — MELOXICAM 15 MG PO TABS: 15.0000 mg | ORAL_TABLET | Freq: Every day | ORAL | 1 refills | Status: DC

## 2017-02-06 MED ORDER — METHOCARBAMOL 500 MG PO TABS: 500.0000 mg | ORAL_TABLET | Freq: Three times a day (TID) | ORAL | 1 refills | Status: DC | PRN

## 2017-02-06 MED ORDER — METOPROLOL SUCCINATE ER 100 MG PO TB24: 100.0000 mg | ORAL_TABLET | Freq: Every day | ORAL | 0 refills | Status: DC

## 2017-02-06 MED ORDER — HYDROCHLOROTHIAZIDE 25 MG PO TABS: 25.0000 mg | ORAL_TABLET | Freq: Every day | ORAL | 0 refills | Status: DC

## 2017-02-06 NOTE — Patient Instructions (Addendum)
Return in 2 weeks for a nurse visit blood pressure check  I have also ordered fasting labs. The lab is a walk-in open M-F 8a-5p (closed 12:30-1:30p). Nothing to eat or drink after midnight or at least 8 hours before your blood draw. You can have water and your medications.   Take Meloxicam once daily. DO NOT TAKE ANY OTHER OTC PAIN RELIEVERS Tylenol is okay  Physical Activity Recommendations for modifying lipids and lowering blood pressure Engage in aerobic physical activity to reduce LDL-cholesterol, non-HDL-cholesterol, and blood pressure  Frequency: 3-4 sessions per week  Intensity: moderate to vigorous  Duration: 40 minutes on average  Physical Activity Recommendations for secondary prevention 1. Aerobic exercise  Frequency: 3-5 sessions per week  Intensity: 50-80% capacity  Duration: 20 - 60 minutes  Examples: walking, treadmill, cycling, rowing, stair climbing, and arm/leg ergometry  2. Resistance exercise  Frequency: 2-3 sessions per week  Intensity: 10-15 repetitions/set to moderate fatigue  Duration: 1-3 sets of 8-10 upper and lower body exercises  Examples: calisthenics, elastic bands, cuff/hand weights, dumbbels, free weights, wall pulleys, and weight machines  Heart-Healthy Lifestyle  Eating a diet rich in vegetables, fruits and whole grains: also includes low-fat dairy products, poultry, fish, legumes, and nuts; limit intake of sweets, sugar-sweetened beverages and red meats  Getting regular exercise  Maintaining a healthy weight  Not smoking or getting help quitting  Staying on top of your health; for some people, lifestyle changes alone may not be enough to prevent a heart attack or stroke. In these cases, taking a statin at the right dose will most likely be necessary   How to Take Your Blood Pressure Blood pressure is a measurement of how strongly your blood is pressing against the walls of your arteries. Arteries are blood vessels that carry blood  from your heart throughout your body. Your health care provider takes your blood pressure at each office visit. You can also take your own blood pressure at home with a blood pressure machine. You may need to take your own blood pressure:  To confirm a diagnosis of high blood pressure (hypertension).  To monitor your blood pressure over time.  To make sure your blood pressure medicine is working. Supplies needed: To take your blood pressure, you will need a blood pressure machine. You can buy a blood pressure machine, or blood pressure monitor, at most drugstores or online. There are several types of home blood pressure monitors. When choosing one, consider the following:  Choose a monitor that has an arm cuff.  Choose a monitor that wraps snugly around your upper arm. You should be able to fit only one finger between your arm and the cuff.  Do not choose a monitor that measures your blood pressure from your wrist or finger. Your health care provider can suggest a reliable monitor that will meet your needs. How to prepare To get the most accurate reading, avoid the following for 30 minutes before you check your blood pressure:  Drinking caffeine.  Drinking alcohol.  Eating.  Smoking.  Exercising. Five minutes before you check your blood pressure:  Empty your bladder.  Sit quietly without talking in a dining chair, rather than in a soft couch or armchair. How to take your blood pressure To check your blood pressure, follow the instructions in the manual that came with your blood pressure monitor. If you have a digital blood pressure monitor, the instructions may be as follows: 1. Sit up straight. 2. Place your feet on the  floor. Do not cross your ankles or legs. 3. Rest your left arm at the level of your heart on a table or desk or on the arm of a chair. 4. Pull up your shirt sleeve. 5. Wrap the blood pressure cuff around the upper part of your left arm, 1 inch (2.5 cm) above  your elbow. It is best to wrap the cuff around bare skin. 6. Fit the cuff snugly around your arm. You should be able to place only one finger between the cuff and your arm. 7. Position the cord inside the groove of your elbow. 8. Press the power button. 9. Sit quietly while the cuff inflates and deflates. 10. Read the digital reading on the monitor screen and write it down (record it). 11. Wait 2-3 minutes, then repeat the steps starting at step 1. What does my blood pressure reading mean? A blood pressure reading is recorded as two numbers, such as "120 over 80" (or 120/80). The first ("top") number is called the systolic pressure. It is a measure of the pressure in your arteries as the heart beats. The second ("bottom") number is called the diastolic pressure. It is a measure of the pressure in your arteries as the heart relaxes between beats. Blood pressure is classified into four stages. The following are the stages for adults who do not have a short-term serious illness or a chronic condition. Systolic pressure and diastolic pressure are measured in a unit called mm Hg. Normal  Systolic pressure: below 120.  Diastolic pressure: below 80. Prehypertension  Systolic pressure: 120-139.  Diastolic pressure: 80-89. Hypertension stage 1  Systolic pressure: 140-159.  Diastolic pressure: 90-99. Hypertension stage 2  Systolic pressure: 160 or above.  Diastolic pressure: 100 or above. You can have prehypertension or hypertension even if only the systolic or only the diastolic number in your reading is higher than normal. Follow these instructions at home:  Check your blood pressure as often as recommended by your health care provider.  Take your monitor to the next appointment with your health care provider to make sure:  That you are using it correctly.  That it provides accurate readings.  Be sure you understand what your goal blood pressure numbers are.  Tell your health care  provider if you are having any side effects from blood pressure medicine. Contact a health care provider if:  Your blood pressure is consistently high. Get help right away if:  Your systolic blood pressure is higher than 180.  Your diastolic blood pressure is higher than 110. This information is not intended to replace advice given to you by your health care provider. Make sure you discuss any questions you have with your health care provider. Document Released: 05/10/2016 Document Revised: 07/23/2016 Document Reviewed: 05/10/2016 Elsevier Interactive Patient Education  2017 [employer].

## 2017-02-06 NOTE — Progress Notes (Signed)
HPI:                                                                Henry Chandler is a 36 y.o. male who presents to [facility]: [facility] today to establish care  HTN: takes Metoprolol. Reports he ran out of his meds and has not taken this for about 10 days. He has an allergy to Lisinopril. Does not checks BP's at home.  Denies vision change, headache, chest pain on exertion, dyspnea, or lightheadedness. He does endorse intermittent peripheral edema. He is a current every day smoker; smokes approx. 1 pack per week  Lab Results  Component Value Date   CHOL 108 (L) 06/07/2016   HDL 33 (L) 06/07/2016   LDLCALC 61 06/07/2016   TRIG 72 06/07/2016   CHOLHDL 3.3 06/07/2016    Past Medical History:  Diagnosis Date  . Hypertension   . Mild hypercholesterolemia 06/07/2014   No past surgical history on file. Social History  Substance Use Topics  . Smoking status: Current Every Day Smoker    Packs/day: 0.25    Years: 10.00  . Smokeless tobacco: Never Used     Comment: 1 pack per week  . Alcohol use 1.2 oz/week    2 Shots of liquor per week   family history includes Cancer in his mother; Heart disease in his sister; Hyperlipidemia in his father and mother; Hypertension in his father and mother.  ROS: negative except as noted in the HPI  Medications: Current Outpatient Prescriptions  Medication Sig Dispense Refill  . methocarbamol (ROBAXIN) 500 MG tablet Take 1 tablet (500 mg total) by mouth every 8 (eight) hours as needed for muscle spasms. 30 tablet 1  . metoprolol succinate (TOPROL-XL) 100 MG 24 hr tablet Take 1 tablet (100 mg total) by mouth daily. Take with or immediately following a meal. 30 tablet 0  . valACYclovir (VALTREX) 1000 MG tablet Take 1 tablet (1,000 mg total) by mouth 2 (two) times daily. Use only if a herpes outbreak occurs. 20 tablet 0  . amoxicillin (AMOXIL) 500 MG capsule Take 2 capsules (1,000 mg total) by mouth 2 (two) times  daily. (Patient not taking: Reported on 02/06/2017) 40 capsule 0  . hydrochlorothiazide (HYDRODIURIL) 25 MG tablet Take 1 tablet (25 mg total) by mouth daily. 30 tablet 0  . meloxicam (MOBIC) 15 MG tablet Take 1 tablet (15 mg total) by mouth daily. 30 tablet 1   No current facility-administered medications for this visit.    Allergies  Allergen Reactions  . Lisinopril Swelling    cough       Objective:  BP (!) 162/119   Pulse 95   Ht 6\' 1"  (1.854 m)   Wt (!) 331 lb 9.6 oz (150.4 kg)   SpO2 97%   BMI 43.75 kg/m  Gen: well-groomed, cooperative, not ill-appearing, no distress HEENT: normal conjunctiva, oropharynx clear, moist mucus membranes, no thyromegaly or tenderness Pulm: Normal work of breathing, normal phonation, clear to auscultation bilaterally, no wheezes, rales or rhonchi CV: Normal rate, regular rhythm, s1 and s2 distinct, no murmurs, clicks or rubs  GI: soft, nondistended, nontender Neuro: alert and oriented x 3, EOM's intact Skin: warm and dry, no rashes or lesions on exposed skin, no  cyanosis   No results found for this or any previous visit (from the past 72 hour(s)). No results found.   Assessment and Plan: 36 y.o. male with    Chronic bilateral low back pain without sciatica, secondary OA - methocarbamol (ROBAXIN) 500 MG tablet; Take 1 tablet (500 mg total) by mouth every 8 (eight) hours as needed for muscle spasms.  Dispense: 30 tablet; Refill: 1 - meloxicam (MOBIC) 15 MG tablet; Take 1 tablet (15 mg total) by mouth daily.  Dispense: 30 tablet; Refill: 1  Uncontrolled hypertension - BP out of range today. Refilled beta blocker and adding thiazide - instructed to check pressures at home and log for the next 2 weeks - hydrochlorothiazide (HYDRODIURIL) 25 MG tablet; Take 1 tablet (25 mg total) by mouth daily.  Dispense: 30 tablet; Refill: 0 - metoprolol succinate (TOPROL-XL) 100 MG 24 hr tablet; Take 1 tablet (100 mg total) by mouth daily. Take with or  immediately following a meal.  Dispense: 30 tablet; Refill: 0  Encounter for preventative adult health care examination - COMPLETE METABOLIC PANEL WITH GFR - Hemoglobin A1c  Mild hypercholesterolemia - DASH eating plan - cardiovascular exercise - smoking cessation - not a candidate for statin therapy   Patient education and anticipatory guidance given Patient agrees with treatment plan Follow-up in 2 weeks for nurse visit BP check or sooner as needed   Levonne Hubert PA-C

## 2017-02-20 ENCOUNTER — Ambulatory Visit: Payer: PRIVATE HEALTH INSURANCE

## 2017-03-08 ENCOUNTER — Encounter (HOSPITAL_COMMUNITY): Payer: Self-pay

## 2017-03-08 ENCOUNTER — Emergency Department (HOSPITAL_COMMUNITY)
Admission: EM | Admit: 2017-03-08 | Discharge: 2017-03-08 | Disposition: A | Discharge status: Home / Self Care | Payer: PRIVATE HEALTH INSURANCE | Attending: Emergency Medicine | Admitting: Emergency Medicine

## 2017-03-08 ENCOUNTER — Emergency Department (HOSPITAL_COMMUNITY): Payer: PRIVATE HEALTH INSURANCE

## 2017-03-08 DIAGNOSIS — K219 Gastro-esophageal reflux disease without esophagitis: Secondary | ICD-10-CM | POA: Diagnosis not present

## 2017-03-08 DIAGNOSIS — I10 Essential (primary) hypertension: Secondary | ICD-10-CM | POA: Diagnosis not present

## 2017-03-08 DIAGNOSIS — J029 Acute pharyngitis, unspecified: Secondary | ICD-10-CM | POA: Diagnosis present

## 2017-03-08 DIAGNOSIS — F17200 Nicotine dependence, unspecified, uncomplicated: Secondary | ICD-10-CM | POA: Insufficient documentation

## 2017-03-08 LAB — RAPID STREP SCREEN (MED CTR MEBANE ONLY): STREPTOCOCCUS, GROUP A SCREEN (DIRECT): NEGATIVE

## 2017-03-08 MED ORDER — IBUPROFEN 800 MG PO TABS
800.0000 mg | ORAL_TABLET | Freq: Once | ORAL | Status: AC
  Administered 2017-03-08: 800 mg via ORAL
  Filled 2017-03-08: qty 1

## 2017-03-08 MED ORDER — GI COCKTAIL ~~LOC~~
30.0000 mL | Freq: Once | ORAL | Status: AC
  Administered 2017-03-08: 30 mL via ORAL
  Filled 2017-03-08: qty 30

## 2017-03-08 MED ORDER — OMEPRAZOLE 20 MG PO CPDR: 20.0000 mg | DELAYED_RELEASE_CAPSULE | Freq: Every day | ORAL | 0 refills | Status: DC

## 2017-03-08 NOTE — ED Provider Notes (Signed)
[facility]-EMERGENCY DEPT Provider Note   CSN: [id number] Arrival date & time: 03/08/17  0325     History   Chief Complaint Chief Complaint  Patient presents with  . Cough  . Sore Throat  . Headache    HPI Henry Chandler is a 36 y.o. male.  The history is provided by the patient.  Cough  This is a new problem. The current episode started more than 1 week ago. The problem occurs every few hours. The problem has not changed since onset.The cough is non-productive. There has been no fever. Pertinent negatives include no chest pain, no ear congestion and no shortness of breath. He has tried nothing for the symptoms. The treatment provided no relief. His past medical history does not include pneumonia.  Sore Throat  This is a new problem. The current episode started more than 1 week ago. The problem occurs constantly. The problem has not changed since onset.Pertinent negatives include no chest pain and no shortness of breath. Nothing aggravates the symptoms. Nothing relieves the symptoms. He has tried nothing for the symptoms. The treatment provided no relief.  Cough since before he was seen for otitis media that did not respond to antibiotics.  Mostly at night when lying flat with burning in throat sometimes has a bitter acidic taste.  No SOB no DOE no leg pain or swelling  Past Medical History:  Diagnosis Date  . Hypertension   . Mild hypercholesterolemia 06/07/2014    Patient Active Problem List   Diagnosis Date Noted  . Chronic bilateral low back pain without sciatica 02/06/2017  . Uncontrolled hypertension 02/06/2017  . 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 Pain of left scapula 01/11/2016  . Urethritis 06/27/2015  . Hyperglycemia 06/13/2015  . Essential hypertension, benign 06/07/2014  . Mild hypercholesterolemia 06/07/2014    History reviewed. No pertinent surgical history.     Home Medications    Prior to Admission medications   Medication  Sig Start Date End Date Taking? Authorizing Provider  amoxicillin (AMOXIL) 500 MG capsule Take 2 capsules (1,000 mg total) by mouth 2 (two) times daily. Patient not taking: Reported on 02/06/2017 02/05/17   Joni Reining Pisciotta, PA-C  hydrochlorothiazide (HYDRODIURIL) 25 MG tablet Take 1 tablet (25 mg total) by mouth daily. 02/06/17   Carlis Stable, PA-C  meloxicam (MOBIC) 15 MG tablet Take 1 tablet (15 mg total) by mouth daily. 02/06/17   Carlis Stable, PA-C  methocarbamol (ROBAXIN) 500 MG tablet Take 1 tablet (500 mg total) by mouth every 8 (eight) hours as needed for muscle spasms. 02/06/17   Carlis Stable, PA-C  metoprolol succinate (TOPROL-XL) 100 MG 24 hr tablet Take 1 tablet (100 mg total) by mouth daily. Take with or immediately following a meal. 02/06/17   Carlis Stable, PA-C  valACYclovir (VALTREX) 1000 MG tablet Take 1 tablet (1,000 mg total) by mouth 2 (two) times daily. Use only if a herpes outbreak occurs. 07/09/16   Laren Boom, DO    Family History Family History  Problem Relation Age of Onset  . Hypertension Mother   . Cancer Mother     pancreas  . Hyperlipidemia Mother   . Hypertension Father   . Hyperlipidemia Father   . Heart disease Sister   . Diabetes Neg Hx   . Stroke Neg Hx     Social History Social History  Substance Use Topics  . Smoking status: Current Every Day Smoker    Packs/day: 0.25  Years: 10.00  . Smokeless tobacco: Never Used     Comment: 1 pack per week  . Alcohol use 1.2 oz/week    2 Shots of liquor per week     Allergies   Lisinopril   Review of Systems Review of Systems  Constitutional: Negative for fever.  HENT: Negative for congestion, sneezing, trouble swallowing and voice change.   Respiratory: Positive for cough. Negative for chest tightness and shortness of breath.   Cardiovascular: Negative for chest pain, palpitations and leg swelling.  All other systems reviewed and are  negative.    Physical Exam Updated Vital Signs BP (!) 161/120 (BP Location: Left Arm)   Pulse 73   Temp 98.2 F (36.8 C) (Oral)   Resp 17   Ht 6\' 1"  (1.854 m)   Wt (!) 330 lb (149.7 kg)   SpO2 100%   BMI 43.54 kg/m   Physical Exam  Constitutional: He is oriented to person, place, and time. He appears well-developed and well-nourished. No distress.  HENT:  Head: Normocephalic and atraumatic.  Nose: Nose normal.  Mouth/Throat: Oropharynx is clear and moist. No oropharyngeal exudate.  Eyes: Conjunctivae and EOM are normal. Pupils are equal, round, and reactive to light.  Neck: Normal range of motion. Neck supple. No JVD present.  Cardiovascular: Normal rate, regular rhythm and intact distal pulses.   Pulmonary/Chest: Effort normal and breath sounds normal. No stridor. No respiratory distress. He has no wheezes. He has no rales. He exhibits no tenderness.  Abdominal: Soft. He exhibits no mass. Bowel sounds are increased. There is no tenderness. There is no rebound and no guarding.  Musculoskeletal: Normal range of motion. He exhibits no tenderness or deformity.  Neurological: He is alert and oriented to person, place, and time.  Skin: Skin is warm and dry. Capillary refill takes less than 2 seconds.  Psychiatric: He has a normal mood and affect.     ED Treatments / Results   Vitals:   03/08/17 0339  BP: (!) 161/120  Pulse: 73  Resp: 17  Temp: 98.2 F (36.8 C)     Results for orders placed or performed during the hospital encounter of 03/08/17  Rapid strep screen  Result Value Ref Range   Streptococcus, Group A Screen (Direct) NEGATIVE NEGATIVE   No results found.  Procedures Procedures (including critical care time)  Medications Ordered in ED Medications  ibuprofen (ADVIL,MOTRIN) tablet 800 mg (800 mg Oral Given 03/08/17 0428)  gi cocktail (Maalox,Lidocaine,Donnatal) (30 mLs Oral Given 03/08/17 0428)      Final Clinical Impressions(s) / ED Diagnoses  I  suspect this is all GERD as he eats late and goes right to bed and this happens when lying flat.  PERC negative, wells 0 highly doubt PE in this very low risk patient.  I do not believe there is any indication for antibiotics and will treat for gerd.  Based on history and exam patient has been appropriately medically screened and emergency conditions excluded. Patient is stable for discharge at this time. Strict return precautions given for fever, Shortness of breath, leg pain or swelling shortness of breath, chest pain, intractable wheezing, INABILITY TO SWALLOW OR MAKE SPEECH, weakness, numbness, lethargy, intractable vomiting, lethargy, worsening symptomsor anyfurther problems or concerns. Follow up with your PMD in 2 days for recheck.  I personally performed the services described in this documentation, which was scribed in my presence. The recorded information has been reviewed and is accurate.     New Prescriptions New Prescriptions  No medications on file     Minami Arriaga, MD 03/08/17 [phone]

## 2017-03-08 NOTE — ED Triage Notes (Signed)
Pt. Has been coughing for about a month. Produces green sputum when he coughs. Cannot control coughing once it starts. Headache also has accompanied this cough. Sore throat started this week. Pt. Has tried OTC meds for this but no relief. Just getting over an ear infection

## 2017-03-10 LAB — CULTURE, GROUP A STREP (THRC)

## 2017-03-20 ENCOUNTER — Encounter (HOSPITAL_COMMUNITY): Payer: Self-pay | Admitting: Emergency Medicine

## 2017-03-20 ENCOUNTER — Emergency Department (HOSPITAL_COMMUNITY)
Admission: EM | Admit: 2017-03-20 | Discharge: 2017-03-20 | Disposition: A | Discharge status: Home / Self Care | Payer: PRIVATE HEALTH INSURANCE | Attending: Emergency Medicine | Admitting: Emergency Medicine

## 2017-03-20 DIAGNOSIS — K0889 Other specified disorders of teeth and supporting structures: Secondary | ICD-10-CM | POA: Diagnosis present

## 2017-03-20 DIAGNOSIS — K029 Dental caries, unspecified: Secondary | ICD-10-CM | POA: Diagnosis not present

## 2017-03-20 DIAGNOSIS — I10 Essential (primary) hypertension: Secondary | ICD-10-CM | POA: Insufficient documentation

## 2017-03-20 DIAGNOSIS — F17200 Nicotine dependence, unspecified, uncomplicated: Secondary | ICD-10-CM | POA: Insufficient documentation

## 2017-03-20 MED ORDER — NAPROXEN 500 MG PO TABS: 500.0000 mg | ORAL_TABLET | Freq: Two times a day (BID) | ORAL | 0 refills | Status: DC

## 2017-03-20 MED ORDER — PENICILLIN V POTASSIUM 500 MG PO TABS: 500.0000 mg | ORAL_TABLET | Freq: Three times a day (TID) | ORAL | 0 refills | Status: DC

## 2017-03-20 NOTE — Discharge Instructions (Signed)
Take anti inflammatory medication and antibiotic for your dental pain.  Follow up with your dentist for further care.

## 2017-03-20 NOTE — ED Provider Notes (Signed)
[facility]-EMERGENCY DEPT Provider Note    By signing my name below, I, Earmon Phoenix, attest that this documentation has been prepared under the direction and in the presence of Fayrene Helper, PA-C. Electronically Signed: Earmon Phoenix, ED Scribe. 03/20/17. 5:48 PM.    History   Chief Complaint Chief Complaint  Patient presents with  . Dental Pain   The history is provided by the patient and medical records. No language interpreter was used.    Henry Chandler is an obese 36 y.o. male with PMHx of HTN, HLD and chronic back pain who presents to the Emergency Department complaining of throbbing upper left dental pain that began two weeks ago. He reports associated lower left dental pain, left ear and eye pain. He has been taking Ibuprofen and applying warm compresses for pain with minimal relief. Temperature change while eating/drinking increase the pain. He denies alleviating factors. He denies any trauma or injury of the mouth. He denies fever, chills, drooling, tinnitus, difficulty swallowing or breathing. He reports allergy to Lisinopril with a reaction of throat swelling. He has an appt with a dentist next week.   Past Medical History:  Diagnosis Date  . Hypertension   . Mild hypercholesterolemia 06/07/2014    Patient Active Problem List   Diagnosis Date Noted  . Chronic bilateral low back pain without sciatica 02/06/2017  . Uncontrolled hypertension 02/06/2017  . 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 Pain of left scapula 01/11/2016  . Urethritis 06/27/2015  . Hyperglycemia 06/13/2015  . Essential hypertension, benign 06/07/2014  . Mild hypercholesterolemia 06/07/2014    History reviewed. No pertinent surgical history.     Home Medications    Prior to Admission medications   Medication Sig Start Date End Date Taking? Authorizing Provider  amoxicillin (AMOXIL) 500 MG capsule Take 2 capsules (1,000 mg total) by mouth 2 (two) times  daily. Patient not taking: Reported on 02/06/2017 02/05/17   Joni Reining Pisciotta, PA-C  hydrochlorothiazide (HYDRODIURIL) 25 MG tablet Take 1 tablet (25 mg total) by mouth daily. 02/06/17   Carlis Stable, PA-C  meloxicam (MOBIC) 15 MG tablet Take 1 tablet (15 mg total) by mouth daily. 02/06/17   Carlis Stable, PA-C  methocarbamol (ROBAXIN) 500 MG tablet Take 1 tablet (500 mg total) by mouth every 8 (eight) hours as needed for muscle spasms. 02/06/17   Carlis Stable, PA-C  metoprolol succinate (TOPROL-XL) 100 MG 24 hr tablet Take 1 tablet (100 mg total) by mouth daily. Take with or immediately following a meal. 02/06/17   Carlis Stable, PA-C  omeprazole (PRILOSEC) 20 MG capsule Take 1 capsule (20 mg total) by mouth daily. 03/08/17   April Palumbo, MD  valACYclovir (VALTREX) 1000 MG tablet Take 1 tablet (1,000 mg total) by mouth 2 (two) times daily. Use only if a herpes outbreak occurs. 07/09/16   Laren Boom, DO    Family History Family History  Problem Relation Age of Onset  . Hypertension Mother   . Cancer Mother     pancreas  . Hyperlipidemia Mother   . Hypertension Father   . Hyperlipidemia Father   . Heart disease Sister   . Diabetes Neg Hx   . Stroke Neg Hx     Social History Social History  Substance Use Topics  . Smoking status: Current Every Day Smoker    Packs/day: 0.25    Years: 10.00  . Smokeless tobacco: Never Used     Comment: 1 pack per week  . Alcohol  use 1.2 oz/week    2 Shots of liquor per week     Allergies   Lisinopril   Review of Systems Review of Systems  Constitutional: Negative for chills and fever.  HENT: Positive for dental problem. Negative for drooling, tinnitus and trouble swallowing.   Respiratory: Negative for shortness of breath.      Physical Exam Updated Vital Signs BP (!) 154/100 (BP Location: Left Arm)   Pulse 71   Temp 98.4 F (36.9 C) (Oral)   Resp 16   Ht 6\' 1"  (1.854 m)   Wt (!)  330 lb (149.7 kg)   SpO2 98%   BMI 43.54 kg/m   Physical Exam  Constitutional: He is oriented to person, place, and time. He appears well-developed and well-nourished.  HENT:  Head: Normocephalic and atraumatic.  Mouth/Throat: Uvula is midline, oropharynx is clear and moist and mucous membranes are normal. No trismus in the jaw. Abnormal dentition. Dental caries present. No dental abscesses. No tonsillar exudate.  Dental decay noted to tooth # 17 with Tenderness to palpation. No gingival swelling or abscess. No signs of mastoiditis. No TMJ.  Eyes: Conjunctivae and EOM are normal. Pupils are equal, round, and reactive to light.  Neck: Normal range of motion.  Cardiovascular: Normal rate.   Pulmonary/Chest: Effort normal.  Musculoskeletal: Normal range of motion.  Lymphadenopathy:    He has no cervical adenopathy.  Neurological: He is alert and oriented to person, place, and time.  Skin: Skin is warm and dry.  Psychiatric: He has a normal mood and affect. His behavior is normal.  Nursing note and vitals reviewed.    ED Treatments / Results  DIAGNOSTIC STUDIES: Oxygen Saturation is 98% on RA, normal by my interpretation.   COORDINATION OF CARE: 5:47 PM- Will prescribe antibiotic and encouraged pt to keep dental appt. Pt verbalizes understanding and agrees to plan.  Medications - No data to display  Labs (all labs ordered are listed, but only abnormal results are displayed) Labs Reviewed - No data to display  EKG  EKG Interpretation None       Radiology No results found.  Procedures Procedures (including critical care time)  Medications Ordered in ED Medications - No data to display   Initial Impression / Assessment and Plan / ED Course  I have reviewed the triage vital signs and the nursing notes.  Pertinent labs & imaging results that were available during my care of the patient were reviewed by me and considered in my medical decision making (see chart for  details).     BP (!) 154/100 (BP Location: Left Arm)   Pulse 71   Temp 98.4 F (36.9 C) (Oral)   Resp 16   Ht 6\' 1"  (1.854 m)   Wt (!) 149.7 kg   SpO2 98%   BMI 43.54 kg/m  The patient was noted to be hypertensive today in the emergency department. I have spoken with the patient regarding hypertension and the need for improved management. I instructed the patient to followup with the Primary care doctor within 4 days to improve the management of the patient's hypertension. I also counseled the patient regarding the signs and symptoms which would require an emergent visit to an emergency department for hypertensive urgency and/or hypertensive emergency. The patient understood the need for improved hypertensive management.   Final Clinical Impressions(s) / ED Diagnoses   Final diagnoses:  Pain due to dental caries    New Prescriptions New Prescriptions   NAPROXEN (NAPROSYN) 500  MG TABLET    Take 1 tablet (500 mg total) by mouth 2 (two) times daily.   PENICILLIN V POTASSIUM (VEETID) 500 MG TABLET    Take 1 tablet (500 mg total) by mouth 3 (three) times daily.    I personally performed the services described in this documentation, which was scribed in my presence. The recorded information has been reviewed and is accurate.        Fayrene Helper, PA-C 03/20/17 1752    Doug Sou, MD 03/20/17 [phone]

## 2017-03-20 NOTE — ED Triage Notes (Signed)
Pt c/o of left sided dental pain. Has appt for extraction next Friday.

## 2017-04-01 ENCOUNTER — Other Ambulatory Visit: Payer: Self-pay | Admitting: Physician Assistant

## 2017-04-01 DIAGNOSIS — I10 Essential (primary) hypertension: Secondary | ICD-10-CM

## 2017-04-30 ENCOUNTER — Ambulatory Visit (INDEPENDENT_AMBULATORY_CARE_PROVIDER_SITE_OTHER): Payer: PRIVATE HEALTH INSURANCE | Admitting: Physician Assistant

## 2017-04-30 ENCOUNTER — Ambulatory Visit (INDEPENDENT_AMBULATORY_CARE_PROVIDER_SITE_OTHER): Payer: PRIVATE HEALTH INSURANCE

## 2017-04-30 ENCOUNTER — Encounter: Payer: Self-pay | Admitting: Physician Assistant

## 2017-04-30 VITALS — BP 155/109 | HR 91 | Temp 98.4°F | Resp 14 | Wt 344.0 lb

## 2017-04-30 DIAGNOSIS — Z720 Tobacco use: Secondary | ICD-10-CM | POA: Insufficient documentation

## 2017-04-30 DIAGNOSIS — F17200 Nicotine dependence, unspecified, uncomplicated: Secondary | ICD-10-CM | POA: Diagnosis not present

## 2017-04-30 DIAGNOSIS — R05 Cough: Secondary | ICD-10-CM

## 2017-04-30 DIAGNOSIS — M5136 Other intervertebral disc degeneration, lumbar region: Secondary | ICD-10-CM

## 2017-04-30 DIAGNOSIS — R109 Unspecified abdominal pain: Secondary | ICD-10-CM | POA: Diagnosis not present

## 2017-04-30 DIAGNOSIS — I10 Essential (primary) hypertension: Secondary | ICD-10-CM | POA: Diagnosis not present

## 2017-04-30 DIAGNOSIS — M160 Bilateral primary osteoarthritis of hip: Secondary | ICD-10-CM | POA: Diagnosis not present

## 2017-04-30 DIAGNOSIS — R3129 Other microscopic hematuria: Secondary | ICD-10-CM

## 2017-04-30 DIAGNOSIS — R0602 Shortness of breath: Secondary | ICD-10-CM

## 2017-04-30 DIAGNOSIS — M12852 Other specific arthropathies, not elsewhere classified, left hip: Secondary | ICD-10-CM | POA: Diagnosis not present

## 2017-04-30 DIAGNOSIS — M12851 Other specific arthropathies, not elsewhere classified, right hip: Secondary | ICD-10-CM | POA: Diagnosis not present

## 2017-04-30 DIAGNOSIS — M5137 Other intervertebral disc degeneration, lumbosacral region: Secondary | ICD-10-CM | POA: Insufficient documentation

## 2017-04-30 DIAGNOSIS — M51379 Other intervertebral disc degeneration, lumbosacral region without mention of lumbar back pain or lower extremity pain: Secondary | ICD-10-CM

## 2017-04-30 DIAGNOSIS — R0600 Dyspnea, unspecified: Secondary | ICD-10-CM | POA: Insufficient documentation

## 2017-04-30 DIAGNOSIS — R10A3 Flank pain, bilateral: Secondary | ICD-10-CM

## 2017-04-30 LAB — COMPREHENSIVE METABOLIC PANEL
ALT: 24 U/L (ref 9–46)
AST: 22 U/L (ref 10–40)
Albumin: 4.2 g/dL (ref 3.6–5.1)
Alkaline Phosphatase: 84 U/L (ref 40–115)
BUN: 14 mg/dL (ref 7–25)
CHLORIDE: 106 mmol/L (ref 98–110)
CO2: 20 mmol/L (ref 20–31)
Calcium: 9.3 mg/dL (ref 8.6–10.3)
Creat: 1.08 mg/dL (ref 0.60–1.35)
GLUCOSE: 99 mg/dL (ref 65–99)
POTASSIUM: 3.9 mmol/L (ref 3.5–5.3)
Sodium: 139 mmol/L (ref 135–146)
Total Bilirubin: 0.4 mg/dL (ref 0.2–1.2)
Total Protein: 7.2 g/dL (ref 6.1–8.1)

## 2017-04-30 LAB — CBC WITH DIFFERENTIAL/PLATELET
BASOS ABS: 0 {cells}/uL (ref 0–200)
Basophils Relative: 0 %
EOS ABS: 79 {cells}/uL (ref 15–500)
EOS PCT: 1 %
HCT: 43.3 % (ref 38.5–50.0)
Hemoglobin: 14.7 g/dL (ref 13.2–17.1)
LYMPHS PCT: 32 %
Lymphs Abs: 2528 cells/uL (ref 850–3900)
MCH: 28.5 pg (ref 27.0–33.0)
MCHC: 33.9 g/dL (ref 32.0–36.0)
MCV: 84.1 fL (ref 80.0–100.0)
MONOS PCT: 8 %
MPV: 9 fL (ref 7.5–12.5)
Monocytes Absolute: 632 cells/uL (ref 200–950)
NEUTROS ABS: 4661 {cells}/uL (ref 1500–7800)
NEUTROS PCT: 59 %
PLATELETS: 286 10*3/uL (ref 140–400)
RBC: 5.15 MIL/uL (ref 4.20–5.80)
RDW: 15.7 % — ABNORMAL HIGH (ref 11.0–15.0)
WBC: 7.9 10*3/uL (ref 3.8–10.8)

## 2017-04-30 LAB — POCT URINALYSIS DIPSTICK
BILIRUBIN UA: NEGATIVE
Glucose, UA: NEGATIVE
KETONES UA: NEGATIVE
LEUKOCYTES UA: NEGATIVE
Nitrite, UA: NEGATIVE
PROTEIN UA: NEGATIVE
Spec Grav, UA: 1.02 (ref 1.010–1.025)
Urobilinogen, UA: 0.2 E.U./dL
pH, UA: 6.5 (ref 5.0–8.0)

## 2017-04-30 MED ORDER — PREDNISONE 50 MG PO TABS: ORAL_TABLET | ORAL | 0 refills | Status: DC

## 2017-04-30 NOTE — Progress Notes (Signed)
HPI:                                                                Henry Chandler is a 36 y.o. male who presents to [facility]: Primary Care Sports Medicine today for shortness of breath  Patient with PMH HTN, chronic back pain, and tobacco use reports sudden onset shortness of breath and bilateral flank pain radiating to his mid-back beginning 2 days ago. Patient states he feels like he can't take a deep breath. He endorses an intermittent, non-productive cough. He is a current smoker. He denies doing anything strenuous. He denies fever, chills, abdominal pain, nausea. He denies hematuria or history of kidney stones.   Past Medical History:  Diagnosis Date  . Back pain   . Hypertension   . Obesity    No past surgical history on file. Social History  Substance Use Topics  . Smoking status: Current Every Day Smoker    Packs/day: 0.25    Years: 10.00  . Smokeless tobacco: Never Used     Comment: 1 pack per week  . Alcohol use 1.2 oz/week    2 Shots of liquor per week   family history includes Cancer in his mother; Heart disease in his sister; Hyperlipidemia in his father and mother; Hypertension in his father and mother.  ROS: negative except as noted in the HPI  Medications: Current Outpatient Prescriptions  Medication Sig Dispense Refill  . hydrochlorothiazide (HYDRODIURIL) 25 MG tablet Take 1 tablet (25 mg total) by mouth daily. 30 tablet 0  . meloxicam (MOBIC) 15 MG tablet Take 1 tablet (15 mg total) by mouth daily. 30 tablet 1  . metoprolol succinate (TOPROL-XL) 100 MG 24 hr tablet Take 1 tablet (100 mg total) by mouth daily. With meals.  DUE FOR FOLLOW UP APPOINTMENT 30 tablet 0  . omeprazole (PRILOSEC) 20 MG capsule Take 1 capsule (20 mg total) by mouth daily. 30 capsule 0  . valACYclovir (VALTREX) 1000 MG tablet Take 1 tablet (1,000 mg total) by mouth 2 (two) times daily. Use only if a herpes outbreak occurs. 20 tablet 0  . predniSONE (DELTASONE) 50 MG  tablet One tab PO daily for 5 days. 5 tablet 0   No current facility-administered medications for this visit.    Allergies  Allergen Reactions  . Lisinopril Swelling    cough     Objective:  BP (!) 155/109   Pulse 91   Temp 98.4 F (36.9 C) (Oral)   Wt (!) 344 lb (156 kg)   SpO2 96%   BMI 45.39 kg/m  Gen: well-groomed, morbidly obese, cooperative, not ill-appearing, no distress HEENT: normal conjunctiva, oropharynx clear, moist mucus membranes, neck supple, trachea midline Pulm: Normal work of breathing, normal phonation, clear to auscultation bilaterally, no wheezes, rales or rhonchi CV: Normal rate, regular rhythm, s1 and s2 distinct, no murmurs, clicks or rubs  GI: abdomen soft, nondistended, tender in the RUQ and right flank, no rebound, no guarding, negative Murphy's sign Neuro: alert and oriented x 3, EOM's intact, no tremor MSK: moving all extremities, normal gait and station, no peripheral edema Lymph: no cervical or tonsillar adenopathy Skin: warm, dry, intact; no rashes or lesions on exposed skin, no cyanosis   Results for orders placed or  performed in visit on 04/30/17 (from the past 72 hour(s))  POCT Urinalysis Dipstick     Status: Abnormal   Collection Time: 04/30/17  9:07 AM  Result Value Ref Range   Color, UA yellow    Clarity, UA clear    Glucose, UA negative    Bilirubin, UA negative    Ketones, UA negative    Spec Grav, UA 1.020 1.010 - 1.025   Blood, UA trace    pH, UA 6.5 5.0 - 8.0   Protein, UA negative    Urobilinogen, UA 0.2 0.2 or 1.0 E.U./dL   Nitrite, UA negative    Leukocytes, UA Negative Negative   Dg Chest 2 View  Result Date: 04/30/2017 CLINICAL DATA:  Cough, congestion, and shortness of breath for 2 weeks. Smoker. EXAM: CHEST  2 VIEW COMPARISON:  03/08/2017 FINDINGS: The cardiomediastinal silhouette is within normal limits. There is new mild peribronchial thickening. No confluent airspace opacity, edema, pleural effusion, or  pneumothorax is identified. No acute osseous abnormality is seen. IMPRESSION: Mild bronchitic changes. Electronically Signed   By: Sebastian Ache M.D.   On: 04/30/2017 08:44   Ct Renal Stone Study  Result Date: 04/30/2017 CLINICAL DATA:  Right flank pain for 2 days. Obesity. Hypertension. Microscopic hematuria. EXAM: CT ABDOMEN AND PELVIS WITHOUT CONTRAST TECHNIQUE: Multidetector CT imaging of the abdomen and pelvis was performed following the standard protocol without IV contrast. COMPARISON:  07/17/2016 FINDINGS: Lower chest: Unremarkable Hepatobiliary: Mildly contracted gallbladder. Otherwise unremarkable. Pancreas: Unremarkable Spleen: Unremarkable Adrenals/Urinary Tract: Unremarkable ; no urinary tract calculus, hydronephrosis, or hydroureter observed. Stomach/Bowel: Unremarkable.  Appendix normal. Vascular/Lymphatic: Small retroperitoneal and pelvic lymph nodes are not pathologically enlarged. Reproductive: Unremarkable Other: No supplemental non-categorized findings. 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 Disc bulge with mild intervertebral spurring and possible central disc protrusion at L5-S1, resulting in borderline bilateral foraminal stenosis. Mild degenerative arthropathy of both hips, with mild but advanced for age sclerosis and subcortical cyst formation anteriorly in both acetabula, right greater than left. IMPRESSION: 1. No urinary tract calculus is currently visible. No hydronephrosis or hydroureter. Cause for right flank pain not well seen. 2. Appendix normal. 3. 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 In some circumstances this can cause Bertolotti syndrome. 4. Degenerative disc disease at L5-S1 resulting in borderline bilateral foraminal stenosis. 5. Mild but advanced for age degenerative arthropathy of the hips, with sclerosis  and mild subcortical cyst formation anteriorly along the acetabula. Electronically Signed   By: Gaylyn Rong M.D.   On: 04/30/2017 10:04   ECG 04/30/17 8:55am Vent Rate 74 bpm PR-I 156 ms QRS 94 ms QTc 459 ms Normal sinus rhythm  Assessment and Plan: 36 y.o. male with   1. Shortness of breath - I personally reviewed patient's CXR, which was negative for infiltrate or acute cardiopulmonary pathology - I personally reviewed patient's EKG 12-Lead showing NSR, normal PR-I, normal axis, no ST changes - patient is hypertensive today, but vital signs are otherwise stable. SpO2 96% on RA, no respiratory distress. Suspect SOB is subjective and secondary to patient not taking deep breaths due to pain - D-dimer, quantitative  - CBC with Differential/Platelet - Comprehensive metabolic panel - recommend Spirometry in 2 weeks  2. Tobacco use - encouraged patient to cut back on cigarettes and consider smoking cessation - Spirometry in 2 weeks to assess for any COPD  3.  Uncontrolled hypertension - patient BP out of range today. Patient denies vision change, headache, chest pain, orthopnea, or lightheadedness. He reports he did not take his medication today - Take your Hydrochlorothiazide every morning and your Metoprolol twice a day - Limit salt. Follow DASH eating plan - Follow-up in 2 weeks  4. Bilateral flank pain - POCT Urinalysis Dipstick significant for trace blood - CT RENAL STONE STUDY - CBC with Differential/Platelet - Comprehensive metabolic panel  5. Microscopic hematuria - CT RENAL STONE STUDY   Patient education and anticipatory guidance given Patient agrees with treatment plan Follow-up as needed if symptoms worsen or fail to improve  I spent 40 minutes with this patient, greater than 50% was face-to-face time counseling regarding the above diagnoses   Levonne Hubert PA-C

## 2017-04-30 NOTE — Patient Instructions (Addendum)
For your flank/abdominal pain - We are going to scan your abdomen to check for a kidney stone - I also want you to go to the lab for some blood work - We will call you this afternoon with your results and I will send any medications needed to the pharmacy  For your blood pressure: - Take your Hydrochlorothiazide every morning and your Metoprolol twice a day - Check blood pressure at home at least once a day - Check around the same time each day in a relaxed setting (rest for at least 2 minutes, legs uncrossed, arm resting on a table at level with your heart) - Limit salt. Follow DASH eating plan - Follow-up in 2 weeks  For your shortness of breath - Limit cigarettes and any products you are inhaling in your lungs - Consider smoking cessation - alternatives include nicotine gum, nicotine patch and vaping 1-800-QUIT-NOW - Return in 2 weeks for Spirometry (lung function testing)   Kidney Stones Kidney stones (urolithiasis) are solid, rock-like deposits that form inside of the organs that make urine (kidneys). A kidney stone may form in a kidney and move into the bladder, where it can cause intense pain and block the flow of urine. Kidney stones are created when high levels of certain minerals are found in the urine. They are usually passed through urination, but in some cases, medical treatment may be needed to remove them. What are the causes? Kidney stones may be caused by:  A condition in which certain glands produce too much parathyroid hormone (primary hyperparathyroidism), which causes too much calcium buildup in the blood.  Buildup of uric acid crystals in the bladder (hyperuricosuria). Uric acid is a chemical that the body produces when you eat certain foods. It usually exits the body in the urine.  Narrowing (stricture) of one or both of the tubes that drain urine from the kidneys to the bladder (ureters).  A kidney blockage that is present at birth (congenital  obstruction).  Past surgery on the kidney or the ureters, such as gastric bypass surgery. What increases the risk? The following factors make you more likely to develop kidney stones:  Having had a kidney stone in the past.  Having a family history of kidney stones.  Not drinking enough water.  Eating a diet that is high in protein, salt (sodium), or sugar.  Being overweight or obese. What are the signs or symptoms? Symptoms of a kidney stone may include:  Nausea.  Vomiting.  Blood in the urine (hematuria).  Pain in the side of the abdomen, right below the ribs (flank pain). Pain usually spreads (radiates) to the groin.  Needing to urinate frequently or urgently. How is this diagnosed? This condition may be diagnosed based on:  Your medical history.  A physical exam.  Blood tests.  Urine tests.  CT scan.  Abdominal X-ray.  A procedure to examine the inside of the bladder (cystoscopy). How is this treated? Treatment for kidney stones depends on the size, location, and makeup of the stones. Treatment may involve:  Analyzing your urine before and after you pass the stone through urination.  Being monitored at the hospital until you pass the stone through urination.  Increasing your fluid intake and decreasing the amount of calcium and protein in your diet.  A procedure to break up kidney stones in the bladder using:  A focused beam of light (laser therapy).  Shock waves (extracorporeal shock wave lithotripsy).  Surgery to remove kidney stones. This may  be needed if you have severe pain or have stones that block your urinary tract. Follow these instructions at home: Eating and drinking    Drink enough fluid to keep your urine clear or pale yellow. This will help you to pass the kidney stone.  If directed, change your diet. This may include:  Limiting how much sodium you eat.  Eating more fruits and vegetables.  Limiting how much meat, poultry, fish,  and eggs you eat.  Follow instructions from your health care provider about eating or drinking restrictions. General instructions   Collect urine samples as told by your health care provider. You may need to collect a urine sample:  24 hours after you pass the stone.  8-12 weeks after passing the kidney stone, and every 6-12 months after that.  Strain your urine every time you urinate, for as long as directed. Use the strainer that your health care provider recommends.  Do not throw out the kidney stone after passing it. Keep the stone so it can be tested by your health care provider. Testing the makeup of your kidney stone may help prevent you from getting kidney stones in the future.  Take over-the-counter and prescription medicines only as told by your health care provider.  Keep all follow-up visits as told by your health care provider. This is important. You may need follow-up X-rays or ultrasounds to make sure that your stone has passed. How is this prevented? To prevent another kidney stone:  Drink enough fluid to keep your urine clear or pale yellow. This is the best way to prevent kidney stones.  Eat a healthy diet and follow recommendations from your health care provider about foods to avoid. You may be instructed to eat a low-protein diet. Recommendations vary depending on the type of kidney stone that you have.  Maintain a healthy weight. Contact a health care provider if:  You have pain that gets worse or does not get better with medicine. Get help right away if:  You have a fever or chills.  You develop severe pain.  You develop new abdominal pain.  You faint.  You are unable to urinate. This information is not intended to replace advice given to you by your health care provider. Make sure you discuss any questions you have with your health care provider. Document Released: 12/02/2005 Document Revised: 06/21/2016 Document Reviewed: 05/17/2016 Elsevier  Interactive Patient Education  2017 [employer].

## 2017-05-01 LAB — D-DIMER, QUANTITATIVE (NOT AT ARMC): D DIMER QUANT: 0.41 ug{FEU}/mL (ref <0.50)

## 2017-05-04 NOTE — Progress Notes (Signed)
Patient's CT stone study negative for nephrolithiasis. Patient found to have significant degenerative changes in his lower spine and bilateral hips, which would explain his bilateral flank pain today.   Degenerative disc disease at L5-S1 level - predniSONE (DELTASONE) 50 MG tablet; One tab PO daily for 5 days.  Dispense: 5 tablet; Refill: 0 - recommend close follow-up with Sports Medicine in 1 week  Bilateral hip joint arthritis -  Plan as above

## 2017-05-14 ENCOUNTER — Ambulatory Visit: Payer: PRIVATE HEALTH INSURANCE | Admitting: Physician Assistant

## 2017-05-27 LAB — HEMOGLOBIN A1C: HEMOGLOBIN A1C: 5.3

## 2017-05-27 LAB — BASIC METABOLIC PANEL: Glucose: 127

## 2017-05-27 LAB — LIPID PANEL
CHOLESTEROL: 100 (ref 0–200)
HDL: 34 — AB (ref 35–70)
LDL Cholesterol: 42
Triglycerides: 118 (ref 40–160)

## 2017-06-13 ENCOUNTER — Encounter: Payer: Self-pay | Admitting: Physician Assistant

## 2017-06-13 ENCOUNTER — Ambulatory Visit (INDEPENDENT_AMBULATORY_CARE_PROVIDER_SITE_OTHER): Payer: PRIVATE HEALTH INSURANCE | Admitting: Physician Assistant

## 2017-06-13 VITALS — BP 149/114 | HR 75 | Ht 71.75 in | Wt 332.0 lb

## 2017-06-13 DIAGNOSIS — Z0000 Encounter for general adult medical examination without abnormal findings: Secondary | ICD-10-CM

## 2017-06-13 DIAGNOSIS — R0683 Snoring: Secondary | ICD-10-CM | POA: Insufficient documentation

## 2017-06-13 DIAGNOSIS — Z131 Encounter for screening for diabetes mellitus: Secondary | ICD-10-CM

## 2017-06-13 DIAGNOSIS — Z6841 Body Mass Index (BMI) 40.0 and over, adult: Secondary | ICD-10-CM

## 2017-06-13 DIAGNOSIS — G4489 Other headache syndrome: Secondary | ICD-10-CM

## 2017-06-13 DIAGNOSIS — Z13220 Encounter for screening for lipoid disorders: Secondary | ICD-10-CM

## 2017-06-13 DIAGNOSIS — I10 Essential (primary) hypertension: Secondary | ICD-10-CM

## 2017-06-13 DIAGNOSIS — Z9189 Other specified personal risk factors, not elsewhere classified: Secondary | ICD-10-CM

## 2017-06-13 MED ORDER — HYDROCHLOROTHIAZIDE 25 MG PO TABS: 25.0000 mg | ORAL_TABLET | Freq: Every day | ORAL | 0 refills | Status: DC

## 2017-06-13 MED ORDER — METOPROLOL SUCCINATE ER 100 MG PO TB24: 100.0000 mg | ORAL_TABLET | Freq: Every day | ORAL | 0 refills | Status: DC

## 2017-06-13 NOTE — Patient Instructions (Addendum)
I have ordered fasting labs. The lab is a walk-in open M-F 7:30a-4:30p (closed 12:30-1:30p). Nothing to eat or drink after midnight or at least 8 hours before your blood draw. You can have water and your medications.    For blood pressure - Take your Metoprolol and Hydrochlorothiazide every morning - Limit salt. Follow DASH eating plan - Follow-up in 2 weeks  For headaches - Tylenol 1000mg  every 8 hours as needed - Return to office when you have a headache, so we can check your blood pressure  Physical Activity Recommendations for modifying lipids and lowering blood pressure Engage in aerobic physical activity to reduce LDL-cholesterol, non-HDL-cholesterol, and blood pressure  Frequency: 3-4 sessions per week  Intensity: moderate to vigorous  Duration: 40 minutes on average  Physical Activity Recommendations for secondary prevention 1. Aerobic exercise  Frequency: 3-5 sessions per week  Intensity: 50-80% capacity  Duration: 20 - 60 minutes  Examples: walking, treadmill, cycling, rowing, stair climbing, and arm/leg ergometry  2. Resistance exercise  Frequency: 2-3 sessions per week  Intensity: 10-15 repetitions/set to moderate fatigue  Duration: 1-3 sets of 8-10 upper and lower body exercises  Examples: calisthenics, elastic bands, cuff/hand weights, dumbbels, free weights, wall pulleys, and weight machines  Heart-Healthy Lifestyle  Eating a diet rich in vegetables, fruits and whole grains: also includes low-fat dairy products, poultry, fish, legumes, and nuts; limit intake of sweets, sugar-sweetened beverages and red meats  Getting regular exercise  Maintaining a healthy weight  Not smoking or getting help quitting  Staying on top of your health; for some people, lifestyle changes alone may not be enough to prevent a heart attack or stroke. In these cases, taking a statin at the right dose will most likely be necessary   Managing Your Hypertension Hypertension  is commonly called high blood pressure. This is when the force of your blood pressing against the walls of your arteries is too strong. Arteries are blood vessels that carry blood from your heart throughout your body. Hypertension forces the heart to work harder to pump blood, and may cause the arteries to become narrow or stiff. Having untreated or uncontrolled hypertension can cause heart attack, stroke, kidney disease, and other problems. What are blood pressure readings? A blood pressure reading consists of a higher number over a lower number. Ideally, your blood pressure should be below 120/80. The first ("top") number is called the systolic pressure. It is a measure of the pressure in your arteries as your heart beats. The second ("bottom") number is called the diastolic pressure. It is a measure of the pressure in your arteries as the heart relaxes. What does my blood pressure reading mean? Blood pressure is classified into four stages. Based on your blood pressure reading, your health care provider may use the following stages to determine what type of treatment you need, if any. Systolic pressure and diastolic pressure are measured in a unit called mm Hg. Normal  Systolic pressure: below 120.  Diastolic pressure: below 80. Elevated  Systolic pressure: 120-129.  Diastolic pressure: below 80. Hypertension stage 1  Systolic pressure: 130-139.  Diastolic pressure: 80-89. Hypertension stage 2  Systolic pressure: 140 or above.  Diastolic pressure: 90 or above. What health risks are associated with hypertension? Managing your hypertension is an important responsibility. Uncontrolled hypertension can lead to:  A heart attack.  A stroke.  A weakened blood vessel (aneurysm).  Heart failure.  Kidney damage.  Eye damage.  Metabolic syndrome.  Memory and concentration problems.  What  changes can I make to manage my hypertension? Hypertension can be managed by making lifestyle  changes and possibly by taking medicines. Your health care provider will help you make a plan to bring your blood pressure within a normal range. Eating and drinking  Eat a diet that is high in fiber and potassium, and low in salt (sodium), added sugar, and fat. An example eating plan is called the DASH (Dietary Approaches to Stop Hypertension) diet. To eat this way: ? Eat plenty of fresh fruits and vegetables. Try to fill half of your plate at each meal with fruits and vegetables. ? Eat whole grains, such as whole wheat pasta, brown rice, or whole grain bread. Fill about one quarter of your plate with whole grains. ? Eat low-fat diary products. ? Avoid fatty cuts of meat, processed or cured meats, and poultry with skin. Fill about one quarter of your plate with lean proteins such as fish, chicken without skin, beans, eggs, and tofu. ? Avoid premade and processed foods. These tend to be higher in sodium, added sugar, and fat.  Reduce your daily sodium intake. Most people with hypertension should eat less than 1,500 mg of sodium a day.  Limit alcohol intake to no more than 1 drink a day for nonpregnant women and 2 drinks a day for men. One drink equals 12 oz of beer, 5 oz of wine, or 1 oz of hard liquor. Lifestyle  Work with your health care provider to maintain a healthy body weight, or to lose weight. Ask what an ideal weight is for you.  Get at least 30 minutes of exercise that causes your heart to beat faster (aerobic exercise) most days of the week. Activities may include walking, swimming, or biking.  Include exercise to strengthen your muscles (resistance exercise), such as weight lifting, as part of your weekly exercise routine. Try to do these types of exercises for 30 minutes at least 3 days a week.  Do not use any products that contain nicotine or tobacco, such as cigarettes and e-cigarettes. If you need help quitting, ask your health care provider.  Control any long-term (chronic)  conditions you have, such as high cholesterol or diabetes. Monitoring  Monitor your blood pressure at home as told by your health care provider. Your personal target blood pressure may vary depending on your medical conditions, your age, and other factors.  Have your blood pressure checked regularly, as often as told by your health care provider. Working with your health care provider  Review all the medicines you take with your health care provider because there may be side effects or interactions.  Talk with your health care provider about your diet, exercise habits, and other lifestyle factors that may be contributing to hypertension.  Visit your health care provider regularly. Your health care provider can help you create and adjust your plan for managing hypertension. Will I need medicine to control my blood pressure? Your health care provider may prescribe medicine if lifestyle changes are not enough to get your blood pressure under control, and if:  Your systolic blood pressure is 130 or higher.  Your diastolic blood pressure is 80 or higher.  Take medicines only as told by your health care provider. Follow the directions carefully. Blood pressure medicines must be taken as prescribed. The medicine does not work as well when you skip doses. Skipping doses also puts you at risk for problems. Contact a health care provider if:  You think you are having a reaction  to medicines you have taken.  You have repeated (recurrent) headaches.  You feel dizzy.  You have swelling in your ankles.  You have trouble with your vision. Get help right away if:  You develop a severe headache or confusion.  You have unusual weakness or numbness, or you feel faint.  You have severe pain in your chest or abdomen.  You vomit repeatedly.  You have trouble breathing. Summary  Hypertension is when the force of blood pumping through your arteries is too strong. If this condition is not  controlled, it may put you at risk for serious complications.  Your personal target blood pressure may vary depending on your medical conditions, your age, and other factors. For most people, a normal blood pressure is less than 120/80.  Hypertension is managed by lifestyle changes, medicines, or both. Lifestyle changes include weight loss, eating a healthy, low-sodium diet, exercising more, and limiting alcohol. This information is not intended to replace advice given to you by your health care provider. Make sure you discuss any questions you have with your health care provider. Document Released: 08/26/2012 Document Revised: 10/30/2016 Document Reviewed: 10/30/2016 Elsevier Interactive Patient Education  [employer].

## 2017-06-13 NOTE — Progress Notes (Signed)
HPI:                                                                Henry Chandler is a 36 y.o. male who presents to [facility] today for annual physical  Current Concerns include headaches  HTN: not compliant with medications. Does not check BP's at home. Endorses headache and blurred vision. Patient also reports snoring and excessive daytime sleepiness. Denies chest pain with exertion, orthopnea, lightheadedness, syncope and edema. Risk factors include: tobacco use  Reports right sided severe headaches over the last week. States headache is mostly behind his right eye and stops him in his tracks. Reports right eye pain and watering during the episodes. Episodes last minutes.   Health Maintenance Health Maintenance  Topic Date Due  . INFLUENZA VACCINE  08/06/2017 (Originally 07/16/2017)  . TETANUS/TDAP  06/07/2024  . HIV Screening  Completed    Past Medical History:  Diagnosis Date  . Back pain   . Hypertension   . Obesity    History reviewed. No pertinent surgical history. Social History  Substance Use Topics  . Smoking status: Current Every Day Smoker    Packs/day: 0.25    Years: 10.00  . Smokeless tobacco: Never Used     Comment: 1 pack per week  . Alcohol use 1.2 oz/week    2 Shots of liquor per week   family history includes Cancer in his mother; Heart disease in his sister; Hyperlipidemia in his father and mother; Hypertension in his father and mother.  ROS: negative except as noted in the HPI  Medications: Current Outpatient Prescriptions  Medication Sig Dispense Refill  . hydrochlorothiazide (HYDRODIURIL) 25 MG tablet Take 1 tablet (25 mg total) by mouth daily. 90 tablet 0  . meloxicam (MOBIC) 15 MG tablet Take 1 tablet (15 mg total) by mouth daily. 30 tablet 1  . metoprolol succinate (TOPROL-XL) 100 MG 24 hr tablet Take 1 tablet (100 mg total) by mouth daily. With meals. 90 tablet 0  . omeprazole (PRILOSEC) 20 MG  capsule Take 1 capsule (20 mg total) by mouth daily. 30 capsule 0  . valACYclovir (VALTREX) 1000 MG tablet Take 1 tablet (1,000 mg total) by mouth 2 (two) times daily. Use only if a herpes outbreak occurs. 20 tablet 0   No current facility-administered medications for this visit.    Allergies  Allergen Reactions  . Lisinopril Swelling    cough       Objective:  BP (!) 149/114 (BP Location: Right Arm, Patient Position: Sitting, Cuff Size: Large)   Pulse 75   Ht 5' 11.75" (1.822 m)   Wt (!) 332 lb (150.6 kg)   BMI 45.34 kg/m  Gen: well-groomed, cooperative, not ill-appearing, no distress HEENT: normal conjunctiva, TM's clear, oropharynx clear, moist mucus membranes, no thyromegaly or tenderness, neck supple, no cervical lymphadenopathy Pulm: Normal work of breathing, normal phonation, clear to auscultation bilaterally CV: Normal rate, regular rhythm, s1 and s2 distinct, no murmurs, clicks or rubs, no carotid bruit GI: abdomen obese, soft, nondistended, nontender Neuro:  cranial nerves II-XII intact, no nystagmus, normal finger-to-nose, normal heel-to-shin, negative pronator drift, normal coordination, DTR's intact, normal tone, no tremor MSK: strength 5/5 and symmetric in bilateral upper and lower extremities,  normal gait and station Mental Status: alert and oriented x 3, normal speech, cooperative, organized thought content Skin: warm and dry, multiple skin tags on posterior neck/upper back  Psych: normal affect, euthymic mood, normal speech and thought content    No results found for this or any previous visit (from the past 72 hour(s)). No results found.  Depression screen [facility] 2/9 06/13/2017 11/25/2012  Decreased Interest 1 0  Down, Depressed, Hopeless 1 0  PHQ - 2 Score 2 0  Altered sleeping 1 -  Tired, decreased energy 1 -  Change in appetite 1 -  Feeling bad or failure about yourself  0 -  Trouble concentrating 0 -  Moving slowly or fidgety/restless 0 -  Suicidal  thoughts 0 -  PHQ-9 Score 5 -     Assessment and Plan: 36 y.o. male with   1. Annual physical exam  2. Uncontrolled hypertension - metoprolol succinate (TOPROL-XL) 100 MG 24 hr tablet; Take 1 tablet (100 mg total) by mouth daily. With meals.  Dispense: 90 tablet; Refill: 0 - hydrochlorothiazide (HYDRODIURIL) 25 MG tablet; Take 1 tablet (25 mg total) by mouth daily.  Dispense: 90 tablet; Refill: 0 - limit salt. DASH eating plan - follow-up in 2 weeks for nurse visit BP check  3. Class 3 severe obesity due to excess calories with serious comorbidity and body mass index (BMI) of 40.0 to 44.9 in adult (HCC) - Hemoglobin A1c  4. Encounter for screening for lipid disorder - Lipid Panel w/reflex Direct LDL  5. Encounter for screening for diabetes mellitus - Hemoglobin A1c  6. Other headache syndrome - reassuring neurologic exam. No focal deficits or symptoms - unclear etiology. Has cluster headache features - Tylenol 1000mg  every 8 hours as needed - follow-up in 1 month or sooner  7. At risk for obstructive sleep apnea + STOPBANG - Home sleep test; Future  8. Snoring - Home sleep test; Future   Patient education and anticipatory guidance given Patient agrees with treatment plan Follow-up in 2 weeks for nurse visit BP check or sooner as needed  Levonne Hubert PA-C

## 2017-06-25 ENCOUNTER — Encounter: Payer: Self-pay | Admitting: Physician Assistant

## 2017-06-27 ENCOUNTER — Ambulatory Visit: Payer: PRIVATE HEALTH INSURANCE | Admitting: Physician Assistant

## 2017-06-27 DIAGNOSIS — Z0189 Encounter for other specified special examinations: Secondary | ICD-10-CM

## 2017-08-08 ENCOUNTER — Emergency Department (HOSPITAL_COMMUNITY): Payer: PRIVATE HEALTH INSURANCE

## 2017-08-08 ENCOUNTER — Emergency Department (HOSPITAL_COMMUNITY)
Admission: EM | Admit: 2017-08-08 | Discharge: 2017-08-08 | Disposition: A | Discharge status: Home / Self Care | Payer: PRIVATE HEALTH INSURANCE | Attending: Emergency Medicine | Admitting: Emergency Medicine

## 2017-08-08 ENCOUNTER — Encounter (HOSPITAL_COMMUNITY): Payer: Self-pay | Admitting: Emergency Medicine

## 2017-08-08 DIAGNOSIS — Z79899 Other long term (current) drug therapy: Secondary | ICD-10-CM | POA: Diagnosis not present

## 2017-08-08 DIAGNOSIS — F17200 Nicotine dependence, unspecified, uncomplicated: Secondary | ICD-10-CM | POA: Diagnosis not present

## 2017-08-08 DIAGNOSIS — G44209 Tension-type headache, unspecified, not intractable: Secondary | ICD-10-CM | POA: Diagnosis not present

## 2017-08-08 DIAGNOSIS — I10 Essential (primary) hypertension: Secondary | ICD-10-CM | POA: Diagnosis not present

## 2017-08-08 DIAGNOSIS — M62838 Other muscle spasm: Secondary | ICD-10-CM | POA: Insufficient documentation

## 2017-08-08 DIAGNOSIS — R51 Headache: Secondary | ICD-10-CM | POA: Diagnosis present

## 2017-08-08 LAB — URINALYSIS, ROUTINE W REFLEX MICROSCOPIC
Bilirubin Urine: NEGATIVE
Glucose, UA: NEGATIVE mg/dL
Ketones, ur: NEGATIVE mg/dL
Leukocytes, UA: NEGATIVE
Nitrite: NEGATIVE
Protein, ur: NEGATIVE mg/dL
Specific Gravity, Urine: 1.015 (ref 1.005–1.030)
Squamous Epithelial / LPF: NONE SEEN
pH: 6.5 (ref 5.0–8.0)

## 2017-08-08 LAB — CBC WITH DIFFERENTIAL/PLATELET
Basophils Absolute: 0 10*3/uL (ref 0.0–0.1)
Basophils Relative: 0 %
Eosinophils Absolute: 0.1 10*3/uL (ref 0.0–0.7)
Eosinophils Relative: 1 %
HCT: 43.2 % (ref 39.0–52.0)
Hemoglobin: 15 g/dL (ref 13.0–17.0)
Lymphocytes Relative: 30 %
Lymphs Abs: 2.4 10*3/uL (ref 0.7–4.0)
MCH: 28.6 pg (ref 26.0–34.0)
MCHC: 34.7 g/dL (ref 30.0–36.0)
MCV: 82.3 fL (ref 78.0–100.0)
Monocytes Absolute: 0.8 10*3/uL (ref 0.1–1.0)
Monocytes Relative: 9 %
Neutro Abs: 4.8 10*3/uL (ref 1.7–7.7)
Neutrophils Relative %: 60 %
Platelets: 253 10*3/uL (ref 150–400)
RBC: 5.25 MIL/uL (ref 4.22–5.81)
RDW: 13.4 % (ref 11.5–15.5)
WBC: 8.1 10*3/uL (ref 4.0–10.5)

## 2017-08-08 LAB — BASIC METABOLIC PANEL
Anion gap: 7 (ref 5–15)
BUN: 15 mg/dL (ref 6–20)
CO2: 28 mmol/L (ref 22–32)
Calcium: 9.2 mg/dL (ref 8.9–10.3)
Chloride: 103 mmol/L (ref 101–111)
Creatinine, Ser: 1.06 mg/dL (ref 0.61–1.24)
GFR calc Af Amer: >60 mL/min (ref >60)
GFR calc non Af Amer: >60 mL/min (ref >60)
Glucose, Bld: 107 mg/dL — ABNORMAL HIGH (ref 65–99)
Potassium: 3.4 mmol/L — ABNORMAL LOW (ref 3.5–5.1)
Sodium: 138 mmol/L (ref 135–145)

## 2017-08-08 LAB — SEDIMENTATION RATE: Sed Rate: 0 mm/hr (ref 0–16)

## 2017-08-08 MED ORDER — SODIUM CHLORIDE 0.9 % IV BOLUS (SEPSIS)
1000.0000 mL | Freq: Once | INTRAVENOUS | Status: AC
  Administered 2017-08-08: 1000 mL via INTRAVENOUS

## 2017-08-08 MED ORDER — PROCHLORPERAZINE EDISYLATE 5 MG/ML IJ SOLN
10.0000 mg | Freq: Once | INTRAMUSCULAR | Status: AC
  Administered 2017-08-08: 10 mg via INTRAVENOUS
  Filled 2017-08-08: qty 2

## 2017-08-08 MED ORDER — TRAMADOL HCL 50 MG PO TABS: 50.0000 mg | ORAL_TABLET | Freq: Four times a day (QID) | ORAL | 0 refills | Status: DC | PRN

## 2017-08-08 MED ORDER — IOPAMIDOL (ISOVUE-370) INJECTION 76%
100.0000 mL | Freq: Once | INTRAVENOUS | Status: AC | PRN
  Administered 2017-08-08: 100 mL via INTRAVENOUS

## 2017-08-08 MED ORDER — HYDROMORPHONE HCL 1 MG/ML IJ SOLN
1.0000 mg | Freq: Once | INTRAMUSCULAR | Status: AC
  Administered 2017-08-08: 1 mg via INTRAVENOUS
  Filled 2017-08-08: qty 1

## 2017-08-08 MED ORDER — ONDANSETRON HCL 4 MG/2ML IJ SOLN
4.0000 mg | Freq: Once | INTRAMUSCULAR | Status: AC
  Administered 2017-08-08: 4 mg via INTRAVENOUS
  Filled 2017-08-08: qty 2

## 2017-08-08 MED ORDER — KETOROLAC TROMETHAMINE 30 MG/ML IJ SOLN
30.0000 mg | Freq: Once | INTRAMUSCULAR | Status: AC
  Administered 2017-08-08: 30 mg via INTRAVENOUS
  Filled 2017-08-08: qty 1

## 2017-08-08 MED ORDER — DIPHENHYDRAMINE HCL 50 MG/ML IJ SOLN
25.0000 mg | Freq: Once | INTRAMUSCULAR | Status: AC
  Administered 2017-08-08: 25 mg via INTRAVENOUS
  Filled 2017-08-08: qty 1

## 2017-08-08 MED ORDER — PREDNISONE 50 MG PO TABS: 50.0000 mg | ORAL_TABLET | Freq: Every day | ORAL | 0 refills | Status: DC

## 2017-08-08 MED ORDER — IOPAMIDOL (ISOVUE-370) INJECTION 76%
INTRAVENOUS | Status: AC
  Filled 2017-08-08: qty 100

## 2017-08-08 MED ORDER — CYCLOBENZAPRINE HCL 10 MG PO TABS: 10.0000 mg | ORAL_TABLET | Freq: Three times a day (TID) | ORAL | 0 refills | Status: DC | PRN

## 2017-08-08 MED ORDER — MORPHINE SULFATE (PF) 2 MG/ML IV SOLN
4.0000 mg | Freq: Once | INTRAVENOUS | Status: AC
  Administered 2017-08-08: 4 mg via INTRAVENOUS
  Filled 2017-08-08: qty 2

## 2017-08-08 NOTE — Discharge Instructions (Signed)
Return here as needed.  Follow-up with your primary care doctor °

## 2017-08-08 NOTE — ED Notes (Signed)
Pt ambulatory and independent at discharge.  Verbalized understanding of discharge instructions 

## 2017-08-08 NOTE — ED Provider Notes (Signed)
[facility]-EMERGENCY DEPT Provider Note   CSN: [id number] Arrival date & time: 08/08/17  0800     History   Chief Complaint Chief Complaint  Patient presents with  . Neck Pain    HPI Henry Chandler is a 36 y.o. male.  HPI Patient presents to the emergency department with neck discomfort that radiates to the right side of his head.  The patient states that this started 2 days ago and got worse last night.  Patient states he has had some nausea but no vomiting. The patient states that he does not get headaches like this normally.  The patient states that he does have a history of hypertension, questionable prediabetes.  Patient states that lights make the headache worse along with certain head movements and palpation of the neck.  Patient states that he did not take any medications prior to arrival.  States that nothing seemed the condition better. The patient denies chest pain, shortness of breath, blurred vision, fever, cough, weakness, numbness, dizziness, anorexia, edema, abdominal pain, nausea, vomiting, diarrhea, rash, back pain, dysuria, hematemesis, bloody stool, near syncope, or syncope. Past Medical History:  Diagnosis Date  . Back pain   . Hypertension   . Obesity     Patient Active Problem List   Diagnosis Date Noted  . Class 3 severe obesity due to excess calories with serious comorbidity and body mass index (BMI) of 40.0 to 44.9 in adult (HCC) 06/13/2017  . Other headache syndrome 06/13/2017  . At risk for obstructive sleep apnea 06/13/2017  . Snoring 06/13/2017  . Tobacco use 04/30/2017  . Shortness of breath 04/30/2017  . Bilateral hip joint arthritis 04/30/2017  . Degenerative disc disease at L5-S1 level 04/30/2017  . Microscopic hematuria 04/30/2017  . Chronic bilateral low back pain without sciatica 02/06/2017  . Uncontrolled hypertension 02/06/2017  . 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 Pain of left scapula 01/11/2016  . Urethritis  06/27/2015  . Hyperglycemia 06/13/2015  . Essential hypertension, benign 06/07/2014    History reviewed. No pertinent surgical history.     Home Medications    Prior to Admission medications   Medication Sig Start Date End Date Taking? Authorizing Provider  hydrochlorothiazide (HYDRODIURIL) 25 MG tablet Take 1 tablet (25 mg total) by mouth daily. 06/13/17  Yes Carlis Stable, PA-C  ibuprofen (ADVIL,MOTRIN) 200 MG tablet Take 400 mg by mouth every 6 (six) hours as needed for fever, headache, mild pain, moderate pain or cramping.   Yes [provider]  meloxicam (MOBIC) 15 MG tablet Take 1 tablet (15 mg total) by mouth daily. Patient taking differently: Take 15 mg by mouth daily as needed for pain.  02/06/17  Yes Carlis Stable, PA-C  metoprolol succinate (TOPROL-XL) 100 MG 24 hr tablet Take 1 tablet (100 mg total) by mouth daily. With meals. 06/13/17  Yes Carlis Stable, PA-C  valACYclovir (VALTREX) 1000 MG tablet Take 1 tablet (1,000 mg total) by mouth 2 (two) times daily. Use only if a herpes outbreak occurs. 07/09/16  Yes Hommel, Sean, DO  omeprazole (PRILOSEC) 20 MG capsule Take 1 capsule (20 mg total) by mouth daily. Patient not taking: Reported on 08/08/2017 03/08/17   Cy Blamer, MD    Family History Family History  Problem Relation Age of Onset  . Hypertension Mother   . Cancer Mother        pancreas  . Hyperlipidemia Mother   . Hypertension Father   . Hyperlipidemia Father   .  Heart disease Sister   . Diabetes Neg Hx   . Stroke Neg Hx     Social History Social History  Substance Use Topics  . Smoking status: Current Every Day Smoker    Packs/day: 0.25    Years: 10.00  . Smokeless tobacco: Never Used     Comment: 1 pack per week  . Alcohol use 1.2 oz/week    2 Shots of liquor per week     Allergies   Lisinopril   Review of Systems Review of Systems All other systems negative except as documented in the HPI.  All pertinent positives and negatives as reviewed in the HPI.  Physical Exam Updated Vital Signs BP (!) 162/120   Pulse (!) 37   Temp 98.2 F (36.8 C) (Oral)   Resp 18   Ht 6\' 1"  (1.854 m)   Wt (!) 156.5 kg (345 lb)   SpO2 (!) 83%   BMI 45.52 kg/m   Physical Exam  Constitutional: He is oriented to person, place, and time. He appears well-developed and well-nourished. No distress.  HENT:  Head: Normocephalic and atraumatic.  Mouth/Throat: Oropharynx is clear and moist.  Eyes: Pupils are equal, round, and reactive to light.  Neck: Normal range of motion. Neck supple. Muscular tenderness present. No spinous process tenderness present. No neck rigidity. Normal range of motion present.    Cardiovascular: Normal rate, regular rhythm and normal heart sounds.  Exam reveals no gallop and no friction rub.   No murmur heard. Pulmonary/Chest: Effort normal and breath sounds normal. No respiratory distress. He has no wheezes.  Abdominal: Soft. Bowel sounds are normal. He exhibits no distension. There is no tenderness.  Neurological: He is alert and oriented to person, place, and time. He exhibits normal muscle tone. Coordination normal.  Skin: Skin is warm and dry. Capillary refill takes less than 2 seconds. No rash noted. No erythema.  Psychiatric: He has a normal mood and affect. His behavior is normal.  Nursing note and vitals reviewed.    ED Treatments / Results  Labs (all labs ordered are listed, but only abnormal results are displayed) Labs Reviewed  BASIC METABOLIC PANEL - Abnormal; Notable for the following:       Result Value   Potassium 3.4 (*)    Glucose, Bld 107 (*)    All other components within normal limits  URINALYSIS, ROUTINE W REFLEX MICROSCOPIC - Abnormal; Notable for the following:    Hgb urine dipstick TRACE (*)    Bacteria, UA RARE (*)    All other components within normal limits  CBC WITH DIFFERENTIAL/PLATELET  SEDIMENTATION RATE    EKG  EKG  Interpretation None       Radiology Ct Angio Head W Or Wo Contrast  Result Date: 08/08/2017 CLINICAL DATA:  Right-sided neck pain beginning last night radiating to the head. Severe headache. EXAM: CT ANGIOGRAPHY HEAD AND NECK TECHNIQUE: Multidetector CT imaging of the head and neck was performed using the standard protocol during bolus administration of intravenous contrast. Multiplanar CT image reconstructions and MIPs were obtained to evaluate the vascular anatomy. Carotid stenosis measurements (when applicable) are obtained utilizing NASCET criteria, using the distal internal carotid diameter as the denominator. CONTRAST:  100 cc Isovue 370 COMPARISON:  10/24/2015 FINDINGS: CT HEAD FINDINGS Brain: No evidence of malformation, atrophy, old or acute small or large vessel infarction, mass lesion, hemorrhage, hydrocephalus or extra-axial collection. No evidence of pituitary lesion. Vascular: No vascular calcification.  No hyperdense vessels. Skull: Normal.  No  fracture or focal bone lesion. Sinuses/Orbits: Visualized sinuses are clear. No fluid in the middle ears or mastoids. Visualized orbits are normal. Other: None significant CTA NECK FINDINGS Aortic arch: Normal. No atherosclerotic change, aneurysm or dissection. Right carotid system: Somewhat low opacity study. Common carotid artery is normal. Carotid bifurcation is normal. Cervical ICA is tortuous but otherwise normal. Left carotid system: Common carotid artery widely patent to the bifurcation. Low opacity exam. Carotid bifurcation appears normal. Cervical ICA tortuous but otherwise normal. Vertebral arteries: Proximal vertebral arteries not well seen because of low contrast opacity and shoulder density. Higher in the neck, both vertebral arteries show flow with patency to the foramen magnum level. Skeleton: Normal Other neck: No mass or lymphadenopathy. Upper chest: Normal Review of the MIP images confirms the above findings CTA HEAD FINDINGS Anterior  circulation: Both internal carotid arteries are patent through the siphon regions. No sign of siphon atherosclerosis. Anterior and middle cerebral vessels are normal without proximal stenosis, aneurysm or vascular malformation. Posterior circulation: Both vertebral arteries are patent to the basilar with the left being dominant. No basilar stenosis. Posterior circulation branch vessels appear normal. Venous sinuses: Normal Anatomic variants: None significant Delayed phase: No abnormal enhancement Review of the MIP images confirms the above findings IMPRESSION: Negative CT angiography of the neck vessels. Contrast opacity is fairly low due to bolus timing. There is no evidence of carotid circulation dissection or atherosclerotic disease. Proximal vertebral arteries are poorly seen because of technical limitation, but beginning at the level of the mid neck, the posterior circulation appears normal. Normal appearance of the brain itself. No abnormality seen to explain headache. Electronically Signed   By: Paulina Fusi M.D.   On: 08/08/2017 11:23   Ct Angio Neck W And/or Wo Contrast  Result Date: 08/08/2017 CLINICAL DATA:  Right-sided neck pain beginning last night radiating to the head. Severe headache. EXAM: CT ANGIOGRAPHY HEAD AND NECK TECHNIQUE: Multidetector CT imaging of the head and neck was performed using the standard protocol during bolus administration of intravenous contrast. Multiplanar CT image reconstructions and MIPs were obtained to evaluate the vascular anatomy. Carotid stenosis measurements (when applicable) are obtained utilizing NASCET criteria, using the distal internal carotid diameter as the denominator. CONTRAST:  100 cc Isovue 370 COMPARISON:  10/24/2015 FINDINGS: CT HEAD FINDINGS Brain: No evidence of malformation, atrophy, old or acute small or large vessel infarction, mass lesion, hemorrhage, hydrocephalus or extra-axial collection. No evidence of pituitary lesion. Vascular: No vascular  calcification.  No hyperdense vessels. Skull: Normal.  No fracture or focal bone lesion. Sinuses/Orbits: Visualized sinuses are clear. No fluid in the middle ears or mastoids. Visualized orbits are normal. Other: None significant CTA NECK FINDINGS Aortic arch: Normal. No atherosclerotic change, aneurysm or dissection. Right carotid system: Somewhat low opacity study. Common carotid artery is normal. Carotid bifurcation is normal. Cervical ICA is tortuous but otherwise normal. Left carotid system: Common carotid artery widely patent to the bifurcation. Low opacity exam. Carotid bifurcation appears normal. Cervical ICA tortuous but otherwise normal. Vertebral arteries: Proximal vertebral arteries not well seen because of low contrast opacity and shoulder density. Higher in the neck, both vertebral arteries show flow with patency to the foramen magnum level. Skeleton: Normal Other neck: No mass or lymphadenopathy. Upper chest: Normal Review of the MIP images confirms the above findings CTA HEAD FINDINGS Anterior circulation: Both internal carotid arteries are patent through the siphon regions. No sign of siphon atherosclerosis. Anterior and middle cerebral vessels are normal without proximal stenosis, aneurysm  or vascular malformation. Posterior circulation: Both vertebral arteries are patent to the basilar with the left being dominant. No basilar stenosis. Posterior circulation branch vessels appear normal. Venous sinuses: Normal Anatomic variants: None significant Delayed phase: No abnormal enhancement Review of the MIP images confirms the above findings IMPRESSION: Negative CT angiography of the neck vessels. Contrast opacity is fairly low due to bolus timing. There is no evidence of carotid circulation dissection or atherosclerotic disease. Proximal vertebral arteries are poorly seen because of technical limitation, but beginning at the level of the mid neck, the posterior circulation appears normal. Normal  appearance of the brain itself. No abnormality seen to explain headache. Electronically Signed   By: Paulina Fusi M.D.   On: 08/08/2017 11:23    Procedures Procedures (including critical care time)  Medications Ordered in ED Medications  iopamidol (ISOVUE-370) 76 % injection (not administered)  sodium chloride 0.9 % bolus 1,000 mL (0 mLs Intravenous Stopped 08/08/17 1100)  morphine 2 MG/ML injection 4 mg (4 mg Intravenous Given 08/08/17 0944)  ondansetron (ZOFRAN) injection 4 mg (4 mg Intravenous Given 08/08/17 0944)  iopamidol (ISOVUE-370) 76 % injection 100 mL (100 mLs Intravenous Contrast Given 08/08/17 1051)  ketorolac (TORADOL) 30 MG/ML injection 30 mg (30 mg Intravenous Given 08/08/17 1227)  diphenhydrAMINE (BENADRYL) injection 25 mg (25 mg Intravenous Given 08/08/17 1227)  prochlorperazine (COMPAZINE) injection 10 mg (10 mg Intravenous Given 08/08/17 1227)  HYDROmorphone (DILAUDID) injection 1 mg (1 mg Intravenous Given 08/08/17 1403)     Initial Impression / Assessment and Plan / ED Course  I have reviewed the triage vital signs and the nursing notes.  Pertinent labs & imaging results that were available during my care of the patient were reviewed by me and considered in my medical decision making (see chart for details).     Patient be treated for strain of his neck and headache.  The patient feels much better following medications and the IV along with IV fluids.  Patient is advised follow-up with his primary care Dr. told to return here as needed.  There was concern for a vascular issue.  Due to the fact that he does have hypertension but does not seem well controlled.  The patient has resolution of his symptoms.  This could have been a migraine/tension type headache.   Final Clinical Impressions(s) / ED Diagnoses   Final diagnoses:  Muscle spasms of neck  Acute non intractable tension-type headache    New Prescriptions New Prescriptions   No medications on file      Charlestine Night, Cordelia Poche 08/08/17 [address], Cristal Deer, PA-C 08/10/17 1610    Lorre Nick, MD 08/15/17 1415

## 2017-08-08 NOTE — ED Triage Notes (Signed)
Patient c/o right sided neck pain that started last night when trying to go to sleep.  Patient reports that pain will radiate up neck to head. Patient reports pain doesn't get any worse with moving head/neck.

## 2018-01-01 ENCOUNTER — Other Ambulatory Visit: Payer: Self-pay | Admitting: Physician Assistant

## 2018-01-01 DIAGNOSIS — I10 Essential (primary) hypertension: Secondary | ICD-10-CM

## 2018-02-06 ENCOUNTER — Emergency Department (HOSPITAL_COMMUNITY): Payer: Self-pay

## 2018-02-06 ENCOUNTER — Emergency Department (HOSPITAL_COMMUNITY)
Admission: EM | Admit: 2018-02-06 | Discharge: 2018-02-06 | Disposition: A | Discharge status: Home / Self Care | Payer: Self-pay | Attending: Emergency Medicine | Admitting: Emergency Medicine

## 2018-02-06 ENCOUNTER — Other Ambulatory Visit: Payer: Self-pay

## 2018-02-06 DIAGNOSIS — Z79899 Other long term (current) drug therapy: Secondary | ICD-10-CM | POA: Insufficient documentation

## 2018-02-06 DIAGNOSIS — J111 Influenza due to unidentified influenza virus with other respiratory manifestations: Secondary | ICD-10-CM | POA: Insufficient documentation

## 2018-02-06 DIAGNOSIS — R69 Illness, unspecified: Secondary | ICD-10-CM

## 2018-02-06 DIAGNOSIS — I10 Essential (primary) hypertension: Secondary | ICD-10-CM | POA: Insufficient documentation

## 2018-02-06 DIAGNOSIS — B9789 Other viral agents as the cause of diseases classified elsewhere: Secondary | ICD-10-CM | POA: Insufficient documentation

## 2018-02-06 DIAGNOSIS — J069 Acute upper respiratory infection, unspecified: Secondary | ICD-10-CM

## 2018-02-06 DIAGNOSIS — F17200 Nicotine dependence, unspecified, uncomplicated: Secondary | ICD-10-CM | POA: Insufficient documentation

## 2018-02-06 LAB — RAPID STREP SCREEN (MED CTR MEBANE ONLY): Streptococcus, Group A Screen (Direct): NEGATIVE

## 2018-02-06 LAB — INFLUENZA PANEL BY PCR (TYPE A & B)
Influenza A By PCR: NEGATIVE
Influenza B By PCR: NEGATIVE

## 2018-02-06 MED ORDER — BENZONATATE 100 MG PO CAPS: 100.0000 mg | ORAL_CAPSULE | Freq: Three times a day (TID) | ORAL | 0 refills | Status: DC

## 2018-02-06 MED ORDER — ALBUTEROL SULFATE HFA 108 (90 BASE) MCG/ACT IN AERS
1.0000 | INHALATION_SPRAY | Freq: Once | RESPIRATORY_TRACT | Status: AC
  Administered 2018-02-06: 2 via RESPIRATORY_TRACT
  Filled 2018-02-06: qty 6.7

## 2018-02-06 MED ORDER — ACETAMINOPHEN 325 MG PO TABS
650.0000 mg | ORAL_TABLET | Freq: Once | ORAL | Status: AC | PRN
  Administered 2018-02-06: 650 mg via ORAL
  Filled 2018-02-06: qty 2

## 2018-02-06 MED ORDER — FLUTICASONE PROPIONATE 50 MCG/ACT NA SUSP: 1.0000 | Freq: Every day | NASAL | 2 refills | Status: DC

## 2018-02-06 NOTE — ED Provider Notes (Signed)
[facility]-EMERGENCY DEPT Provider Note   CSN: [id number] Arrival date & time: 02/06/18  1940     History   Chief Complaint Chief Complaint  Patient presents with  . Sore Throat  . Generalized Body Aches  . Fever  . Chills    HPI Henry Chandler is a 37 y.o. male who presents for evaluation of 1 week of generalized body aches, fever/chills, nasal congestion, rhinorrhea, cough that is productive, sore throat.  Patient reports that fever is subjective and he has not measured a temperature.  He reports that cough is productive of green sputum.  He states that he is attempted to take Tylenol and ibuprofen for symptoms relief with minimal improvement.  Patient states he has been able to tolerate his secretions and swallow without any difficulty.  Patient reports that several people at work have been sick with similar symptoms.  Patient denies any chest pain, difficulty breathing, abdominal pain, decreased p.o., nausea/vomiting.  The history is provided by the patient.    Past Medical History:  Diagnosis Date  . Back pain   . Hypertension   . Obesity     Patient Active Problem List   Diagnosis Date Noted  . Class 3 severe obesity due to excess calories with serious comorbidity and body mass index (BMI) of 40.0 to 44.9 in adult (HCC) 06/13/2017  . Other headache syndrome 06/13/2017  . At risk for obstructive sleep apnea 06/13/2017  . Snoring 06/13/2017  . Tobacco use 04/30/2017  . Shortness of breath 04/30/2017  . Bilateral hip joint arthritis 04/30/2017  . Degenerative disc disease at L5-S1 level 04/30/2017  . Microscopic hematuria 04/30/2017  . Chronic bilateral low back pain without sciatica 02/06/2017  . Uncontrolled hypertension 02/06/2017  . 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 Pain of left scapula 01/11/2016  . Urethritis 06/27/2015  . Hyperglycemia 06/13/2015  . Essential hypertension, benign 06/07/2014    No past  surgical history on file.     Home Medications    Prior to Admission medications   Medication Sig Start Date End Date Taking? Authorizing Provider  benzonatate (TESSALON) 100 MG capsule Take 1 capsule (100 mg total) by mouth every 8 (eight) hours. 02/06/18   Maxwell Caul, PA-C  cyclobenzaprine (FLEXERIL) 10 MG tablet Take 1 tablet (10 mg total) by mouth 3 (three) times daily as needed for muscle spasms. 08/08/17   Lawyer, Cristal Deer, PA-C  fluticasone (FLONASE) 50 MCG/ACT nasal spray Place 1 spray into both nostrils daily. 02/06/18   Maxwell Caul, PA-C  hydrochlorothiazide (HYDRODIURIL) 25 MG tablet Take 1 tablet (25 mg total) by mouth daily. 06/13/17   Carlis Stable, PA-C  ibuprofen (ADVIL,MOTRIN) 200 MG tablet Take 400 mg by mouth every 6 (six) hours as needed for fever, headache, mild pain, moderate pain or cramping.    [provider]  meloxicam (MOBIC) 15 MG tablet Take 1 tablet (15 mg total) by mouth daily. Patient taking differently: Take 15 mg by mouth daily as needed for pain.  02/06/17   Carlis Stable, PA-C  metoprolol succinate (TOPROL-XL) 100 MG 24 hr tablet Take 1 tablet (100 mg total) by mouth daily. Due for follow up visit 01/02/18   Carlis Stable, PA-C  omeprazole (PRILOSEC) 20 MG capsule Take 1 capsule (20 mg total) by mouth daily. Patient not taking: Reported on 08/08/2017 03/08/17   Palumbo, April, MD  predniSONE (DELTASONE) 50 MG tablet Take 1 tablet (50 mg total) by mouth  daily. 08/08/17   Lawyer, Cristal Deer, PA-C  traMADol (ULTRAM) 50 MG tablet Take 1 tablet (50 mg total) by mouth every 6 (six) hours as needed for severe pain. 08/08/17   Lawyer, Cristal Deer, PA-C  valACYclovir (VALTREX) 1000 MG tablet Take 1 tablet (1,000 mg total) by mouth 2 (two) times daily. Use only if a herpes outbreak occurs. 07/09/16   Laren Boom, DO    Family History Family History  Problem Relation Age of Onset  . Hypertension Mother   .  Cancer Mother        pancreas  . Hyperlipidemia Mother   . Hypertension Father   . Hyperlipidemia Father   . Heart disease Sister   . Diabetes Neg Hx   . Stroke Neg Hx     Social History Social History   Tobacco Use  . Smoking status: Current Every Day Smoker    Packs/day: 0.25    Years: 10.00    Pack years: 2.50  . Smokeless tobacco: Never Used  . Tobacco comment: 1 pack per week  Substance Use Topics  . Alcohol use: Yes    Alcohol/week: 1.2 oz    Types: 2 Shots of liquor per week  . Drug use: No     Allergies   Lisinopril   Review of Systems Review of Systems  Constitutional: Positive for chills and fever (Subjective).  HENT: Positive for congestion, rhinorrhea and sore throat. Negative for drooling and trouble swallowing.   Respiratory: Positive for cough. Negative for shortness of breath.   Cardiovascular: Negative for chest pain.  Gastrointestinal: Negative for abdominal pain, nausea and vomiting.  Neurological: Negative for headaches.     Physical Exam Updated Vital Signs BP (!) 152/109 (BP Location: Right Arm)   Pulse 84   Temp 98.5 F (36.9 C) (Oral)   Resp 15   Ht 6\' 1"  (1.854 m)   Wt (!) 158.8 kg (350 lb)   SpO2 94%   BMI 46.18 kg/m   Physical Exam  Constitutional: He is oriented to person, place, and time. He appears well-developed and well-nourished.  HENT:  Head: Normocephalic and atraumatic.  Nose: Mucosal edema and rhinorrhea present.  Mouth/Throat: Uvula is midline and mucous membranes are normal. No trismus in the jaw. Posterior oropharyngeal erythema present.  Posterior oropharynx is slightly erythematous.  No edema or exudates noted.  Uvula is midline.  No trismus.  No face or neck swelling.  Airway is patent, phonation is intact.  Eyes: Conjunctivae, EOM and lids are normal. Pupils are equal, round, and reactive to light.  Neck: Full passive range of motion without pain.  Cardiovascular: Normal rate, regular rhythm, normal heart  sounds and normal pulses. Exam reveals no gallop and no friction rub.  No murmur heard. Pulmonary/Chest: Effort normal and breath sounds normal.  No evidence of respiratory distress. Able to speak in full sentences without difficulty.  No adventitious lung sounds, though limited exam secondary to body habitus.  Abdominal: Soft. Normal appearance. There is no tenderness. There is no rigidity and no guarding.  Musculoskeletal: Normal range of motion.  Neurological: He is alert and oriented to person, place, and time.  Skin: Skin is warm and dry. Capillary refill takes less than 2 seconds.  Psychiatric: He has a normal mood and affect. His speech is normal.  Nursing note and vitals reviewed.    ED Treatments / Results  Labs (all labs ordered are listed, but only abnormal results are displayed) Labs Reviewed  RAPID STREP SCREEN (NOT AT [facility])  CULTURE, GROUP A STREP [facility])  INFLUENZA PANEL BY PCR (TYPE A & B)    EKG  EKG Interpretation None       Radiology Dg Chest 2 View  Result Date: 02/06/2018 CLINICAL DATA:  Cough, fever, and chills for 1 week. EXAM: CHEST  2 VIEW COMPARISON:  04/30/2017 FINDINGS: The heart size and mediastinal contours are within normal limits. Both lungs are clear. The visualized skeletal structures are unremarkable. IMPRESSION: No active cardiopulmonary disease. Electronically Signed   By: Myles Rosenthal M.D.   On: 02/06/2018 20:56    Procedures Procedures (including critical care time)  Medications Ordered in ED Medications  acetaminophen (TYLENOL) tablet 650 mg (650 mg Oral Given 02/06/18 2003)  albuterol (PROVENTIL HFA;VENTOLIN HFA) 108 (90 Base) MCG/ACT inhaler 1-2 puff (2 puffs Inhalation Given 02/06/18 2143)     Initial Impression / Assessment and Plan / ED Course  I have reviewed the triage vital signs and the nursing notes.  Pertinent labs & imaging results that were available during my care of the patient were reviewed by me and considered in my  medical decision making (see chart for details).     37 year old male who presents for evaluation of 1 week of generalized body aches, subjective fever, chills, nasal congestion, rhinorrhea, sore throat.  On initial ED arrival, patient is afebrile but is slightly hypertensive.  He does have a history of high blood pressure and states that he took his medication today.  Likely related to pain.  Patient is asymptomatic at this time.  On exam, he does have some posterior oropharynx erythema but no edema or exudates.  History/physical exam is not concerning for Ludwig angina or peritonsillar abscess.  No evidence of respiratory distress.  No adventitious lung sounds but limited exam secondary to body habitus.  Consider upper respiratory infection versus influenza versus strep pharyngitis.  Strep ordered at triage.  Plan for influenza.  We will also plan for chest x-ray given the symptoms have been ongoing for 1 week and have limited exam secondary to body habitus.  X-ray reviewed.  Negative for any acute abnormalities.  Rapid strep is negative.  I discussed results with patient.  On my reevaluation, patient's wife was with him.  She states that this is been an intermittent ongoing issue for the last several weeks that patient seems to have fits of coughing, most notably during the night.  Concerned that this may be bronchospasm secondary to bronchitis.  Reevaluation shows no evidence of respiratory distress.  O2 sats are stable on room air.  Patient instructed to follow-up with his primary care doctor regarding his high blood pressure.  We will plan to send patient home with albuterol  inhaler and symptomatic relief for cough.  Influenza is pending but patient is outside of the treatment window for Tamiflu.  Patient instructed to follow-up with primary care doctor for further evaluation. Patient had ample opportunity for questions and discussion. All patient's questions were answered with full understanding.  Strict return precautions discussed. Patient expresses understanding and agreement to plan.   Final Clinical Impressions(s) / ED Diagnoses   Final diagnoses:  Influenza-like illness  Viral URI with cough    ED Discharge Orders        Ordered    fluticasone (FLONASE) 50 MCG/ACT nasal spray  Daily     02/06/18 2136    benzonatate (TESSALON) 100 MG capsule  Every 8 hours     02/06/18 2136       Maxwell Caul,  PA-C 02/06/18 2215    Nira Conn, MD 02/07/18 0111

## 2018-02-06 NOTE — ED Triage Notes (Signed)
Patient is complaining of body aches, chills, fever, and sore throat. Patient states this started last weekend.

## 2018-02-06 NOTE — Discharge Instructions (Signed)
You can take Tylenol or Ibuprofen as directed for pain. You can alternate Tylenol and Ibuprofen every 4 hours. If you take Tylenol at 1pm, then you can take Ibuprofen at 5pm. Then you can take Tylenol again at 9pm.   Use albuterol inhaler 1-2 puffs every 6 hours as needed for bronchospasm.  Use Tessalon Perles as directed for cough.  Use Flonase as directed for nasal congestion.  Follow-up with your primary care doctor in the next 24-48 hours for further evaluation.  If you do not have a primary care doctor, you can use the [facility] listed.  You need to have your blood pressure reevaluated to make sure the medication is working.  Return to the emergency department for any persistent fever, vomiting, difficulty eating or drinking, difficulty breathing, chest pain or any other worsening or concerning symptoms.

## 2018-02-06 NOTE — ED Notes (Signed)
Bed: WTR5 Expected date:  Expected time:  Means of arrival:  Comments: 

## 2018-02-08 LAB — CULTURE, GROUP A STREP (THRC)

## 2018-03-09 ENCOUNTER — Other Ambulatory Visit: Payer: Self-pay

## 2018-03-09 ENCOUNTER — Telehealth: Payer: Self-pay | Admitting: Physician Assistant

## 2018-03-09 DIAGNOSIS — R823 Hemoglobinuria: Secondary | ICD-10-CM | POA: Insufficient documentation

## 2018-03-09 DIAGNOSIS — E876 Hypokalemia: Secondary | ICD-10-CM | POA: Insufficient documentation

## 2018-03-09 DIAGNOSIS — I10 Essential (primary) hypertension: Secondary | ICD-10-CM

## 2018-03-09 MED ORDER — METOPROLOL SUCCINATE ER 100 MG PO TB24
100.0000 mg | ORAL_TABLET | Freq: Every day | ORAL | 0 refills | Status: DC
Start: 2018-03-09 — End: 2018-04-20

## 2018-03-09 MED ORDER — HYDROCHLOROTHIAZIDE 25 MG PO TABS
25.0000 mg | ORAL_TABLET | Freq: Every day | ORAL | 0 refills | Status: DC
Start: 2018-03-09 — End: 2018-04-20

## 2018-03-09 NOTE — Telephone Encounter (Signed)
Patient lost to follow-up due to loss of insurance coverage 1 month supply of Toprol XL and HCTZ sent to pharmacy today for his blood pressure Let's schedule him for a nurse visit in 2 weeks Inform patient that we do not want him to go without care due to cost. Let him know about [facility] self-pay 50% discount and to contact our financial assistance department Lastly, tell him to use [facility] to get a discount on his prescriptions

## 2018-03-10 NOTE — Telephone Encounter (Signed)
Attempted to contact Pt. No answer.

## 2018-04-20 ENCOUNTER — Encounter: Payer: Self-pay | Admitting: Internal Medicine

## 2018-04-20 ENCOUNTER — Other Ambulatory Visit: Payer: Self-pay

## 2018-04-20 ENCOUNTER — Ambulatory Visit: Payer: Self-pay | Admitting: Internal Medicine

## 2018-04-20 ENCOUNTER — Encounter (INDEPENDENT_AMBULATORY_CARE_PROVIDER_SITE_OTHER): Payer: Self-pay

## 2018-04-20 VITALS — BP 174/113 | HR 62 | Temp 98.5°F | Ht 74.0 in | Wt 345.2 lb

## 2018-04-20 DIAGNOSIS — Z87448 Personal history of other diseases of urinary system: Secondary | ICD-10-CM

## 2018-04-20 DIAGNOSIS — Z79899 Other long term (current) drug therapy: Secondary | ICD-10-CM

## 2018-04-20 DIAGNOSIS — R823 Hemoglobinuria: Secondary | ICD-10-CM

## 2018-04-20 DIAGNOSIS — Z720 Tobacco use: Secondary | ICD-10-CM

## 2018-04-20 DIAGNOSIS — Z8249 Family history of ischemic heart disease and other diseases of the circulatory system: Secondary | ICD-10-CM

## 2018-04-20 DIAGNOSIS — F17210 Nicotine dependence, cigarettes, uncomplicated: Secondary | ICD-10-CM

## 2018-04-20 DIAGNOSIS — E669 Obesity, unspecified: Secondary | ICD-10-CM

## 2018-04-20 DIAGNOSIS — I10 Essential (primary) hypertension: Secondary | ICD-10-CM

## 2018-04-20 DIAGNOSIS — Z6841 Body Mass Index (BMI) 40.0 and over, adult: Secondary | ICD-10-CM

## 2018-04-20 MED ORDER — METOPROLOL SUCCINATE ER 100 MG PO TB24: 100.0000 mg | ORAL_TABLET | Freq: Every day | ORAL | 0 refills | Status: DC

## 2018-04-20 MED ORDER — HYDROCHLOROTHIAZIDE 25 MG PO TABS: 25.0000 mg | ORAL_TABLET | Freq: Every day | ORAL | 0 refills | Status: DC

## 2018-04-20 NOTE — Assessment & Plan Note (Signed)
HTN: BP today at 174/113. Previously utilized lisinopril but developed swelling of the throat and hives. Has been on HCTZ and metoprolol in the past but is currently only on metoprolol.  Plan: HCTZ 25 mg daily  Continue Metoprolol succinate 100mg  daily for now BMP for renal function and electrolytes  Return in two weeks for BP and BMP

## 2018-04-20 NOTE — Patient Instructions (Signed)
FOLLOW-UP INSTRUCTIONS When: In two weeks  For: for a repeat BMP and blood pressure check What to bring: Your medications  Thank you for your visit to the [facility] today.  I have added HCTZ to your medication regimen at 25mg  daily. Please continue to take the Metoprolol as well as the HCTZ and return in 2 weeks for a blood pressure check.

## 2018-04-20 NOTE — Assessment & Plan Note (Signed)
Continue to use 4-5 cigarettes per day.  Total of ~10 years for a 2.5 year ppd history

## 2018-04-20 NOTE — Assessment & Plan Note (Signed)
  Hemoglobinuria: History of hemoglobinuria. Patient concerned with a pressure sensation in his lower back bilaterally that's more consistent with musculoskeletal pain/injury but would like to have the hemoglobinuria evaluated.  The sensation is mild, intermittent, improves with Tylenol or spontaneously.  There is no associated hematuria.  Plan: Will repeat UA today

## 2018-04-20 NOTE — Progress Notes (Signed)
   CC: To establish care for his uncontrolled hypertension  HPI:[name] is a 37 y.o. male who presents today for evaluation and treatment of hypertension. Here today has his former physician coverage had been provided by her former employer.   HTN: BP today at 174/113. Previously utilized lisinopril but developed swelling of the throat and hives. Has been on HCTZ and metoprolol in the past but is currently only on metoprolol.  Plan: HCTZ 25 mg daily  Continue Metoprolol succinate 100mg  daily for now BMP for renal function and electrolytes  Return in two weeks for BP and BMP  Hemoglobinuria: History of hemoglobinuria. Patient concerned with a pressure sensation in his lower back bilaterally that's more consistent with musculoskeletal pain/injury but would like to have the hemoglobinuria evaluated.  The sensation is mild, intermittent, improves with Tylenol or spontaneously.  There is no associated hematuria.  Plan: Will repeat UA today   Obesity: He will need to continue to discuss dietary modifications and exercise to better control his weight. This will also greatly benefit him with regard to his HTN.  Past Medical History:  Diagnosis Date  . Back pain   . Hypertension   . Obesity    Review of Systems:   Review of Systems  Constitutional: Negative for chills, diaphoresis and fever.  HENT: Negative for ear pain and sinus pain.   Eyes: Negative for blurred vision, photophobia and redness.  Respiratory: Negative for cough and shortness of breath.   Cardiovascular: Negative for chest pain and leg swelling.  Gastrointestinal: Negative for constipation, diarrhea, nausea and vomiting.  Genitourinary: Negative for flank pain and urgency.  Musculoskeletal: Negative for myalgias.  Neurological: Negative for dizziness and headaches.  Psychiatric/Behavioral: Negative for depression. The patient is not nervous/anxious.    Family History: Father- HTN Mother- HTN  Social  History: Tobacco 4-5 cigarettes per day ~ 10 years for 2.5 ppd history EtOH-roughly 5 beers per week Denied illicit drugs  Physical Exam:  Vitals:   04/20/18 0921  BP: (!) 174/113  Pulse: 62  Temp: 98.5 F (36.9 C)  TempSrc: Oral  SpO2: 92%  Weight: (!) 345 lb 3.2 oz (156.6 kg)  Height: 6\' 2"  (1.88 m)   Physical Exam  Constitutional: He appears well-developed and well-nourished. No distress.  HENT:  Head: Normocephalic and atraumatic.  Eyes: Conjunctivae and EOM are normal.  Neck: Normal range of motion.  Cardiovascular: Normal rate and regular rhythm.  No murmur heard. Pulmonary/Chest: Effort normal and breath sounds normal. No stridor. No respiratory distress.  Abdominal: Soft. Bowel sounds are normal. He exhibits no distension.  Musculoskeletal: He exhibits no edema.  Neurological: He is alert.  Skin: Skin is warm. He is not diaphoretic.  Psychiatric: He has a normal mood and affect.  Nursing note and vitals reviewed.  Assessment & Plan:   See Encounters Tab for problem based charting.  Patient discussed with Dr. Heide Spark

## 2018-04-20 NOTE — Progress Notes (Signed)
Internal Medicine Clinic Attending  Case discussed with Dr. Harbrecht at the time of the visit.  We reviewed the resident's history and exam and pertinent patient test results.  I agree with the assessment, diagnosis, and plan of care documented in the resident's note.   

## 2018-04-21 LAB — BMP8+ANION GAP
Anion Gap: 14 mmol/L (ref 10.0–18.0)
BUN / CREAT RATIO: 8 — AB (ref 9–20)
BUN: 9 mg/dL (ref 6–20)
CO2: 22 mmol/L (ref 20–29)
Calcium: 9.4 mg/dL (ref 8.7–10.2)
Chloride: 104 mmol/L (ref 96–106)
Creatinine, Ser: 1.12 mg/dL (ref 0.76–1.27)
GFR calc non Af Amer: 85 mL/min/{1.73_m2} (ref >59)
GFR, EST AFRICAN AMERICAN: 98 mL/min/{1.73_m2} (ref >59)
GLUCOSE: 95 mg/dL (ref 65–99)
Potassium: 4 mmol/L (ref 3.5–5.2)
Sodium: 140 mmol/L (ref 134–144)

## 2018-04-21 LAB — URINALYSIS, ROUTINE W REFLEX MICROSCOPIC
Bilirubin, UA: NEGATIVE
Glucose, UA: NEGATIVE
Ketones, UA: NEGATIVE
Leukocytes, UA: NEGATIVE
NITRITE UA: NEGATIVE
PH UA: 5 (ref 5.0–7.5)
Protein, UA: NEGATIVE
RBC, UA: NEGATIVE
Specific Gravity, UA: 1.019 (ref 1.005–1.030)
Urobilinogen, Ur: 0.2 mg/dL (ref 0.2–1.0)

## 2018-05-29 ENCOUNTER — Telehealth: Payer: Self-pay | Admitting: Internal Medicine

## 2018-07-20 ENCOUNTER — Emergency Department (HOSPITAL_COMMUNITY): Payer: No Typology Code available for payment source

## 2018-07-20 ENCOUNTER — Encounter (HOSPITAL_COMMUNITY): Payer: Self-pay

## 2018-07-20 ENCOUNTER — Emergency Department (HOSPITAL_COMMUNITY)
Admission: EM | Admit: 2018-07-20 | Discharge: 2018-07-21 | Disposition: A | Discharge status: Home / Self Care | Payer: No Typology Code available for payment source | Attending: Emergency Medicine | Admitting: Emergency Medicine

## 2018-07-20 ENCOUNTER — Other Ambulatory Visit: Payer: Self-pay

## 2018-07-20 DIAGNOSIS — S0990XA Unspecified injury of head, initial encounter: Secondary | ICD-10-CM | POA: Diagnosis not present

## 2018-07-20 DIAGNOSIS — Z79899 Other long term (current) drug therapy: Secondary | ICD-10-CM | POA: Diagnosis not present

## 2018-07-20 DIAGNOSIS — Y939 Activity, unspecified: Secondary | ICD-10-CM | POA: Diagnosis not present

## 2018-07-20 DIAGNOSIS — R1030 Lower abdominal pain, unspecified: Secondary | ICD-10-CM | POA: Diagnosis not present

## 2018-07-20 DIAGNOSIS — Y92410 Unspecified street and highway as the place of occurrence of the external cause: Secondary | ICD-10-CM | POA: Insufficient documentation

## 2018-07-20 DIAGNOSIS — R1084 Generalized abdominal pain: Secondary | ICD-10-CM | POA: Diagnosis not present

## 2018-07-20 DIAGNOSIS — F17200 Nicotine dependence, unspecified, uncomplicated: Secondary | ICD-10-CM | POA: Diagnosis not present

## 2018-07-20 DIAGNOSIS — M25531 Pain in right wrist: Secondary | ICD-10-CM | POA: Diagnosis not present

## 2018-07-20 DIAGNOSIS — I10 Essential (primary) hypertension: Secondary | ICD-10-CM | POA: Insufficient documentation

## 2018-07-20 DIAGNOSIS — Y998 Other external cause status: Secondary | ICD-10-CM | POA: Diagnosis not present

## 2018-07-20 LAB — CBC WITH DIFFERENTIAL/PLATELET
BASOS ABS: 0 10*3/uL (ref 0.0–0.1)
Basophils Relative: 0 %
EOS ABS: 0.1 10*3/uL (ref 0.0–0.7)
EOS PCT: 1 %
HCT: 44 % (ref 39.0–52.0)
HEMOGLOBIN: 14.6 g/dL (ref 13.0–17.0)
LYMPHS ABS: 2.8 10*3/uL (ref 0.7–4.0)
Lymphocytes Relative: 32 %
MCH: 28.7 pg (ref 26.0–34.0)
MCHC: 33.2 g/dL (ref 30.0–36.0)
MCV: 86.4 fL (ref 78.0–100.0)
Monocytes Absolute: 0.5 10*3/uL (ref 0.1–1.0)
Monocytes Relative: 6 %
NEUTROS PCT: 61 %
Neutro Abs: 5.4 10*3/uL (ref 1.7–7.7)
PLATELETS: 283 10*3/uL (ref 150–400)
RBC: 5.09 MIL/uL (ref 4.22–5.81)
RDW: 14.8 % (ref 11.5–15.5)
WBC: 8.8 10*3/uL (ref 4.0–10.5)

## 2018-07-20 MED ORDER — MORPHINE SULFATE (PF) 4 MG/ML IV SOLN
4.0000 mg | Freq: Once | INTRAVENOUS | Status: AC
  Administered 2018-07-20: 4 mg via INTRAVENOUS
  Filled 2018-07-20: qty 1
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
## 2018-07-21 ENCOUNTER — Emergency Department (HOSPITAL_COMMUNITY): Payer: No Typology Code available for payment source

## 2018-07-21 ENCOUNTER — Encounter (HOSPITAL_COMMUNITY): Payer: Self-pay

## 2018-07-21 LAB — SAMPLE TO BLOOD BANK

## 2018-07-21 LAB — BASIC METABOLIC PANEL
Anion gap: 8 (ref 5–15)
BUN: 15 mg/dL (ref 6–20)
CO2: 27 mmol/L (ref 22–32)
Calcium: 9.1 mg/dL (ref 8.9–10.3)
Chloride: 105 mmol/L (ref 98–111)
Creatinine, Ser: 1.16 mg/dL (ref 0.61–1.24)
GFR calc Af Amer: >60 mL/min (ref >60)
GFR calc non Af Amer: >60 mL/min (ref >60)
Glucose, Bld: 105 mg/dL — ABNORMAL HIGH (ref 70–99)
Potassium: 3.6 mmol/L (ref 3.5–5.1)
Sodium: 140 mmol/L (ref 135–145)

## 2018-07-21 LAB — ETHANOL: Alcohol, Ethyl (B): <10 mg/dL (ref <10)

## 2018-07-21 MED ORDER — METHOCARBAMOL 500 MG PO TABS: 500.0000 mg | ORAL_TABLET | Freq: Two times a day (BID) | ORAL | 0 refills | Status: DC

## 2018-07-21 MED ORDER — MORPHINE SULFATE (PF) 4 MG/ML IV SOLN
4.0000 mg | Freq: Once | INTRAVENOUS | Status: AC
  Administered 2018-07-21: 4 mg via INTRAVENOUS
  Filled 2018-07-21: qty 1

## 2018-07-21 MED ORDER — IBUPROFEN 800 MG PO TABS: 800.0000 mg | ORAL_TABLET | Freq: Three times a day (TID) | ORAL | 0 refills | Status: DC

## 2018-07-21 MED ORDER — IOPAMIDOL (ISOVUE-300) INJECTION 61%
100.0000 mL | Freq: Once | INTRAVENOUS | Status: AC | PRN
  Administered 2018-07-21: 100 mL via INTRAVENOUS

## 2018-07-21 MED ORDER — IOPAMIDOL (ISOVUE-300) INJECTION 61%
INTRAVENOUS | Status: AC
  Filled 2018-07-21: qty 100

## 2018-07-21 NOTE — Discharge Instructions (Addendum)
Imaging today was all normal.  Wear wrist brace to help stabilize wrist for now until feeling better. Take the prescribed medication as directed. Follow-up with your primary care doctor-- call for appt. Return to the ED for new or worsening symptoms.

## 2018-07-22 ENCOUNTER — Encounter: Payer: Self-pay | Admitting: Internal Medicine

## 2018-07-23 ENCOUNTER — Encounter (HOSPITAL_COMMUNITY): Payer: Self-pay

## 2018-07-23 ENCOUNTER — Emergency Department (HOSPITAL_COMMUNITY)
Admission: EM | Admit: 2018-07-23 | Discharge: 2018-07-23 | Disposition: A | Discharge status: Home / Self Care | Payer: No Typology Code available for payment source | Attending: Emergency Medicine | Admitting: Emergency Medicine

## 2018-07-23 ENCOUNTER — Other Ambulatory Visit: Payer: Self-pay

## 2018-07-23 DIAGNOSIS — Z79899 Other long term (current) drug therapy: Secondary | ICD-10-CM | POA: Diagnosis not present

## 2018-07-23 DIAGNOSIS — I10 Essential (primary) hypertension: Secondary | ICD-10-CM | POA: Diagnosis not present

## 2018-07-23 DIAGNOSIS — M7918 Myalgia, other site: Secondary | ICD-10-CM | POA: Diagnosis not present

## 2018-07-23 DIAGNOSIS — F17210 Nicotine dependence, cigarettes, uncomplicated: Secondary | ICD-10-CM | POA: Diagnosis not present

## 2018-07-23 MED ORDER — AMLODIPINE BESYLATE 5 MG PO TABS
10.0000 mg | ORAL_TABLET | Freq: Once | ORAL | Status: AC
  Administered 2018-07-23: 10 mg via ORAL
  Filled 2018-07-23: qty 2

## 2018-07-23 MED ORDER — TRIAMTERENE-HCTZ 37.5-25 MG PO TABS: 1.0000 | ORAL_TABLET | Freq: Every day | ORAL | 0 refills | Status: DC

## 2018-07-23 MED ORDER — TRAMADOL HCL 50 MG PO TABS: 50.0000 mg | ORAL_TABLET | Freq: Four times a day (QID) | ORAL | 0 refills | Status: DC | PRN
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
## 2018-07-28 ENCOUNTER — Other Ambulatory Visit: Payer: Self-pay

## 2018-07-28 ENCOUNTER — Encounter: Payer: Self-pay | Admitting: Internal Medicine

## 2018-07-28 ENCOUNTER — Ambulatory Visit (INDEPENDENT_AMBULATORY_CARE_PROVIDER_SITE_OTHER): Payer: Self-pay | Admitting: Internal Medicine

## 2018-07-28 VITALS — BP 156/110 | HR 74 | Temp 98.4°F | Ht 73.0 in | Wt 350.0 lb

## 2018-07-28 DIAGNOSIS — M7918 Myalgia, other site: Secondary | ICD-10-CM

## 2018-07-28 DIAGNOSIS — I10 Essential (primary) hypertension: Secondary | ICD-10-CM

## 2018-07-28 DIAGNOSIS — G8911 Acute pain due to trauma: Secondary | ICD-10-CM

## 2018-07-28 DIAGNOSIS — Z79899 Other long term (current) drug therapy: Secondary | ICD-10-CM

## 2018-07-28 DIAGNOSIS — Z9114 Patient's other noncompliance with medication regimen: Secondary | ICD-10-CM

## 2018-07-28 MED ORDER — IRBESARTAN-HYDROCHLOROTHIAZIDE 150-12.5 MG PO TABS: 1.0000 | ORAL_TABLET | Freq: Every day | ORAL | 0 refills | Status: DC

## 2018-07-28 MED ORDER — TRAMADOL HCL 50 MG PO TABS
50.0000 mg | ORAL_TABLET | Freq: Three times a day (TID) | ORAL | 0 refills | Status: AC | PRN
Start: 2018-07-28 — End: 2018-08-02

## 2018-07-28 MED FILL — IRBESARTAN-HCTZ 150-12.5 MG: 150-12.5 | 30 days supply | Qty: 30 | Fill #0

## 2018-07-28 MED FILL — traMADol HCL 50 MG TABS: 50 | 5 days supply | Qty: 15 | Fill #0
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
## 2018-07-28 NOTE — Patient Instructions (Addendum)
Henry Chandler,   Please take 1 tablet of tramadol every 8 hours as needed for pain.  Stop taking metoprolol.  For blood pressure you will not start taking a combination pill called irbesartan-hydrochlorothiazide.  Please take 1 tablet every day.  Please make a follow-up appointment with [place] in 4 weeks to check of your blood pressure.  Please call if you have any questions.  -Dr. Evelene Croon

## 2018-07-28 NOTE — Assessment & Plan Note (Addendum)
HTN, uncontrolled: Patient has been on metoprolol for BP control, but reports poor compliance and misses doses 1-2x per week. He was last seen on 04/2018 at which time HCTZ was added to his regimen. He never started it as he was unable to afford it. Most recently, his HCTZ was discontinued and he was started on the combination pill triamterene-HCTZ.  He has not started this medication either due to cost.  BP today 156/110.  Denies changes in vision, headache, chest pain, shortness of breath, and lower extremity edema.  No abnormalities on exam.  Will stop metoprolol as it is not first-line for blood pressure.  Will start combination therapy with HCTZ and ARB and follow-up in 4 weeks.  Given onset of HTN a young age recommend work-up for secondary causes of hypertension (such as primary although in the setting of chronic low/low-end of normal K), though this may be difficult to her start of HCTZ.  Deferred work-up today due to cost as patient is uninsured. - Stop metoprolol and HCTZ - Continue triamterene-HCTZ - Start combination pill of losartan-HCTZ 150-12.5 mg QD  - Follow-up in 4 weeks - Med list has been updated -Consider work-up for secondary causes of hypertension and  OSA screening  - Recommended weight loss as well
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
## 2018-07-29 NOTE — Progress Notes (Signed)
Internal Medicine Clinic Attending  Case discussed with Dr. Santos-Sanchez at the time of the visit.  We reviewed the resident's history and exam and pertinent patient test results.  I agree with the assessment, diagnosis, and plan of care documented in the resident's note.    

## 2018-08-25 ENCOUNTER — Ambulatory Visit: Payer: Self-pay

## 2018-08-26 ENCOUNTER — Other Ambulatory Visit: Payer: Self-pay

## 2018-08-26 ENCOUNTER — Ambulatory Visit (INDEPENDENT_AMBULATORY_CARE_PROVIDER_SITE_OTHER): Payer: No Typology Code available for payment source | Admitting: Internal Medicine

## 2018-08-26 ENCOUNTER — Encounter: Payer: Self-pay | Admitting: Internal Medicine

## 2018-08-26 DIAGNOSIS — I10 Essential (primary) hypertension: Secondary | ICD-10-CM | POA: Diagnosis not present

## 2018-08-26 DIAGNOSIS — Z23 Encounter for immunization: Secondary | ICD-10-CM

## 2018-08-26 DIAGNOSIS — R0683 Snoring: Secondary | ICD-10-CM | POA: Diagnosis not present

## 2018-08-26 DIAGNOSIS — Z720 Tobacco use: Secondary | ICD-10-CM | POA: Diagnosis not present

## 2018-08-26 DIAGNOSIS — Z79899 Other long term (current) drug therapy: Secondary | ICD-10-CM

## 2018-08-26 DIAGNOSIS — Z6841 Body Mass Index (BMI) 40.0 and over, adult: Secondary | ICD-10-CM

## 2018-08-26 DIAGNOSIS — Z8249 Family history of ischemic heart disease and other diseases of the circulatory system: Secondary | ICD-10-CM

## 2018-08-26 MED ORDER — IRBESARTAN-HYDROCHLOROTHIAZIDE 300-12.5 MG PO TABS: 1.0000 | ORAL_TABLET | Freq: Every day | ORAL | 2 refills | Status: DC

## 2018-08-26 NOTE — Patient Instructions (Signed)
Thank you for visiting clinic today. As we discussed I am increasing the strength of your pill, you can finish up your current pill by taking 2 instead of 1.  Then you can start taking 1 pill daily with your new prescription. It is very important to lose weight and watch for a low sodium diet. Trying to quit smoking will also help with your high blood pressure. Exercise regularly as that can also cause decreased in blood pressure. These follow-up in 2 weeks for blood pressure check.   DASH Eating Plan DASH stands for "Dietary Approaches to Stop Hypertension." The DASH eating plan is a healthy eating plan that has been shown to reduce high blood pressure (hypertension). It may also reduce your risk for type 2 diabetes, heart disease, and stroke. The DASH eating plan may also help with weight loss. What are tips for following this plan? General guidelines  Avoid eating more than 2,300 mg (milligrams) of salt (sodium) a day. If you have hypertension, you may need to reduce your sodium intake to 1,500 mg a day.  Limit alcohol intake to no more than 1 drink a day for nonpregnant women and 2 drinks a day for men. One drink equals 12 oz of beer, 5 oz of wine, or 1 oz of hard liquor.  Work with your health care provider to maintain a healthy body weight or to lose weight. Ask what an ideal weight is for you.  Get at least 30 minutes of exercise that causes your heart to beat faster (aerobic exercise) most days of the week. Activities may include walking, swimming, or biking.  Work with your health care provider or diet and nutrition specialist (dietitian) to adjust your eating plan to your individual calorie needs. Reading food labels  Check food labels for the amount of sodium per serving. Choose foods with less than 5 percent of the Daily Value of sodium. Generally, foods with less than 300 mg of sodium per serving fit into this eating plan.  To find whole grains, look for the word "whole" as  the first word in the ingredient list. Shopping  Buy products labeled as "low-sodium" or "no salt added."  Buy fresh foods. Avoid canned foods and premade or frozen meals. Cooking  Avoid adding salt when cooking. Use salt-free seasonings or herbs instead of table salt or sea salt. Check with your health care provider or pharmacist before using salt substitutes.  Do not fry foods. Cook foods using healthy methods such as baking, boiling, grilling, and broiling instead.  Cook with heart-healthy oils, such as olive, canola, soybean, or sunflower oil. Meal planning   Eat a balanced diet that includes: ? 5 or more servings of fruits and vegetables each day. At each meal, try to fill half of your plate with fruits and vegetables. ? Up to 6-8 servings of whole grains each day. ? Less than 6 oz of lean meat, poultry, or fish each day. A 3-oz serving of meat is about the same size as a deck of cards. One egg equals 1 oz. ? 2 servings of low-fat dairy each day. ? A serving of nuts, seeds, or beans 5 times each week. ? Heart-healthy fats. Healthy fats called Omega-3 fatty acids are found in foods such as flaxseeds and coldwater fish, like sardines, salmon, and mackerel.  Limit how much you eat of the following: ? Canned or prepackaged foods. ? Food that is high in trans fat, such as fried foods. ? Food that is high  in saturated fat, such as fatty meat. ? Sweets, desserts, sugary drinks, and other foods with added sugar. ? Full-fat dairy products.  Do not salt foods before eating.  Try to eat at least 2 vegetarian meals each week.  Eat more home-cooked food and less restaurant, buffet, and fast food.  When eating at a restaurant, ask that your food be prepared with less salt or no salt, if possible. What foods are recommended? The items listed may not be a complete list. Talk with your dietitian about what dietary choices are best for you. Grains Whole-grain or whole-wheat bread.  Whole-grain or whole-wheat pasta. Brown rice. [place]. Bulgur. Whole-grain and low-sodium cereals. Pita bread. Low-fat, low-sodium crackers. Whole-wheat flour tortillas. Vegetables Fresh or frozen vegetables (raw, steamed, roasted, or grilled). Low-sodium or reduced-sodium tomato and vegetable juice. Low-sodium or reduced-sodium tomato sauce and tomato paste. Low-sodium or reduced-sodium canned vegetables. Fruits All fresh, dried, or frozen fruit. Canned fruit in natural juice (without added sugar). Meat and other protein foods Skinless chicken or [place]. Ground chicken or [place]. Pork with fat trimmed off. Fish and seafood. Egg whites. Dried beans, peas, or lentils. Unsalted nuts, nut butters, and seeds. Unsalted canned beans. Lean cuts of beef with fat trimmed off. Low-sodium, lean deli meat. Dairy Low-fat (1%) or fat-free (skim) milk. Fat-free, low-fat, or reduced-fat cheeses. Nonfat, low-sodium ricotta or cottage cheese. Low-fat or nonfat yogurt. Low-fat, low-sodium cheese. Fats and oils Soft margarine without trans fats. Vegetable oil. Low-fat, reduced-fat, or light mayonnaise and salad dressings (reduced-sodium). Canola, safflower, olive, soybean, and sunflower oils. Avocado. Seasoning and other foods Herbs. Spices. Seasoning mixes without salt. Unsalted popcorn and pretzels. Fat-free sweets. What foods are not recommended? The items listed may not be a complete list. Talk with your dietitian about what dietary choices are best for you. Grains Baked goods made with fat, such as croissants, muffins, or some breads. Dry pasta or rice meal packs. Vegetables Creamed or fried vegetables. Vegetables in a cheese sauce. Regular canned vegetables (not low-sodium or reduced-sodium). Regular canned tomato sauce and paste (not low-sodium or reduced-sodium). Regular tomato and vegetable juice (not low-sodium or reduced-sodium). Angie Fava. Olives. Fruits Canned fruit in a light or heavy syrup.  Fried fruit. Fruit in cream or butter sauce. Meat and other protein foods Fatty cuts of meat. Ribs. Fried meat. Berniece Salines. Sausage. Bologna and other processed lunch meats. Salami. Fatback. Hotdogs. Bratwurst. Salted nuts and seeds. Canned beans with added salt. Canned or smoked fish. Whole eggs or egg yolks. Chicken or [place] with skin. Dairy Whole or 2% milk, cream, and half-and-half. Whole or full-fat cream cheese. Whole-fat or sweetened yogurt. Full-fat cheese. Nondairy creamers. Whipped toppings. Processed cheese and cheese spreads. Fats and oils Butter. Stick margarine. Lard. Shortening. Ghee. Bacon fat. Tropical oils, such as coconut, palm kernel, or palm oil. Seasoning and other foods Salted popcorn and pretzels. Onion salt, garlic salt, seasoned salt, table salt, and sea salt. Worcestershire sauce. Tartar sauce. Barbecue sauce. Teriyaki sauce. Soy sauce, including reduced-sodium. Steak sauce. Canned and packaged gravies. Fish sauce. Oyster sauce. Cocktail sauce. Horseradish that you find on the shelf. Ketchup. Mustard. Meat flavorings and tenderizers. Bouillon cubes. Hot sauce and Tabasco sauce. Premade or packaged marinades. Premade or packaged taco seasonings. Relishes. Regular salad dressings. Where to find more information:  National Heart, Lung, and [place]: https://wilson-eaton.com/  American Heart Association: www.heart.org Summary  The DASH eating plan is a healthy eating plan that has been shown to reduce high blood pressure (hypertension). It may  also reduce your risk for type 2 diabetes, heart disease, and stroke.  With the DASH eating plan, you should limit salt (sodium) intake to 2,300 mg a day. If you have hypertension, you may need to reduce your sodium intake to 1,500 mg a day.  When on the DASH eating plan, aim to eat more fresh fruits and vegetables, whole grains, lean proteins, low-fat dairy, and heart-healthy fats.  Work with your health care provider or diet and  nutrition specialist (dietitian) to adjust your eating plan to your individual calorie needs. This information is not intended to replace advice given to you by your health care provider. Make sure you discuss any questions you have with your health care provider. Document Released: 11/21/2011 Document Revised: 11/25/2016 Document Reviewed: 11/25/2016 Elsevier Interactive Patient Education  [employer].

## 2018-08-26 NOTE — Assessment & Plan Note (Signed)
Patient is feeling to quit smoking and was asking for Chantix.  Some of his friends have quite success with Chantix.  We can prescribe him Chantix once his insurance starts. He might have to go through Wellbutrin trial before insurance approves Chantix.
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
## 2018-08-26 NOTE — Progress Notes (Signed)
Internal Medicine Clinic Attending  Case discussed with Dr. Amin at the time of the visit.  We reviewed the resident's history and exam and pertinent patient test results.  I agree with the assessment, diagnosis, and plan of care documented in the resident's note.  Alexander Raines, M.D., Ph.D.  

## 2018-08-26 NOTE — Assessment & Plan Note (Signed)
Patient is morbidly obese BMI above 45.  He will get benefit from getting a nutritional consult to help lose weight. He was provided with DASH diet formation. He was encouraged to exercise regularly and eat low caloric diet to help lose weight as it will help with his blood pressure and overall quality of life. Patient seems motivated.
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
## 2018-08-26 NOTE — Assessment & Plan Note (Signed)
Patient continue to snore and also is high risk for obstructive sleep apnea due to his BMI above 45.  Get benefit from sleep study once his insurance becomes effective.

## 2019-05-10 IMAGING — CT CT ANGIO NECK
1 of 12 series · 5 of 33 positions shown · IV contrast (ISOVUE 370)
Comparison: 10/24/2015

CLINICAL DATA: Right-sided neck pain beginning last night radiating
to the head. Severe headache.

EXAM:
CT ANGIOGRAPHY HEAD AND NECK
TECHNIQUE: Multidetector CT imaging of the head and neck was performed using
the standard protocol during bolus administration of intravenous
contrast. Multiplanar CT image reconstructions and MIPs were
obtained to evaluate the vascular anatomy. Carotid stenosis
measurements (when applicable) are obtained utilizing NASCET
criteria, using the distal internal carotid diameter as the
denominator.
CONTRAST:  100 cc Isovue 370

[Series 6: axial thin · axial · 0.48mm/px · z∈[+1446,+1680]mm · 5 of 352 slices shown]
[im 59/352  soft-tissue]
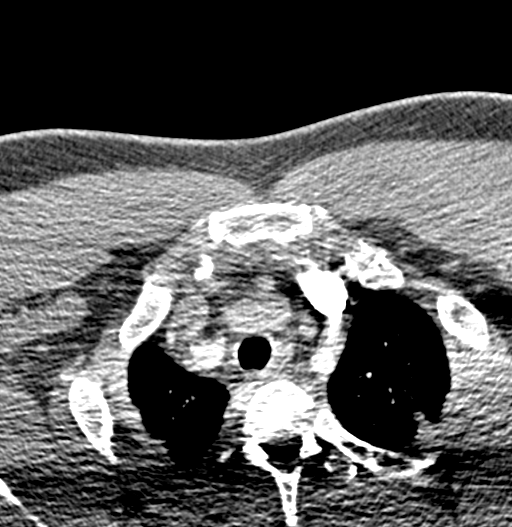
[im 118/352  bone]
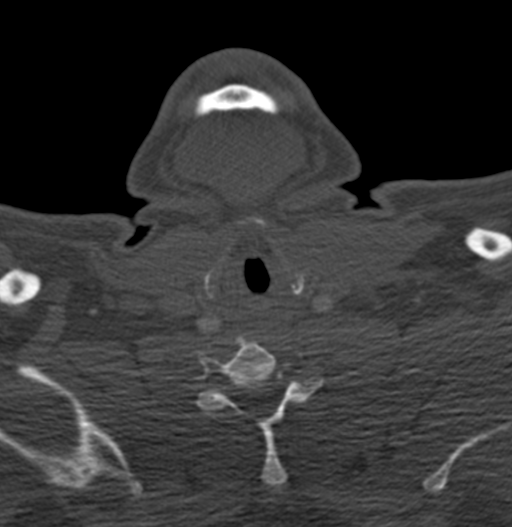
[im 176/352  soft-tissue]
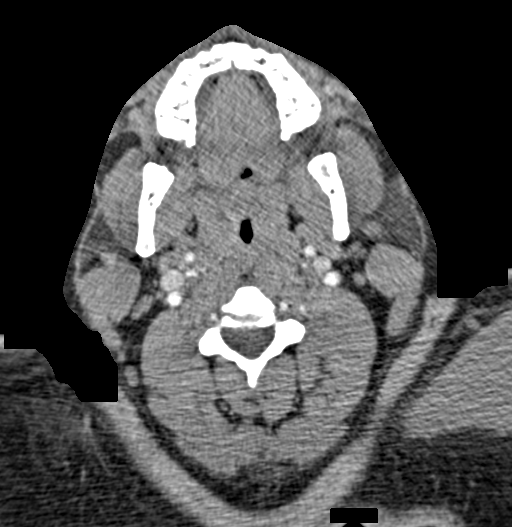
[im 235/352  bone]
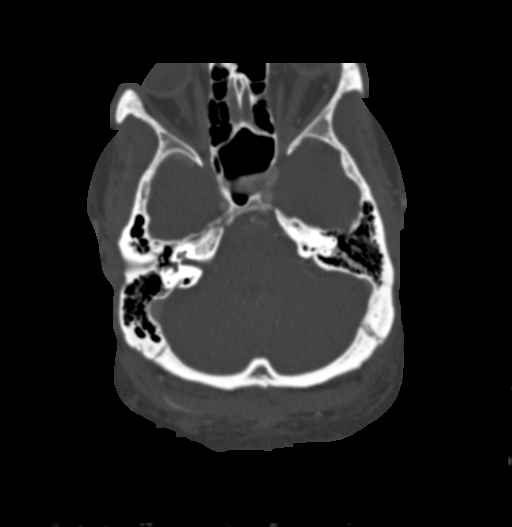
[im 293/352  soft-tissue]
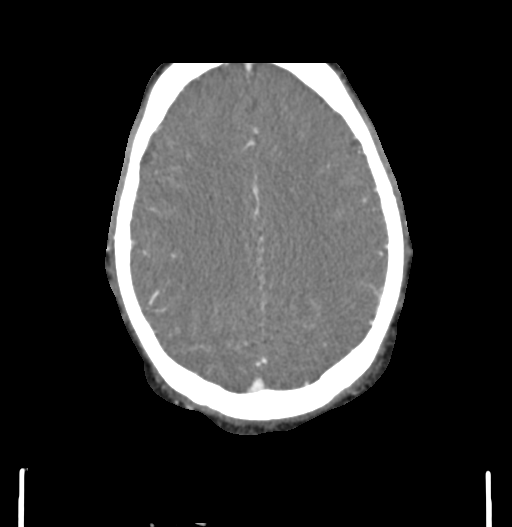

[5 of 33 positions shown; findings below may reference images not displayed]

FINDINGS: CT HEAD FINDINGS

Brain: No evidence of malformation, atrophy, old or acute small or
large vessel infarction, mass lesion, hemorrhage, hydrocephalus or
extra-axial collection. No evidence of pituitary lesion.

Vascular: No vascular calcification.  No hyperdense vessels.

Skull: Normal.  No fracture or focal bone lesion.

Sinuses/Orbits: Visualized sinuses are clear. No fluid in the middle
ears or mastoids. Visualized orbits are normal.

Other: None significant

CTA NECK FINDINGS

Aortic arch: Normal. No atherosclerotic change, aneurysm or
dissection.

Right carotid system: Somewhat low opacity study. Common carotid
artery is normal. Carotid bifurcation is normal. Cervical ICA is
tortuous but otherwise normal.

Left carotid system: Common carotid artery widely patent to the
bifurcation. Low opacity exam. Carotid bifurcation appears normal.
Cervical ICA tortuous but otherwise normal.

Vertebral arteries: Proximal vertebral arteries not well seen
because of low contrast opacity and shoulder density. Higher in the
neck, both vertebral arteries show flow with patency to the foramen
magnum level.

Skeleton: Normal

Other neck: No mass or lymphadenopathy.

Upper chest: Normal

Review of the MIP images confirms the above findings

CTA HEAD FINDINGS

Anterior circulation: Both internal carotid arteries are patent
through the siphon regions. No sign of siphon atherosclerosis.
Anterior and middle cerebral vessels are normal without proximal
stenosis, aneurysm or vascular malformation.

Posterior circulation: Both vertebral arteries are patent to the
basilar with the left being dominant. No basilar stenosis. Posterior
circulation branch vessels appear normal.

Venous sinuses: Normal

Anatomic variants: None significant

Delayed phase: No abnormal enhancement

Review of the MIP images confirms the above findings
IMPRESSION: Negative CT angiography of the neck vessels. Contrast opacity is
fairly low due to bolus timing. There is no evidence of carotid
circulation dissection or atherosclerotic disease. Proximal
vertebral arteries are poorly seen because of technical limitation,
but beginning at the level of the mid neck, the posterior
circulation appears normal.

Normal appearance of the brain itself. No abnormality seen to
explain headache.

## 2019-05-17 DIAGNOSIS — U071 COVID-19: Secondary | ICD-10-CM

## 2019-05-17 HISTORY — DX: COVID-19: U07.1

## 2020-04-20 ENCOUNTER — Encounter: Payer: Self-pay | Admitting: *Deleted
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
## 2022-05-10 ENCOUNTER — Other Ambulatory Visit: Payer: Self-pay

## 2022-05-10 ENCOUNTER — Encounter (HOSPITAL_COMMUNITY): Payer: Self-pay | Admitting: Emergency Medicine

## 2022-05-10 ENCOUNTER — Inpatient Hospital Stay (HOSPITAL_COMMUNITY)
Admission: EM | Admit: 2022-05-10 | Discharge: 2022-05-15 | DRG: 286 | Disposition: A | Discharge status: Home / Self Care | Payer: BC Managed Care – PPO | Attending: Family Medicine | Admitting: Family Medicine

## 2022-05-10 ENCOUNTER — Inpatient Hospital Stay (HOSPITAL_COMMUNITY): Payer: BC Managed Care – PPO

## 2022-05-10 ENCOUNTER — Emergency Department (HOSPITAL_COMMUNITY): Payer: BC Managed Care – PPO

## 2022-05-10 DIAGNOSIS — Z83438 Family history of other disorder of lipoprotein metabolism and other lipidemia: Secondary | ICD-10-CM | POA: Diagnosis not present

## 2022-05-10 DIAGNOSIS — F1020 Alcohol dependence, uncomplicated: Secondary | ICD-10-CM | POA: Diagnosis present

## 2022-05-10 DIAGNOSIS — Z833 Family history of diabetes mellitus: Secondary | ICD-10-CM | POA: Diagnosis not present

## 2022-05-10 DIAGNOSIS — Z8616 Personal history of COVID-19: Secondary | ICD-10-CM | POA: Diagnosis not present

## 2022-05-10 DIAGNOSIS — I110 Hypertensive heart disease with heart failure: Principal | ICD-10-CM

## 2022-05-10 DIAGNOSIS — Z8249 Family history of ischemic heart disease and other diseases of the circulatory system: Secondary | ICD-10-CM | POA: Diagnosis not present

## 2022-05-10 DIAGNOSIS — R079 Chest pain, unspecified: Secondary | ICD-10-CM | POA: Diagnosis present

## 2022-05-10 DIAGNOSIS — Z720 Tobacco use: Secondary | ICD-10-CM | POA: Diagnosis not present

## 2022-05-10 DIAGNOSIS — I5043 Acute on chronic combined systolic (congestive) and diastolic (congestive) heart failure: Secondary | ICD-10-CM | POA: Diagnosis present

## 2022-05-10 DIAGNOSIS — I10 Essential (primary) hypertension: Secondary | ICD-10-CM | POA: Diagnosis not present

## 2022-05-10 DIAGNOSIS — Z20822 Contact with and (suspected) exposure to covid-19: Secondary | ICD-10-CM | POA: Diagnosis present

## 2022-05-10 DIAGNOSIS — Z888 Allergy status to other drugs, medicaments and biological substances status: Secondary | ICD-10-CM

## 2022-05-10 DIAGNOSIS — R0789 Other chest pain: Secondary | ICD-10-CM | POA: Diagnosis present

## 2022-05-10 DIAGNOSIS — Z823 Family history of stroke: Secondary | ICD-10-CM | POA: Diagnosis not present

## 2022-05-10 DIAGNOSIS — Z91148 Patient's other noncompliance with medication regimen for other reason: Secondary | ICD-10-CM | POA: Diagnosis not present

## 2022-05-10 DIAGNOSIS — Z800 Family history of malignant neoplasm of digestive organs: Secondary | ICD-10-CM

## 2022-05-10 DIAGNOSIS — I5020 Unspecified systolic (congestive) heart failure: Secondary | ICD-10-CM | POA: Diagnosis present

## 2022-05-10 DIAGNOSIS — E785 Hyperlipidemia, unspecified: Secondary | ICD-10-CM | POA: Diagnosis present

## 2022-05-10 DIAGNOSIS — F1910 Other psychoactive substance abuse, uncomplicated: Secondary | ICD-10-CM | POA: Diagnosis present

## 2022-05-10 DIAGNOSIS — Z6841 Body Mass Index (BMI) 40.0 and over, adult: Secondary | ICD-10-CM

## 2022-05-10 DIAGNOSIS — I160 Hypertensive urgency: Secondary | ICD-10-CM | POA: Diagnosis present

## 2022-05-10 DIAGNOSIS — R778 Other specified abnormalities of plasma proteins: Secondary | ICD-10-CM | POA: Diagnosis present

## 2022-05-10 DIAGNOSIS — Z79899 Other long term (current) drug therapy: Secondary | ICD-10-CM

## 2022-05-10 DIAGNOSIS — R0600 Dyspnea, unspecified: Principal | ICD-10-CM

## 2022-05-10 DIAGNOSIS — F17210 Nicotine dependence, cigarettes, uncomplicated: Secondary | ICD-10-CM | POA: Diagnosis present

## 2022-05-10 DIAGNOSIS — R739 Hyperglycemia, unspecified: Secondary | ICD-10-CM | POA: Diagnosis present

## 2022-05-10 DIAGNOSIS — E876 Hypokalemia: Secondary | ICD-10-CM | POA: Diagnosis present

## 2022-05-10 DIAGNOSIS — R0609 Other forms of dyspnea: Secondary | ICD-10-CM | POA: Diagnosis present

## 2022-05-10 DIAGNOSIS — I5021 Acute systolic (congestive) heart failure: Secondary | ICD-10-CM | POA: Diagnosis not present

## 2022-05-10 DIAGNOSIS — I509 Heart failure, unspecified: Secondary | ICD-10-CM | POA: Diagnosis not present

## 2022-05-10 HISTORY — DX: Snoring: R06.83

## 2022-05-10 HISTORY — DX: Morbid (severe) obesity due to excess calories: E66.01

## 2022-05-10 HISTORY — DX: Alcohol use, unspecified, uncomplicated: F10.90

## 2022-05-10 HISTORY — DX: Other symptoms and signs involving the nervous system: R29.818

## 2022-05-10 HISTORY — DX: Tobacco use: Z72.0

## 2022-05-10 LAB — CBC
HCT: 48.7 % (ref 39.0–52.0)
Hemoglobin: 16.5 g/dL (ref 13.0–17.0)
MCH: 28.6 pg (ref 26.0–34.0)
MCHC: 33.9 g/dL (ref 30.0–36.0)
MCV: 84.5 fL (ref 80.0–100.0)
Platelets: 270 10*3/uL (ref 150–400)
RBC: 5.76 MIL/uL (ref 4.22–5.81)
RDW: 14.5 % (ref 11.5–15.5)
WBC: 6.8 10*3/uL (ref 4.0–10.5)
nRBC: 0 % (ref 0.0–0.2)

## 2022-05-10 LAB — BASIC METABOLIC PANEL
Anion gap: 8 (ref 5–15)
BUN: 9 mg/dL (ref 6–20)
CO2: 26 mmol/L (ref 22–32)
Calcium: 8.9 mg/dL (ref 8.9–10.3)
Chloride: 105 mmol/L (ref 98–111)
Creatinine, Ser: 1.08 mg/dL (ref 0.61–1.24)
GFR, Estimated: >60 mL/min (ref >60)
Glucose, Bld: 146 mg/dL — ABNORMAL HIGH (ref 70–99)
Potassium: 3 mmol/L — ABNORMAL LOW (ref 3.5–5.1)
Sodium: 139 mmol/L (ref 135–145)

## 2022-05-10 LAB — BRAIN NATRIURETIC PEPTIDE: B Natriuretic Peptide: 536 pg/mL — ABNORMAL HIGH (ref 0.0–100.0)

## 2022-05-10 LAB — RAPID URINE DRUG SCREEN, HOSP PERFORMED
Amphetamines: NOT DETECTED
Barbiturates: NOT DETECTED
Benzodiazepines: NOT DETECTED
Cocaine: NOT DETECTED
Opiates: NOT DETECTED
Tetrahydrocannabinol: NOT DETECTED

## 2022-05-10 LAB — TROPONIN I (HIGH SENSITIVITY)
Troponin I (High Sensitivity): 63 ng/L — ABNORMAL HIGH (ref <18)
Troponin I (High Sensitivity): 70 ng/L — ABNORMAL HIGH (ref <18)

## 2022-05-10 LAB — SARS CORONAVIRUS 2 BY RT PCR: SARS Coronavirus 2 by RT PCR: NEGATIVE

## 2022-05-10 LAB — HIV ANTIBODY (ROUTINE TESTING W REFLEX): HIV Screen 4th Generation wRfx: NONREACTIVE

## 2022-05-10 LAB — MAGNESIUM: Magnesium: 1.9 mg/dL (ref 1.7–2.4)

## 2022-05-10 LAB — HEPATIC FUNCTION PANEL
ALT: 25 U/L (ref 0–44)
AST: 21 U/L (ref 15–41)
Albumin: 3.6 g/dL (ref 3.5–5.0)
Alkaline Phosphatase: 70 U/L (ref 38–126)
Bilirubin, Direct: 0.2 mg/dL (ref 0.0–0.2)
Indirect Bilirubin: 0.2 mg/dL — ABNORMAL LOW (ref 0.3–0.9)
Total Bilirubin: 0.4 mg/dL (ref 0.3–1.2)
Total Protein: 7 g/dL (ref 6.5–8.1)

## 2022-05-10 LAB — TSH: TSH: 0.643 u[IU]/mL (ref 0.350–4.500)

## 2022-05-10 MED ORDER — SODIUM CHLORIDE 0.9% FLUSH
3.0000 mL | Freq: Two times a day (BID) | INTRAVENOUS | Status: DC
  Administered 2022-05-10 – 2022-05-14 (×6): 3 mL via INTRAVENOUS

## 2022-05-10 MED ORDER — POTASSIUM CHLORIDE CRYS ER 20 MEQ PO TBCR
40.0000 meq | EXTENDED_RELEASE_TABLET | Freq: Every day | ORAL | Status: DC
  Administered 2022-05-11 – 2022-05-15 (×5): 40 meq via ORAL
  Filled 2022-05-10 (×5): qty 2

## 2022-05-10 MED ORDER — ASPIRIN 81 MG PO TBEC
81.0000 mg | DELAYED_RELEASE_TABLET | Freq: Every day | ORAL | Status: DC
  Administered 2022-05-11 – 2022-05-13 (×3): 81 mg via ORAL
  Filled 2022-05-10 (×3): qty 1

## 2022-05-10 MED ORDER — FUROSEMIDE 10 MG/ML IJ SOLN
40.0000 mg | Freq: Two times a day (BID) | INTRAMUSCULAR | Status: DC
  Administered 2022-05-10 – 2022-05-12 (×5): 40 mg via INTRAVENOUS
  Filled 2022-05-10 (×5): qty 4

## 2022-05-10 MED ORDER — ATORVASTATIN CALCIUM 40 MG PO TABS
40.0000 mg | ORAL_TABLET | Freq: Every day | ORAL | Status: DC
  Administered 2022-05-10 – 2022-05-14 (×5): 40 mg via ORAL
  Filled 2022-05-10 (×5): qty 1

## 2022-05-10 MED ORDER — IRBESARTAN 150 MG PO TABS
300.0000 mg | ORAL_TABLET | Freq: Every day | ORAL | Status: DC
  Administered 2022-05-10 – 2022-05-15 (×6): 300 mg via ORAL
  Filled 2022-05-10 (×5): qty 2
  Filled 2022-05-10: qty 1
  Filled 2022-05-10: qty 2

## 2022-05-10 MED ORDER — SODIUM CHLORIDE 0.9% FLUSH
3.0000 mL | INTRAVENOUS | Status: DC | PRN
Start: 2022-05-10 — End: 2022-05-15

## 2022-05-10 MED ORDER — SODIUM CHLORIDE 0.9 % IV SOLN: 250.0000 mL | INTRAVENOUS | Status: DC | PRN

## 2022-05-10 MED ORDER — ASPIRIN 325 MG PO TABS
325.0000 mg | ORAL_TABLET | Freq: Every day | ORAL | Status: DC
  Administered 2022-05-10: 325 mg via ORAL
  Filled 2022-05-10: qty 1

## 2022-05-10 MED ORDER — ONDANSETRON HCL 4 MG/2ML IJ SOLN: 4.0000 mg | Freq: Four times a day (QID) | INTRAMUSCULAR | Status: DC | PRN

## 2022-05-10 MED ORDER — NITROGLYCERIN 0.4 MG SL SUBL: 0.4000 mg | SUBLINGUAL_TABLET | SUBLINGUAL | Status: DC | PRN

## 2022-05-10 MED ORDER — HYDRALAZINE HCL 20 MG/ML IJ SOLN
5.0000 mg | Freq: Once | INTRAMUSCULAR | Status: AC
  Administered 2022-05-10: 5 mg via INTRAVENOUS
  Filled 2022-05-10: qty 1

## 2022-05-10 MED ORDER — ENOXAPARIN SODIUM 80 MG/0.8ML IJ SOSY
80.0000 mg | PREFILLED_SYRINGE | INTRAMUSCULAR | Status: DC
  Administered 2022-05-12 – 2022-05-13 (×2): 80 mg via SUBCUTANEOUS
  Filled 2022-05-10 (×4): qty 0.8

## 2022-05-10 MED ORDER — POTASSIUM CHLORIDE CRYS ER 20 MEQ PO TBCR
40.0000 meq | EXTENDED_RELEASE_TABLET | Freq: Once | ORAL | Status: AC
  Administered 2022-05-10: 40 meq via ORAL
  Filled 2022-05-10: qty 2

## 2022-05-10 MED ORDER — MAGNESIUM SULFATE IN D5W 1-5 GM/100ML-% IV SOLN
1.0000 g | Freq: Once | INTRAVENOUS | Status: AC
  Administered 2022-05-10: 1 g via INTRAVENOUS
  Filled 2022-05-10: qty 100

## 2022-05-10 MED ORDER — HYDRALAZINE HCL 20 MG/ML IJ SOLN: 5.0000 mg | INTRAMUSCULAR | Status: DC | PRN

## 2022-05-10 MED ORDER — ACETAMINOPHEN 325 MG PO TABS
650.0000 mg | ORAL_TABLET | ORAL | Status: DC | PRN
  Administered 2022-05-10 – 2022-05-12 (×2): 650 mg via ORAL
  Filled 2022-05-10 (×2): qty 2

## 2022-05-10 MED ORDER — CARVEDILOL 3.125 MG PO TABS: 3.1250 mg | ORAL_TABLET | Freq: Two times a day (BID) | ORAL | Status: DC

## 2022-05-10 NOTE — ED Triage Notes (Signed)
Patient seen by PCP this morning for shortness of breath and exertional chest tightness that he noticed "a couple days ago". Pt also reports persistent cough as well. PCP referred him here for HTN. Patient Aox4. NAD at this time. BP at PCP office was 168/113

## 2022-05-10 NOTE — ED Provider Notes (Signed)
[facility] EMERGENCY DEPARTMENT Provider Note   CSN: [id number] Arrival date & time: 05/10/22  6222     History {Add pertinent medical, surgical, social history, OB history to HPI:1} Chief Complaint  Patient presents with   Shortness of Breath   Hypertension    Henry Chandler is a 41 y.o. male.  Patient states that he has a history of hypertension but has not been taking his medicine.  For the last week he has been having chest pain and shortness of breath on exertion.   Shortness of Breath Hypertension Associated symptoms include shortness of breath.      Home Medications Prior to Admission medications   Medication Sig Start Date End Date Taking? Authorizing Provider  acetaminophen (TYLENOL) 325 MG tablet Take 650 mg by mouth every 6 (six) hours as needed for mild pain.   Yes [provider]  irbesartan-hydrochlorothiazide (AVALIDE) 300-12.5 MG tablet Take 1 tablet by mouth daily. Patient not taking: Reported on 05/10/2022 08/26/18   Arnetha Courser, MD      Allergies    Lisinopril    Review of Systems   Review of Systems  Respiratory:  Positive for shortness of breath.    Physical Exam Updated Vital Signs BP (!) 149/105   Pulse 99   Temp 98.4 F (36.9 C) (Oral)   Resp (!) 29   Ht 6\' 1"  (1.854 m)   Wt (!) 157 kg   SpO2 99%   BMI 45.67 kg/m  Physical Exam  ED Results / Procedures / Treatments   Labs (all labs ordered are listed, but only abnormal results are displayed) Labs Reviewed  BASIC METABOLIC PANEL - Abnormal; Notable for the following components:      Result Value   Potassium 3.0 (*)    Glucose, Bld 146 (*)    All other components within normal limits  HEPATIC FUNCTION PANEL - Abnormal; Notable for the following components:   Indirect Bilirubin 0.2 (*)    All other components within normal limits  BRAIN NATRIURETIC PEPTIDE - Abnormal; Notable for the following components:   B Natriuretic Peptide 536.0 (*)    All other  components within normal limits  TROPONIN I (HIGH SENSITIVITY) - Abnormal; Notable for the following components:   Troponin I (High Sensitivity) 63 (*)    All other components within normal limits  TROPONIN I (HIGH SENSITIVITY) - Abnormal; Notable for the following components:   Troponin I (High Sensitivity) 70 (*)    All other components within normal limits  SARS CORONAVIRUS 2 BY RT PCR  CBC  RAPID URINE DRUG SCREEN, HOSP PERFORMED  MAGNESIUM  TSH    EKG EKG Interpretation  Date/Time:  Friday May 10 2022 11:21:51 EDT Ventricular Rate:  97 PR Interval:  150 QRS Duration: 108 QT Interval:  401 QTC Calculation: 510 R Axis:   64 Text Interpretation: Sinus rhythm Abnormal T, consider ischemia, diffuse leads Prolonged QT interval Confirmed by 1970 [phone]) on 05/10/2022 12:00:59 PM  Radiology DG Chest 2 View  Result Date: 05/10/2022 CLINICAL DATA:  Shortness of breath beginning couple weeks ago, coughing for months, chest pain since last week, history hypertension EXAM: CHEST - 2 VIEW COMPARISON:  222 20,019 FINDINGS: Enlargement of cardiac silhouette. Mediastinal contours and pulmonary vascularity normal. RIGHT basilar interstitial infiltrate. Remaining lungs clear. No pleural effusion or pneumothorax. IMPRESSION: Enlargement of cardiac silhouette. RIGHT basilar is tissue infiltrate question pneumonia. Electronically Signed   By: 04-01-1979 M.D.   On: 05/10/2022 10:38  Procedures Procedures  {Document cardiac monitor, telemetry assessment procedure when appropriate:1}  Medications Ordered in ED Medications  aspirin EC tablet 81 mg (has no administration in time range)  potassium chloride SA (KLOR-CON M) CR tablet 40 mEq (has no administration in time range)    Followed by  potassium chloride SA (KLOR-CON M) CR tablet 40 mEq (has no administration in time range)  irbesartan (AVAPRO) tablet 300 mg (has no administration in time range)  furosemide (LASIX) injection 40  mg (has no administration in time range)  hydrALAZINE (APRESOLINE) injection 5 mg (5 mg Intravenous Given 05/10/22 1138)  potassium chloride SA (KLOR-CON M) CR tablet 40 mEq (40 mEq Oral Given 05/10/22 1412)    ED Course/ Medical Decision Making/ A&P  CRITICAL CARE Performed by: Bethann Berkshire Total critical care time: 40 minutes Critical care time was exclusive of separately billable procedures and treating other patients. Critical care was necessary to treat or prevent imminent or life-threatening deterioration. Critical care was time spent personally by me on the following activities: development of treatment plan with patient and/or surrogate as well as nursing, discussions with consultants, evaluation of patient's response to treatment, examination of patient, obtaining history from patient or surrogate, ordering and performing treatments and interventions, ordering and review of laboratory studies, ordering and review of radiographic studies, pulse oximetry and re-evaluation of patient's condition.   Patient seen by cardiology.  They will get an echo and consult on the patient.  They requested medicine admission                         Medical Decision Making Amount and/or Complexity of Data Reviewed Labs: ordered. Radiology: ordered.  Risk Prescription drug management.   Dyspnea on exertion possible unstable angina.  Patient also with poorly controlled hypertension he will be admitted to medicine with cardiology consult  {Document critical care time when appropriate:1} {Document review of labs and clinical decision tools ie heart score, Chads2Vasc2 etc:1}  {Document your independent review of radiology images, and any outside records:1} {Document your discussion with family members, caretakers, and with consultants:1} {Document social determinants of health affecting pt's care:1} {Document your decision making why or why not admission, treatments were needed:1} Final Clinical  Impression(s) / ED Diagnoses Final diagnoses:  Dyspnea, unspecified type  Hypertension, unspecified type    Rx / DC Orders ED Discharge Orders     None

## 2022-05-10 NOTE — Progress Notes (Addendum)
Mag has not resulted yet. Placed order for staff to call lab to see if they can add on the level we ordered around 1:30pm since it is not in process yet - requested that if they are unable to add on, for them to place order for ASAP level and notify cardiology when this results. Update: Mg level resulted 1.9, will give 1g mag sulfate with f/u in AM.

## 2022-05-10 NOTE — Consult Note (Addendum)
Cardiology Consultation:   Patient ID: Henry Chandler MRN: [id number]; DOB: September 26, 1981  Admit date: 05/10/2022 Date of Consult: 05/10/2022  PCP:  Oneita Hurt, No   [facility] Providers Cardiologist:  New to Dr. Lesly Dukes here to update MD or APP on Care Team, Refresh:1}     Patient Profile:   Henry Chandler is a 41 y.o. male with a hx of uncontrolled HTN (dx around age 20), Covid PNA with associated respiratory failure requiring [place] O2 05/2019 (tx at OSH, c/b rhabdomyolysis and AKI), snoring, morbid obesity, tobacco abuse, habitual alcohol use (10-15 shots/week), family history of heart disease who is being seen 05/10/2022 for the evaluation of elevated troponin at the request of Dr. Estell Harpin.  History of Present Illness:   Mr. Tatge previously saw [facility] in 11/2019 for evaluation of resistant HTN in setting of multiple medications. This was diagnosed around age 71. Sleep study was ordered and carvedilol was added. 2D echo was obtained 12/2019 showing EF 55-60%, borderline LVH, diastolic function and filling pressure normal, no significant valve abnormalities. He did not follow up after that encounter and did not complete his sleep study. He was lost to follow-up since then and had not been taking his blood pressure medicines for some time. He states he "got lazy and just stopped picking them up" and other times did not have the funds to do so.    He presented to his primary care today with complaints of chest pain, cough, and exertional dyspnea and was found to have BP 168/110, pulse ox 93% RA, therefore transferred to ER for further evaluation. He reports he's had a dry cough for several weeks accompanied by DOE. 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 He also had tingling in the back of his throat at times. The last 4 days he's begun to notice exertional chest tightness when walking around. There is some tightness in his back as well. It  completely resolves when he rests. He has not had any rest pain. He reports a long history of periodically waking up in the middle of the night gasping which he thought was related to OSA though has been happening more frequently recently. No edema, palpitations or syncope. He reports ongoing smoking. He drinks 10-15 shots per week. He denies any illicit drug use aside from marijuana. His sister had a heart transplant in her 30s for unclear reason following pneumonia and his mother may have had a stent, further details unknown. He denies any active chest pain. He reports some dyspnea just talking or moving about in bed. He received 325mg  ASA and 5mg  IV hydralazine.  Labs notable for K 3.0, glucose 146, Cr 1.08, CBC wnl, BNP 536, hsTroponin 63-70. CXR shows enlargement of the cardiac silhouette with right basilar tissue infiltrate, question pneumonia. Normal mediastinal contours noted. EKG shows NSR 99bpm, occasional PVCs, nonspecific STTW changes, prolonged QTC . Repeat tracing similar. These changes are new from 2018.     Past Medical History:  Diagnosis Date   Back pain    Habitual alcohol use    Hypertension    Morbid obesity (HCC)    Pneumonia due to COVID-19 virus 05/2019   Snoring    Suspected sleep apnea    Tobacco abuse     History reviewed. No pertinent surgical history.   Home Medications:  Prior to Admission medications   Medication Sig Start Date End Date Taking? Authorizing Provider  acetaminophen (TYLENOL) 325 MG tablet  Take 650 mg by mouth every 6 (six) hours as needed for mild pain.   Yes [provider]  irbesartan-hydrochlorothiazide (AVALIDE) 300-12.5 MG tablet Take 1 tablet by mouth daily. Patient not taking: Reported on 05/10/2022 08/26/18   Arnetha Courser, MD    Inpatient Medications: Scheduled Meds:  aspirin  325 mg Oral Daily   potassium chloride  40 mEq Oral Once   Continuous Infusions:  PRN Meds:   Allergies:    Allergies  Allergen  Reactions   Lisinopril Swelling and Anaphylaxis    cough    Social History:   Social History   Socioeconomic History   Marital status: Married    Spouse name: Not on file   Number of children: Not on file   Years of education: GED   Highest education level: Not on file  Occupational History   Occupation: Occupational psychologist: [employer]  Tobacco Use   Smoking status: Every Day    Packs/day: 0.25    Years: 10.00    Pack years: 2.50    Types: Cigarettes   Smokeless tobacco: Never   Tobacco comments:    less than .5 PPD  Vaping Use   Vaping Use: Never used  Substance and Sexual Activity   Alcohol use: Yes    Alcohol/week: 10.0 - 15.0 standard drinks    Types: 10 - 15 Shots of liquor per week   Drug use: Yes    Types: Marijuana   Sexual activity: Yes    Birth control/protection: Pill  Other Topics Concern   Not on file  Social History Narrative   Regular exercise-yes   Caffeine Use-yes   Social Determinants of Health   Financial Resource Strain: Not on file  Food Insecurity: Not on file  Transportation Needs: Not on file  Physical Activity: Not on file  Stress: Not on file  Social Connections: Not on file  Intimate Partner Violence: Not on file    Family History:    Family History  Problem Relation Age of Onset   Hypertension Mother    Cancer Mother        pancreas   Hyperlipidemia Mother    Heart disease Mother        ? stent? unclear details   Hypertension Father    Hyperlipidemia Father    Heart disease Sister        Had PNA -> had to have heart transplant   Diabetes Neg Hx    Stroke Neg Hx      ROS:  Please see the history of present illness.  All other ROS reviewed and negative.     Physical Exam/Data:   Vitals:   05/10/22 1130 05/10/22 1200 05/10/22 1300 05/10/22 1400  BP: (!) 159/109 (!) 136/95 (!) 142/93 (!) 149/105  Pulse: 95 99 97 99  Resp: (!) 30 (!) 29 19 (!) 29  Temp:      TempSrc:      SpO2: 98% 99% 96% 99%  Weight:       Height:       No intake or output data in the 24 hours ending 05/10/22 1409    05/10/2022   10:15 AM 08/26/2018    2:09 PM 07/28/2018    8:57 AM  Last 3 Weights  Weight (lbs) 346 lb 2 oz 348 lb 350 lb  Weight (kg) 157 kg 157.852 kg 158.759 kg     Body mass index is 45.67 kg/m.  General: Well developed, well nourished,  in no acute distress. Body habitus c/w morbid obesity. Head: Normocephalic, atraumatic, sclera non-icteric, no xanthomas, nares are without discharge. Neck: Negative for carotid bruits. JVP not elevated. Lungs: Clear bilaterally to auscultation without wheezes, rales, or rhonchi. Breathing is unlabored. Heart: RRR S1 S2, borderline elevated HR, without murmurs, rubs, or gallops.  Abdomen: Soft, non-tender, non-distended with normoactive bowel sounds. No rebound/guarding. Extremities: No clubbing or cyanosis. No edema. Distal pedal pulses are 2+ and equal bilaterally. Neuro: Alert and oriented X 3. Moves all extremities spontaneously. Psych:  Responds to questions appropriately with a normal affect.   EKG:  The EKG was personally reviewed and demonstrates:  EKG shows NSR 99bpm, occasional PVCs, nonspecific STTW changes, prolonged QTC . Repeat tracing similar. These changes are new from 2018.  Telemetry:  Telemetry was personally reviewed and demonstrates:  NSR/ST occasional PVCs  Relevant CV Studies: Echo 12/2019 Left Ventricle  Normal left ventricle size. There is borderline concentric hypertrophy. EF: 55-60%. ; GLS = -18.9% from the apical 4,3,2 chamber views respectively. Wall motion is within normal limits. Diastolic function and filling pressure is normal.   Right Ventricle  Right ventricle cavity appears normal. Systolic function is normal.   Left Atrium  Left atrium cavity size is at upper limit of normal.   Right Atrium  Normal sized right atrium.   IVC/SVC  The inferior vena cava demonstrates a diameter of <=21 mm and collapses >50%; therefore,  the right atrial pressure is estimated at 0-5 mmHg.   Mitral Valve  Mitral valve structure is normal. The leaflets are not thickened and exhibit normal excursion. There is trace regurgitation. There is no evidence of mitral valve stenosis.   Tricuspid Valve  Tricuspid valve structure is normal. The leaflets are not thickened and exhibit normal excursion. There is trace regurgitation. TR jet velocity 1.8 m/s. There is no evidence of tricuspid valve stenosis. The right ventricular systolic pressure is normal (<36 mmHg).   Aortic Valve  Normal tricuspid aortic valve. The leaflets are not thickened and exhibit normal excursion. There is no regurgitation or stenosis.   Pulmonic Valve  The pulmonic valve was not well visualized. Grossly normal valve structure. Pulmonic valve with normal excursion. No regurgitation present on the pulmonic valveNo pulmonic stenosis present.   Ascending Aorta  The aorta appears normal in size.   Pericardium  There is no pericardial effusion.    Laboratory Data:  High Sensitivity Troponin:   Recent Labs  Lab 05/10/22 1022 05/10/22 1219  TROPONINIHS 63* 70*     Chemistry Recent Labs  Lab 05/10/22 1022  NA 139  K 3.0*  CL 105  CO2 26  GLUCOSE 146*  BUN 9  CREATININE 1.08  CALCIUM 8.9  GFRNONAA >60  ANIONGAP 8    Recent Labs  Lab 05/10/22 1219  PROT 7.0  ALBUMIN 3.6  AST 21  ALT 25  ALKPHOS 70  BILITOT 0.4   Lipids No results for input(s): CHOL, TRIG, HDL, LABVLDL, LDLCALC, CHOLHDL in the last 168 hours.  Hematology Recent Labs  Lab 05/10/22 1022  WBC 6.8  RBC 5.76  HGB 16.5  HCT 48.7  MCV 84.5  MCH 28.6  MCHC 33.9  RDW 14.5  PLT 270   Thyroid No results for input(s): TSH, FREET4 in the last 168 hours.  BNP Recent Labs  Lab 05/10/22 1022  BNP 536.0*    DDimer No results for input(s): DDIMER in the last 168 hours.   Radiology/Studies:  DG Chest 2 View  Result Date:  05/10/2022 CLINICAL DATA:  Shortness of breath  beginning couple weeks ago, coughing for months, chest pain since last week, history hypertension EXAM: CHEST - 2 VIEW COMPARISON:  222 20,019 FINDINGS: Enlargement of cardiac silhouette. Mediastinal contours and pulmonary vascularity normal. RIGHT basilar interstitial infiltrate. Remaining lungs clear. No pleural effusion or pneumothorax. IMPRESSION: Enlargement of cardiac silhouette. RIGHT basilar is tissue infiltrate question pneumonia. Electronically Signed   By: Ulyses Southward M.D.   On: 05/10/2022 10:38     Assessment and Plan:   1. Shortness of breath, cough, exertional chest tightness, here with elevated troponin/BNP and abnormal CXR - symptoms sound anginal in nature though may very well be potentially driven by underlying uncontrolled HTN with component of heart failure. CXR shows normal mediastinal contours and pulses equal bilaterally. Plan discussed with MD - plan to add IV Lasix 40mg  BID (first dose ordered for after repletion of potassium) - add back home irbesartan - hold off Entresto due to h/o swelling with lisinopril - anticipate adding carvedilol in AM if UDS negative and BP tolerates med addition above - given low/flat troponin, would hold off full dose heparin at this time. This is felt more reflective of demand process rather than ACS. If he has recurrent CP, this can be revisited - will decrease ASA dose to 81mg  daily to start tomorrow (got 325mg  today) - plan echocardiogram - clinical picture/objective findings are concerning for development of cardiomyopathy. Will need to base further ischemic workup decisions based on echo findings and clinical response to diuresis/BP management - will defer mgmt of abnormal CXR to IM, unclear if there is an actual component of PNA - check Covid swab for completeness given cough, dyspnea  2. Hypertensive urgency, nonadherence with outpatient regimen - med plan as above - check thyroid with labs - not clear if this truly represents resistant  HTN since compliance will be key  3. Occasional PVCs with prolonged QT interval - K 3.0, repletion ordered - add Mg, recommend to keep goal of 2.0 or greater - recommend primary team order daily labs with admission - avoid QT prolonging agents  4. Polysubstance abuse with tobacco, ETOH, THC - cessation advised - will defer CIWA to primary team - UDS for standard completeness  5. Hypokalemia - KCl x 2 ordered  6. Morbid obesity with suspected OSA - will need lifestyle modification and sleep study as outpatient    Risk Assessment/Risk Scores:     TIMI Risk Score for Unstable Angina or Non-ST Elevation MI:   The patient's TIMI risk score is 3, which indicates a 13% risk of all cause mortality, new or recurrent myocardial infarction or need for urgent revascularization in the next 14 days.  New York Heart Association (NYHA) Functional Class NYHA Class III   For questions or updates, please contact [facility] Please consult [email or web address] for contact info under    Signed, , PA-C  05/10/2022 2:09 PM  Personally seen and examined. Agree with above.  Currently comfortable.  Normal respiratory effort.  No chest pain while resting.  Troponin elevation likely secondary to myocardial injury in the setting of hypertensive urgency.  Chest x-ray concerning for increased heart size/cardiomegaly.  We will check an echocardiogram.  He certainly could have an underlying cardiomyopathy which would be at age 64 with severe hypertension, likely nonischemic. - We will go ahead and start him on his irbesartan once again 300 mg.  I asked him about lisinopril.  He did state that he had tongue  swelling and neck swelling.  This negates Entresto in the future.  He has tolerated his irbesartan in the past.  There is a small cross-reactivity and he is never experienced angioedema with this ARB. -We will also give him IV Lasix.  Replete potassium.  Spironolactone may be helpful in the  future.  His low potassium may be a marker for hyperaldosteronism.  Donato Schultz, MD

## 2022-05-10 NOTE — H&P (Signed)
History and Physical    Patient: Henry Chandler:[id number] DOB: 1981-07-28 DOA: 05/10/2022 DOS: the patient was seen and examined on 05/10/2022 PCP: Maud Deed, [facility] Patient coming from: Home - lives alone; NOK: Mother, [phone]   Chief Complaint: SOB, CP  HPI: Henry Chandler is a 41 y.o. male with medical history significant of back pain; ETOH dependence; HTN; morbid obesity; and tobacco dependence presenting with SOB and CP.  About a month ago, he started with cough.  SOB a few weeks ago.  Earlier this week, he would have chest tightness with the SOB with radiation into the back.  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  It recurs every time he exerts himself.  No BP meds for the last few months or so, takes one pill periodically but not regularly.  No current pain now.  No h/o stress test, heart cath.      ER Course:  h/o HTN, not compliant with meds.  Physical labor, CP and SOB with exertion, relieved with rest.  PCP found inverted t waves, mild troponin elevation.    Review of Systems: As mentioned in the history of present illness. All other systems reviewed and are negative. Past Medical History:  Diagnosis Date   Back pain    Habitual alcohol use    Hypertension    Morbid obesity (HCC)    Pneumonia due to COVID-19 virus 05/2019   Snoring    Suspected sleep apnea    Tobacco abuse    History reviewed. No pertinent surgical history. Social History:  reports that he has been smoking cigarettes. He has a 4.25 pack-year smoking history. He has never used smokeless tobacco. He reports current alcohol use of about 10.0 - 15.0 standard drinks per week. He reports current drug use. Drug: Marijuana.  Allergies  Allergen Reactions   Lisinopril Anaphylaxis and Swelling    Swelling of tongue    Family History  Problem Relation Age of Onset   Hypertension Mother    Cancer Mother         pancreas   Hyperlipidemia Mother    Heart disease Mother        ? stent? unclear details   Hypertension Father    Hyperlipidemia Father    Heart disease Sister        Had PNA -> had to have heart transplant   Diabetes Neg Hx    Stroke Neg Hx     Prior to Admission medications   Medication Sig Start Date End Date Taking? Authorizing Provider  acetaminophen (TYLENOL) 325 MG tablet Take 650 mg by mouth every 6 (six) hours as needed for mild pain.   Yes [provider]  irbesartan-hydrochlorothiazide (AVALIDE) 300-12.5 MG tablet Take 1 tablet by mouth daily. Patient not taking: Reported on 05/10/2022 08/26/18   Arnetha Courser, MD    Physical Exam: Vitals:   05/10/22 1130 05/10/22 1200 05/10/22 1300 05/10/22 1400  BP: (!) 159/109 (!) 136/95 (!) 142/93 (!) 149/105  Pulse: 95 99 97 99  Resp: (!) 30 (!) 29 19 (!) 29  Temp:      TempSrc:      SpO2: 98% 99% 96% 99%  Weight:      Height:       General:  Appears calm and comfortable and is in NAD Eyes:  PERRL, EOMI, normal lids, iris ENT:  grossly normal hearing, lips &  tongue, mmm; suboptimal dentition Neck:  no LAD, masses or thyromegaly Cardiovascular:  RRR, no m/r/g. No LE edema.  Respiratory:   CTA bilaterally with no wheezes/rales/rhonchi.  Normal respiratory effort. Abdomen:  soft, NT, ND Skin:  no rash or induration seen on limited exam Musculoskeletal:  grossly normal tone BUE/BLE, good ROM, no bony abnormality Psychiatric:  grossly normal mood and affect, speech fluent and appropriate, AOx3 Neurologic:  CN 2-12 grossly intact, moves all extremities in coordinated fashion   Radiological Exams on Admission: Independently reviewed - see discussion in A/P where applicable  DG Chest 2 View  Result Date: 05/10/2022 CLINICAL DATA:  Shortness of breath beginning couple weeks ago, coughing for months, chest pain since last week, history hypertension EXAM: CHEST - 2 VIEW COMPARISON:  222 20,019 FINDINGS: Enlargement  of cardiac silhouette. Mediastinal contours and pulmonary vascularity normal. RIGHT basilar interstitial infiltrate. Remaining lungs clear. No pleural effusion or pneumothorax. IMPRESSION: Enlargement of cardiac silhouette. RIGHT basilar is tissue infiltrate question pneumonia. Electronically Signed   By: Ulyses Southward M.D.   On: 05/10/2022 10:38    EKG: Independently reviewed.  NSR with rate 99; prolonged QTc 508; nonspecific ST changes concerning for ischemia   Labs on Admission: I have personally reviewed the available labs and imaging studies at the time of the admission.  Pertinent labs:    K+ 3.0 Glucose 146 BNP 536 HS troponin 63, 70 Normal CBC   Assessment and Plan: Principal Problem:   Chest pain of uncertain etiology Active Problems:   Essential hypertension   Hyperglycemia   Tobacco use   Class 3 severe obesity due to excess calories with serious comorbidity and body mass index (BMI) of 40.0 to 44.9 in adult [facility])   History of medication noncompliance   Polysubstance abuse (HCC)   Chest pain of uncertain etiology -Patient with substernal chest pressure with SOB on exertion for the last week -Relieved with rest -3/3 typical symptoms suggestive of typical chest pain.  -This is in the setting of medication noncompliance, no HTN medications for several months -CXR unremarkable.   -Initial cardiac HS troponin negative.  -EKG with concern for ischemia.   -Will admit since the patient has positive troponins and/or an abnormal EKG with angina necessitating acute intervention. -Repeat EKG in AM -Start ASA 81 mg daily -Risk factor stratification with HgbA1c and FLP; will also check TSH and UDS -Cardiology consultation -NPO after MN for possible stress test  -Will order echo  HTN -Has not been taking Avalide -Avapro resumed -Will also consider adding low-dose carvedilol soon -Will also add prn hydralazine  HLD -Start empiric Lipitor  Medication  noncompliance -Glucose 152 -Diet controlled -Last A1c was 5.2 -There is no indication to start medication at this time -Will cover with moderate-scale SSI for now  Polysubstance abuse -Acknowledges binge drinking on weekends and periodic THC use -Cessation encouraged; this should be encouraged on an ongoing basis -UDS ordered   Tobacco dependence -Encourage cessation.   -This was discussed with the patient and should be reviewed on an ongoing basis.   -Patch declined by patient  Obesity -Body mass index is 45.67 kg/m..  -Weight loss should be encouraged -Outpatient PCP/bariatric medicine/bariatric surgery f/u encouraged    Advance Care Planning:   Code Status: Full Code   Consults: Cardiology  DVT Prophylaxis: Lovenox  Family Communication: None present; he did not request that I contact family at the time of admission  Severity of Illness: The appropriate patient status for this patient is INPATIENT.  Inpatient status is judged to be reasonable and necessary in order to provide the required intensity of service to ensure the patient's safety. The patient's presenting symptoms, physical exam findings, and initial radiographic and laboratory data in the context of their chronic comorbidities is felt to place them at high risk for further clinical deterioration. Furthermore, it is not anticipated that the patient will be medically stable for discharge from the hospital within 2 midnights of admission.   * I certify that at the point of admission it is my clinical judgment that the patient will require inpatient hospital care spanning beyond 2 midnights from the point of admission due to high intensity of service, high risk for further deterioration and high frequency of surveillance required.*  Author: Jonah Blue, MD 05/10/2022 3:38 PM  For on call review [email or web address].

## 2022-05-10 NOTE — ED Notes (Signed)
Patient transported to x-ray. ?

## 2022-05-11 DIAGNOSIS — I5021 Acute systolic (congestive) heart failure: Secondary | ICD-10-CM

## 2022-05-11 DIAGNOSIS — Z91148 Patient's other noncompliance with medication regimen for other reason: Secondary | ICD-10-CM | POA: Diagnosis not present

## 2022-05-11 DIAGNOSIS — F1910 Other psychoactive substance abuse, uncomplicated: Secondary | ICD-10-CM

## 2022-05-11 DIAGNOSIS — R079 Chest pain, unspecified: Secondary | ICD-10-CM | POA: Diagnosis not present

## 2022-05-11 DIAGNOSIS — Z6841 Body Mass Index (BMI) 40.0 and over, adult: Secondary | ICD-10-CM

## 2022-05-11 LAB — CBC
HCT: 48 % (ref 39.0–52.0)
Hemoglobin: 16.1 g/dL (ref 13.0–17.0)
MCH: 28.3 pg (ref 26.0–34.0)
MCHC: 33.5 g/dL (ref 30.0–36.0)
MCV: 84.4 fL (ref 80.0–100.0)
Platelets: 245 10*3/uL (ref 150–400)
RBC: 5.69 MIL/uL (ref 4.22–5.81)
RDW: 14.6 % (ref 11.5–15.5)
WBC: 7.6 10*3/uL (ref 4.0–10.5)
nRBC: 0 % (ref 0.0–0.2)

## 2022-05-11 LAB — ECHOCARDIOGRAM COMPLETE
AR max vel: 4.3 cm2
AV Peak grad: 5.9 mmHg
Ao pk vel: 1.21 m/s
Area-P 1/2: 6.48 cm2
Height: 73 in
S' Lateral: 6.2 cm
Weight: 5537.96 oz

## 2022-05-11 LAB — BASIC METABOLIC PANEL
Anion gap: 8 (ref 5–15)
BUN: 10 mg/dL (ref 6–20)
CO2: 26 mmol/L (ref 22–32)
Calcium: 8.8 mg/dL — ABNORMAL LOW (ref 8.9–10.3)
Chloride: 103 mmol/L (ref 98–111)
Creatinine, Ser: 1.1 mg/dL (ref 0.61–1.24)
GFR, Estimated: >60 mL/min (ref >60)
Glucose, Bld: 110 mg/dL — ABNORMAL HIGH (ref 70–99)
Potassium: 3.1 mmol/L — ABNORMAL LOW (ref 3.5–5.1)
Sodium: 137 mmol/L (ref 135–145)

## 2022-05-11 LAB — HEMOGLOBIN A1C
Hgb A1c MFr Bld: 5.6 % (ref 4.8–5.6)
Mean Plasma Glucose: 114.02 mg/dL

## 2022-05-11 LAB — LIPID PANEL
Cholesterol: 130 mg/dL (ref 0–200)
HDL: 31 mg/dL — ABNORMAL LOW (ref >40)
LDL Cholesterol: 79 mg/dL (ref 0–99)
Total CHOL/HDL Ratio: 4.2 RATIO
Triglycerides: 102 mg/dL (ref <150)
VLDL: 20 mg/dL (ref 0–40)

## 2022-05-11 LAB — MAGNESIUM: Magnesium: 2 mg/dL (ref 1.7–2.4)

## 2022-05-11 MED ORDER — POTASSIUM CHLORIDE 20 MEQ PO PACK
40.0000 meq | PACK | Freq: Once | ORAL | Status: AC
Start: 2022-05-11 — End: 2022-05-11
  Administered 2022-05-11: 40 meq via ORAL
  Filled 2022-05-11: qty 2

## 2022-05-11 MED ORDER — SPIRONOLACTONE 25 MG PO TABS
25.0000 mg | ORAL_TABLET | Freq: Every day | ORAL | Status: DC
  Administered 2022-05-11 – 2022-05-13 (×3): 25 mg via ORAL
  Filled 2022-05-11 (×3): qty 1

## 2022-05-11 NOTE — Progress Notes (Signed)
PROGRESS NOTE    Henry Chandler  [id number] DOB: 1981-03-22 DOA: 05/10/2022 PCP: Pcp, No   Brief Narrative:  This 41 years old male with PMH significant for back pain, EtOH dependence, hypertension, morbid obesity and tobacco dependence presented in the ED with c/o: chest pain and shortness of breath. Patient reports having cough for about a month ago and for last 1 week he has developed shortness of breath.  He also noticed some chest tightness that radiates towards the back.  Patient has been using his regular inhalers without any relief.  Pain is associated with exertion.  Patient has been noncompliant with his medications. ED work-up shows elevated troponins.  Cardiology is consulted planning for ischemic work-up next week.  Assessment & Plan:   Principal Problem:   Chest pain of uncertain etiology Active Problems:   Essential hypertension   Hyperglycemia   Tobacco use   Class 3 severe obesity due to excess calories with serious comorbidity and body mass index (BMI) of 40.0 to 44.9 in adult Henry Chandler)   History of medication noncompliance   Polysubstance abuse (Henry Chandler)   Chest pain: Suspected ACS. Patient presented with substernal chest pain, shortness of breath for 1 week.  Pain worsens with exertion gets better with rest.  Appears like angina. This is in the setting of medication noncompliance, not on any antihypertensive medication. EKG abnormal ST-T wave changes, troponin 63> 70, BNP 536. Continue aspirin, statins. Cardiology consulted recommended obtain 2D echocardiogram. Echocardiogram shows EF 20 to 25% consistent with cardiomyopathy. Cardiology recommended ischemic work-up. Patient will have right and left heart cath next week.  Essential hypertension: Continue Avapro, hydralazine as needed. Continue to monitor blood pressure  Hyperlipidemia: Continue Lipitor  Polysubstance abuse: Patient acknowledges being drinking on weekends and periodic THC use Cessation encouraged.   Urine drug screen negative  Tobacco dependence: Counseling completed.  Patient not interested. Refuses nicotine patch  Morbid Obesity: Diet and exercise discussed in detail.  Weight loss encouraged   DVT prophylaxis: Lovenox Code Status: Full code Family Communication: Wife at bedside Disposition Plan:   Status is: Inpatient Remains inpatient appropriate because:   Admitted for chest pain, simulating angina.  Cardiology is consulted, managing his ACS.   Patient is scheduled to have right and left heart cath early next week   Consultants:  Cardiology  Procedures: Echocardiogram Antimicrobials: None  Subjective: Patient was seen and examined at bedside.  Overnight events noted.   Patient reports still having pain but reports much improved.  Objective: Vitals:   05/10/22 1658 05/10/22 1959 05/10/22 2352 05/11/22 0417  BP:  (!) 142/95 (!) 134/96 (!) 135/99  Pulse:  99 86 91  Resp:  16 16 16   Temp: 98.1 F (36.7 C) 98.3 F (36.8 C) 98.6 F (37 C) 98 F (36.7 C)  TempSrc: Oral Oral Oral Oral  SpO2:   96% 99%  Weight: (!) 153.2 kg     Height: 6\' 1"  (1.854 m)       Intake/Output Summary (Last 24 hours) at 05/11/2022 1355 Last data filed at 05/11/2022 1000 Gross per 24 hour  Intake 920 ml  Output 900 ml  Net 20 ml   Filed Weights   05/10/22 1015 05/10/22 1658  Weight: (!) 157 kg (!) 153.2 kg    Examination:  General exam: Appears comfortable, not in any acute distress. Respiratory system: CTA bilaterally, respiratory effort normal, RR 15 Cardiovascular system: S1 & S2 heard, regular rate and rhythm, no murmur. Gastrointestinal system: Abdomen is soft, distended, nontender,  BS + Central nervous system: Alert and oriented x 3 . No focal neurological deficits. Extremities: No edema, no cyanosis, no clubbing. Skin: No rashes, lesions or ulcers Psychiatry: Judgement and insight appear normal. Mood & affect appropriate.     Data Reviewed: I have personally  reviewed following labs and imaging studies  CBC: Recent Labs  Lab 05/10/22 1022 05/11/22 0309  WBC 6.8 7.6  HGB 16.5 16.1  HCT 48.7 48.0  MCV 84.5 84.4  PLT 270 [id number]   Basic Metabolic Panel: Recent Labs  Lab 05/10/22 1022 05/10/22 1630 05/11/22 0309  NA 139  --  137  K 3.0*  --  3.1*  CL 105  --  103  CO2 26  --  26  GLUCOSE 146*  --  110*  BUN 9  --  10  CREATININE 1.08  --  1.10  CALCIUM 8.9  --  8.8*  MG  --  1.9 2.0   GFR: Estimated Creatinine Clearance: 139.3 mL/min (by C-G formula based on SCr of 1.1 mg/dL). Liver Function Tests: Recent Labs  Lab 05/10/22 1219  AST 21  ALT 25  ALKPHOS 70  BILITOT 0.4  PROT 7.0  ALBUMIN 3.6   No results for input(s): LIPASE, AMYLASE in the last 168 hours. No results for input(s): AMMONIA in the last 168 hours. Coagulation Profile: No results for input(s): INR, PROTIME in the last 168 hours. Cardiac Enzymes: No results for input(s): CKTOTAL, CKMB, CKMBINDEX, TROPONINI in the last 168 hours. BNP (last 3 results) No results for input(s): PROBNP in the last 8760 hours. HbA1C: Recent Labs    05/11/22 0309  HGBA1C 5.6   CBG: No results for input(s): GLUCAP in the last 168 hours. Lipid Profile: Recent Labs    05/11/22 0309  CHOL 130  HDL 31*  LDLCALC 79  TRIG 102  CHOLHDL 4.2   Thyroid Function Tests: Recent Labs    05/10/22 1630  TSH 0.643   Anemia Panel: No results for input(s): VITAMINB12, FOLATE, FERRITIN, TIBC, IRON, RETICCTPCT in the last 72 hours. Sepsis Labs: No results for input(s): PROCALCITON, LATICACIDVEN in the last 168 hours.  Recent Results (from the past 240 hour(s))  SARS Coronavirus 2 by RT PCR (hospital order, performed in [facility] lab) *cepheid single result test* Anterior Nasal Swab     Status: None   Collection Time: 05/10/22  1:47 PM   Specimen: Anterior Nasal Swab  Result Value Ref Range Status   SARS Coronavirus 2 by RT PCR NEGATIVE NEGATIVE Final    Comment:  (NOTE) SARS-CoV-2 target nucleic acids are NOT DETECTED.  The SARS-CoV-2 RNA is generally detectable in upper and lower respiratory specimens during the acute phase of infection. The lowest concentration of SARS-CoV-2 viral copies this assay can detect is 250 copies / mL. A negative result does not preclude SARS-CoV-2 infection and should not be used as the sole basis for treatment or other patient management decisions.  A negative result may occur with improper specimen collection / handling, submission of specimen other than nasopharyngeal swab, presence of viral mutation(s) within the areas targeted by this assay, and inadequate number of viral copies (<250 copies / mL). A negative result must be combined with clinical observations, patient history, and epidemiological information.  Fact Sheet for Patients:   https://www.patel.info/  Fact Sheet for Healthcare Providers: https://hall.com/  This test is not yet approved or  cleared by the [place] FDA and has been authorized for detection and/or diagnosis of SARS-CoV-2 by  FDA under an Emergency Use Authorization (EUA).  This EUA will remain in effect (meaning this test can be used) for the duration of the COVID-19 declaration under Section 564(b)(1) of the Act, 21 U.S.C. section 360bbb-3(b)(1), unless the authorization is terminated or revoked sooner.  Performed at [facility], [address] [place], [place] [address]     Radiology Studies: DG Chest 2 View  Result Date: 05/10/2022 CLINICAL DATA:  Shortness of breath beginning couple weeks ago, coughing for months, chest pain since last week, history hypertension EXAM: CHEST - 2 VIEW COMPARISON:  222 20,019 FINDINGS: Enlargement of cardiac silhouette. Mediastinal contours and pulmonary vascularity normal. RIGHT basilar interstitial infiltrate. Remaining lungs clear. No pleural effusion or pneumothorax. IMPRESSION: Enlargement  of cardiac silhouette. RIGHT basilar is tissue infiltrate question pneumonia. Electronically Signed   By: Ulyses Southward M.D.   On: 05/10/2022 10:38   ECHOCARDIOGRAM COMPLETE  Result Date: 05/11/2022    ECHOCARDIOGRAM REPORT   Patient Name:   Henry Chandler Date of Exam: 05/10/2022 Medical Rec #:  [id number]   Height:       73.0 in Accession #:    [id number]  Weight:       346.1 lb Date of Birth:  1981-11-09   BSA:          2.716 m Patient Age:    40 years    BP:           149/105 mmHg Patient Gender: M           HR:           92 bpm. Exam Location:  Inpatient Procedure: 2D Echo, Cardiac Doppler and Color Doppler Indications:    Dyspnea  History:        Patient has no prior history of Echocardiogram examinations.                 Risk Factors:Hypertension.  Sonographer:    Eduard Roux Referring Phys: [id number] DAYNA N DUNN IMPRESSIONS  1. Left ventricular ejection fraction, by estimation, is 20 to 25%. The left ventricle has severely decreased function. The left ventricle demonstrates global hypokinesis, worse anteriorly. The left ventricular internal cavity size was severely dilated.  There is moderate left ventricular hypertrophy. Left ventricular diastolic parameters are consistent with Grade II diastolic dysfunction (pseudonormalization). Elevated left ventricular end-diastolic pressure.  2. Right ventricular systolic function is low normal. The right ventricular size is normal. There is normal pulmonary artery systolic pressure. The estimated right ventricular systolic pressure is 33.9 mmHg.  3. The pericardial effusion is circumferential.  4. The mitral valve is abnormal. Mild mitral valve regurgitation.  5. The aortic valve is tricuspid. Aortic valve regurgitation is not visualized.  6. The inferior vena cava is normal in size with greater than 50% respiratory variability, suggesting right atrial pressure of 3 mmHg. Comparison(s): No prior Echocardiogram. FINDINGS  Left Ventricle: Left ventricular ejection  fraction, by estimation, is 20 to 25%. The left ventricle has severely decreased function. The left ventricle demonstrates global hypokinesis. The left ventricular internal cavity size was severely dilated. There is moderate left ventricular hypertrophy. Left ventricular diastolic parameters are consistent with Grade II diastolic dysfunction (pseudonormalization). Elevated left ventricular end-diastolic pressure. Right Ventricle: The right ventricular size is normal. No increase in right ventricular wall thickness. Right ventricular systolic function is low normal. There is normal pulmonary artery systolic pressure. The tricuspid regurgitant velocity is 2.78 m/s,  and with an assumed right atrial pressure of 3 mmHg, the  estimated right ventricular systolic pressure is [id number] mmHg. Left Atrium: Left atrial size was normal in size. Right Atrium: Right atrial size was normal in size. Pericardium: Trivial pericardial effusion is present. The pericardial effusion is circumferential. Mitral Valve: The mitral valve is abnormal. There is mild thickening of the anterior and posterior mitral valve leaflet(s). Mild mitral valve regurgitation. Tricuspid Valve: The tricuspid valve is grossly normal. Tricuspid valve regurgitation is mild. Aortic Valve: The aortic valve is tricuspid. Aortic valve regurgitation is not visualized. Aortic valve peak gradient measures 5.9 mmHg. Pulmonic Valve: The pulmonic valve was normal in structure. Pulmonic valve regurgitation is not visualized. Aorta: The aortic root and ascending aorta are structurally normal, with no evidence of dilitation. Venous: The inferior vena cava is normal in size with greater than 50% respiratory variability, suggesting right atrial pressure of 3 mmHg. IAS/Shunts: No atrial level shunt detected by color flow Doppler.  LEFT VENTRICLE PLAX 2D LVIDd:         6.80 cm LVIDs:         6.20 cm LV PW:         1.50 cm LV IVS:        1.40 cm LVOT diam:     2.40 cm LV SV:          86 LV SV Index:   31 LVOT Area:     4.52 cm  IVC IVC diam: 1.80 cm LEFT ATRIUM             Index        RIGHT ATRIUM           Index LA diam:        5.80 cm 2.14 cm/m   RA Area:     14.00 cm LA Vol (A2C):   85.8 ml 31.59 ml/m  RA Volume:   29.70 ml  10.93 ml/m LA Vol (A4C):   75.2 ml 27.68 ml/m LA Biplane Vol: 82.6 ml 30.41 ml/m  AORTIC VALVE                 PULMONIC VALVE AV Area (Vmax): 4.30 cm     PV Vmax:       0.73 m/s AV Vmax:        121.00 cm/s  PV Peak grad:  2.1 mmHg AV Peak Grad:   5.9 mmHg LVOT Vmax:      115.00 cm/s LVOT Vmean:     64.000 cm/s LVOT VTI:       0.189 m  AORTA Ao Root diam: 3.60 cm Ao Asc diam:  3.60 cm MITRAL VALVE               TRICUSPID VALVE MV Area (PHT): 6.48 cm    TR Peak grad:   30.9 mmHg MV Decel Time: 117 msec    TR Vmax:        278.00 cm/s MV E velocity: 99.00 cm/s MV A velocity: 62.60 cm/s  SHUNTS MV E/A ratio:  1.58        Systemic VTI:  0.19 m                            Systemic Diam: 2.40 cm Lyman Bishop MD Electronically signed by Lyman Bishop MD Signature Date/Time: 05/11/2022/10:42:22 AM    Final     Scheduled Meds:  aspirin EC  81 mg Oral Daily   atorvastatin  40 mg Oral q1800   enoxaparin (LOVENOX) injection  80 mg Subcutaneous Q24H   furosemide  40 mg Intravenous BID   irbesartan  300 mg Oral Daily   potassium chloride  40 mEq Oral Daily   sodium chloride flush  3 mL Intravenous Q12H   spironolactone  25 mg Oral Daily   Continuous Infusions:  sodium chloride       LOS: 1 day    Time spent: 50 mins    Longino Trefz, MD [facility]   If 7PM-7AM, please contact night-coverage

## 2022-05-11 NOTE — Progress Notes (Signed)
DAILY PROGRESS NOTE   Patient Name: Henry Chandler Date of Encounter: 05/11/2022 Cardiologist: None  Chief Complaint   Shortness of breath  Patient Profile   Henry Chandler is a 41 y.o. male with a hx of uncontrolled HTN (dx around age 55), Covid PNA with associated respiratory failure requiring [place] O2 05/2019 (tx at OSH, c/b rhabdomyolysis and AKI), snoring, morbid obesity, tobacco abuse, habitual alcohol use (10-15 shots/week), family history of heart disease who is being seen 05/10/2022 for the evaluation of elevated troponin at the request of Dr. Estell Harpin.  Subjective   BP improved overnight- now in the 130-140's systolic over 90's diastolic. Potassium 3.1 today. Magnesium repleted to 2.0 today. LDL 79. Echo completed yesterday, but not read.  I personally reviewed the echo today which shows LVEF 20 to 25%, global hypokinesis and severe dilatation with somewhat worse anterior function.  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  Objective   Vitals:   05/10/22 1658 05/10/22 1959 05/10/22 2352 05/11/22 0417  BP:  (!) 142/95 (!) 134/96 (!) 135/99  Pulse:  99 86 91  Resp:  Temp: 98.1 F (36.7 C) 98.3 F (36.8 C) 98.6 F (37 C) 98 F (36.7 C)  TempSrc: Oral Oral Oral Oral  SpO2:   96% 99%  Weight: (!) 153.2 kg     Height:  (1.854 m)       Intake/Output Summary (Last 24 hours) at 05/11/2022 1101 Last data filed at 05/11/2022 1000 Gross per 24 hour  Intake 920 ml  Output 900 ml  Net 20 ml   Filed Weights   05/10/22 1015 05/10/22 1658  Weight: (!) 157 kg (!) 153.2 kg    Physical Exam   General appearance: alert, no distress, and morbidly obese Neck: JVD - 7 cm above sternal notch, no carotid bruit, and thyroid not enlarged, symmetric, no tenderness/mass/nodules Lungs: diminished breath sounds bibasilar Heart: regular rate and rhythm, S1, S2 normal, no murmur, click, rub or gallop Abdomen: soft, non-tender;  bowel sounds normal; no masses,  no organomegaly and obese Extremities: edema trace to 1+ bilateral Pulses: 2+ and symmetric Skin: Skin color, texture, turgor normal. No rashes or lesions Neurologic: Grossly normal Psych: Flat affect  Inpatient Medications    Scheduled Meds:  aspirin EC  81 mg Oral Daily   atorvastatin  40 mg Oral q1800   enoxaparin (LOVENOX) injection  80 mg Subcutaneous Q24H   furosemide  40 mg Intravenous BID   irbesartan  300 mg Oral Daily   potassium chloride  40 mEq Oral Once   potassium chloride  40 mEq Oral Daily   sodium chloride flush  3 mL Intravenous Q12H    Continuous Infusions:  sodium chloride      PRN Meds: sodium chloride, acetaminophen, hydrALAZINE, nitroGLYCERIN, sodium chloride flush   Labs   Results for orders placed or performed during the hospital encounter of 05/10/22 (from the past 48 hour(s))  Basic metabolic panel     Status: Abnormal   Collection Time: 05/10/22 10:22 AM  Result Value Ref Range   Sodium 139 135 - 145 mmol/L   Potassium 3.0 (L) 3.5 - 5.1 mmol/L   Chloride 105 98 - 111 mmol/L   CO2 26 22 - 32 mmol/L   Glucose, Bld 146 (H) 70 - 99 mg/dL    Comment: Glucose reference range applies only to samples taken after fasting for at least 8 hours.   BUN  9 6 - 20 mg/dL   Creatinine, Ser 8.85 0.61 - 1.24 mg/dL   Calcium 8.9 8.9 - 02.7 mg/dL   GFR, Estimated >74 >12 mL/min    Comment: (NOTE) Calculated using the CKD-EPI Creatinine Equation (2021)    Anion gap 8 5 - 15    Comment: Performed at [facility], [address], [place], [place] [address]  CBC     Status: None   Collection Time: 05/10/22 10:22 AM  Result Value Ref Range   WBC 6.8 4.0 - 10.5 K/uL   RBC 5.76 4.22 - 5.81 MIL/uL   Hemoglobin 16.5 13.0 - 17.0 g/dL   HCT 67.2 09.4 - 70.9 %   MCV 84.5 80.0 - 100.0 fL   MCH 28.6 26.0 - 34.0 pg   MCHC 33.9 30.0 - 36.0 g/dL   RDW 62.8 36.6 - 29.4 %   Platelets 270 150 - 400 K/uL   nRBC 0.0 0.0 - 0.2 %     Comment: Performed at [facility], [address], [place], [place] [address]  Troponin I (High Sensitivity)     Status: Abnormal   Collection Time: 05/10/22 10:22 AM  Result Value Ref Range   Troponin I (High Sensitivity) 63 (H) <18 ng/L    Comment: (NOTE) Elevated high sensitivity troponin I (hsTnI) values and significant  changes across serial measurements may suggest ACS but many other  chronic and acute conditions are known to elevate hsTnI results.  Refer to the "Links" section for chest pain algorithms and additional  guidance. Performed at [facility], [address] [place], [place] [address]   Brain natriuretic peptide     Status: Abnormal   Collection Time: 05/10/22 10:22 AM  Result Value Ref Range   B Natriuretic Peptide 536.0 (H) 0.0 - 100.0 pg/mL    Comment: Performed at [facility], [address] [place], [place] [address]  Hepatic function panel     Status: Abnormal   Collection Time: 05/10/22 12:19 PM  Result Value Ref Range   Total Protein 7.0 6.5 - 8.1 g/dL   Albumin 3.6 3.5 - 5.0 g/dL   AST 21 15 - 41 U/L   ALT 25 0 - 44 U/L   Alkaline Phosphatase 70 38 - 126 U/L   Total Bilirubin 0.4 0.3 - 1.2 mg/dL   Bilirubin, Direct 0.2 0.0 - 0.2 mg/dL   Indirect Bilirubin 0.2 (L) 0.3 - 0.9 mg/dL    Comment: Performed at [facility], [address], [place], [place] [address]  Troponin I (High Sensitivity)     Status: Abnormal   Collection Time: 05/10/22 12:19 PM  Result Value Ref Range   Troponin I (High Sensitivity) 70 (H) <18 ng/L    Comment: (NOTE) Elevated high sensitivity troponin I (hsTnI) values and significant  changes across serial measurements may suggest ACS but many other  chronic and acute conditions are known to elevate hsTnI results.  Refer to the "Links" section for chest pain algorithms and additional  guidance. Performed at [facility], [address], [place], [place] [address]   SARS Coronavirus 2 by RT PCR  (hospital order, performed in [facility] hospital lab) *cepheid single result test* Anterior Nasal Swab     Status: None   Collection Time: 05/10/22  1:47 PM   Specimen: Anterior Nasal Swab  Result Value Ref Range   SARS Coronavirus 2 by RT PCR NEGATIVE NEGATIVE    Comment: (  NOTE) SARS-CoV-2 target nucleic acids are NOT DETECTED.  The SARS-CoV-2 RNA is generally detectable in upper and lower respiratory specimens during the acute phase of infection. The lowest concentration of SARS-CoV-2 viral copies this assay can detect is 250 copies / mL. A negative result does not preclude SARS-CoV-2 infection and should not be used as the sole basis for treatment or other patient management decisions.  A negative result may occur with improper specimen collection / handling, submission of specimen other than nasopharyngeal swab, presence of viral mutation(s) within the areas targeted by this assay, and inadequate number of viral copies (<250 copies / mL). A negative result must be combined with clinical observations, patient history, and epidemiological information.  Fact Sheet for Patients:   RoadLapTop.co.za  Fact Sheet for Healthcare Providers: http://kim-miller.com/  This test is not yet approved or  cleared by the [place] FDA and has been authorized for detection and/or diagnosis of SARS-CoV-2 by FDA under an Emergency Use Authorization (EUA).  This EUA will remain in effect (meaning this test can be used) for the duration of the COVID-19 declaration under Section 564(b)(1) of the Act, 21 U.S.C. section 360bbb-3(b)(1), unless the authorization is terminated or revoked sooner.  Performed at [facility], [address] [place], [place] [address]   Rapid urine drug screen (hospital performed)     Status: None   Collection Time: 05/10/22  3:42 PM  Result Value Ref Range   Opiates NONE DETECTED NONE DETECTED   Cocaine NONE  DETECTED NONE DETECTED   Benzodiazepines NONE DETECTED NONE DETECTED   Amphetamines NONE DETECTED NONE DETECTED   Tetrahydrocannabinol NONE DETECTED NONE DETECTED   Barbiturates NONE DETECTED NONE DETECTED    Comment: (NOTE) DRUG SCREEN FOR MEDICAL PURPOSES ONLY.  IF CONFIRMATION IS NEEDED FOR ANY PURPOSE, NOTIFY LAB WITHIN 5 DAYS.  LOWEST DETECTABLE LIMITS FOR URINE DRUG SCREEN Drug Class                     Cutoff (ng/mL) Amphetamine and metabolites    1000 Barbiturate and metabolites    200 Benzodiazepine                 200 Tricyclics and metabolites     300 Opiates and metabolites        300 Cocaine and metabolites        300 THC                            50 Performed at [facility], [address] [place], [place] [address]   Magnesium     Status: None   Collection Time: 05/10/22  4:30 PM  Result Value Ref Range   Magnesium 1.9 1.7 - 2.4 mg/dL    Comment: Performed at [facility], [address] [place], [place] [address]  TSH     Status: None   Collection Time: 05/10/22  4:30 PM  Result Value Ref Range   TSH 0.643 0.350 - 4.500 uIU/mL    Comment: Performed by a 3rd Generation assay with a functional sensitivity of <=0.01 uIU/mL. Performed at [facility], [address] [place], [place] [address]   HIV Antibody (routine testing w rflx)     Status: None   Collection Time: 05/10/22  4:30 PM  Result Value Ref Range   HIV Screen 4th Generation wRfx Non Reactive Non Reactive    Comment: Performed  at [facility], [address], [place], [place] [address]  Basic metabolic panel     Status: Abnormal   Collection Time: 05/11/22  3:09 AM  Result Value Ref Range   Sodium 137 135 - 145 mmol/L   Potassium 3.1 (L) 3.5 - 5.1 mmol/L   Chloride 103 98 - 111 mmol/L   CO2 26 22 - 32 mmol/L   Glucose, Bld 110 (H) 70 - 99 mg/dL    Comment: Glucose reference range applies only to samples taken after fasting for at least 8 hours.   BUN 10 6 - 20 mg/dL    Creatinine, Ser 6.04 0.61 - 1.24 mg/dL   Calcium 8.8 (L) 8.9 - 10.3 mg/dL   GFR, Estimated >54 >09 mL/min    Comment: (NOTE) Calculated using the CKD-EPI Creatinine Equation (2021)    Anion gap 8 5 - 15    Comment: Performed at [facility], [address], [place], [place] [address]  Lipid panel     Status: Abnormal   Collection Time: 05/11/22  3:09 AM  Result Value Ref Range   Cholesterol 130 0 - 200 mg/dL   Triglycerides 478 <295 mg/dL   HDL 31 (L) >62 mg/dL   Total CHOL/HDL Ratio 4.2 RATIO   VLDL 20 0 - 40 mg/dL   LDL Cholesterol 79 0 - 99 mg/dL    Comment:        Total Cholesterol/HDL:CHD Risk Coronary Heart Disease Risk Table                     Men   Women  1/2 Average Risk   3.4   3.3  Average Risk       5.0   4.4  2 X Average Risk   9.6   7.1  3 X Average Risk  23.4   11.0        Use the calculated Patient Ratio above and the CHD Risk Table to determine the patient's CHD Risk.        ATP III CLASSIFICATION (LDL):  <100     mg/dL   Optimal  130-865  mg/dL   Near or Above                    Optimal  130-159  mg/dL   Borderline  784-696  mg/dL   High  >295     mg/dL   Very High Performed at [facility], [address] [place], [place] [address]   CBC     Status: None   Collection Time: 05/11/22  3:09 AM  Result Value Ref Range   WBC 7.6 4.0 - 10.5 K/uL   RBC 5.69 4.22 - 5.81 MIL/uL   Hemoglobin 16.1 13.0 - 17.0 g/dL   HCT 24.4 01.0 - 27.2 %   MCV 84.4 80.0 - 100.0 fL   MCH 28.3 26.0 - 34.0 pg   MCHC 33.5 30.0 - 36.0 g/dL   RDW 53.6 64.4 - 03.4 %   Platelets 245 150 - 400 K/uL   nRBC 0.0 0.0 - 0.2 %    Comment: Performed at [facility], [address], [place], [place] [address]  Hemoglobin A1c     Status: None   Collection Time: 05/11/22  3:09 AM  Result Value Ref Range   Hgb A1c MFr Bld 5.6 4.8 - 5.6 %    Comment: (NOTE) Pre diabetes:  5.7%-6.4%  Diabetes:              >6.4%  Glycemic control for   <7.0% adults with  diabetes    Mean Plasma Glucose 114.02 mg/dL    Comment: Performed at [facility], [address] [place], [place] [address]  Magnesium     Status: None   Collection Time: 05/11/22  3:09 AM  Result Value Ref Range   Magnesium 2.0 1.7 - 2.4 mg/dL    Comment: Performed at [facility], [address] [place], [place] [address]    ECG   Sinus rhythm at 87, inferior and lateral T wave inversions- Personally Reviewed  Telemetry   Sinus rhythm/sinus tachycardia- Personally Reviewed  Radiology    DG Chest 2 View  Result Date: 05/10/2022 CLINICAL DATA:  Shortness of breath beginning couple weeks ago, coughing for months, chest pain since last week, history hypertension EXAM: CHEST - 2 VIEW COMPARISON:  222 20,019 FINDINGS: Enlargement of cardiac silhouette. Mediastinal contours and pulmonary vascularity normal. RIGHT basilar interstitial infiltrate. Remaining lungs clear. No pleural effusion or pneumothorax. IMPRESSION: Enlargement of cardiac silhouette. RIGHT basilar is tissue infiltrate question pneumonia. Electronically Signed   By: Ulyses Southward M.D.   On: 05/10/2022 10:38   ECHOCARDIOGRAM COMPLETE  Result Date: 05/11/2022    ECHOCARDIOGRAM REPORT   Patient Name:   Henry Chandler Date of Exam: 05/10/2022 Medical Rec #:  [id number]   Height:       73.0 in Accession #:    [id number]  Weight:       346.1 lb Date of Birth:  [birth year]   BSA:          2.716 m Patient Age:    40 years    BP:           149/105 mmHg Patient Gender: M           HR:           92 bpm. Exam Location:  Inpatient Procedure: 2D Echo, Cardiac Doppler and Color Doppler Indications:    Dyspnea  History:        Patient has no prior history of Echocardiogram examinations.                 Risk Factors:Hypertension.  Sonographer:    Eduard Roux Referring Phys: [id number] DAYNA N DUNN IMPRESSIONS  1. Left ventricular ejection fraction, by estimation, is 20 to 25%. The left ventricle has severely decreased function. The left  ventricle demonstrates global hypokinesis, worse anteriorly. The left ventricular internal cavity size was severely dilated.  There is moderate left ventricular hypertrophy. Left ventricular diastolic parameters are consistent with Grade II diastolic dysfunction (pseudonormalization). Elevated left ventricular end-diastolic pressure.  2. Right ventricular systolic function is low normal. The right ventricular size is normal. There is normal pulmonary artery systolic pressure. The estimated right ventricular systolic pressure is 33.9 mmHg.  3. The pericardial effusion is circumferential.  4. The mitral valve is abnormal. Mild mitral valve regurgitation.  5. The aortic valve is tricuspid. Aortic valve regurgitation is not visualized.  6. The inferior vena cava is normal in size with greater than 50% respiratory variability, suggesting right atrial pressure of 3 mmHg. Comparison(s): No prior Echocardiogram. FINDINGS  Left Ventricle: Left ventricular ejection fraction, by estimation, is 20 to 25%. The left ventricle has severely decreased function. The left ventricle demonstrates global hypokinesis. The left ventricular internal cavity size was severely dilated. There is moderate left ventricular hypertrophy.  Left ventricular diastolic parameters are consistent with Grade II diastolic dysfunction (pseudonormalization). Elevated left ventricular end-diastolic pressure. Right Ventricle: The right ventricular size is normal. No increase in right ventricular wall thickness. Right ventricular systolic function is low normal. There is normal pulmonary artery systolic pressure. The tricuspid regurgitant velocity is 2.78 m/s,  and with an assumed right atrial pressure of 3 mmHg, the estimated right ventricular systolic pressure is 33.9 mmHg. Left Atrium: Left atrial size was normal in size. Right Atrium: Right atrial size was normal in size. Pericardium: Trivial pericardial effusion is present. The pericardial effusion is  circumferential. Mitral Valve: The mitral valve is abnormal. There is mild thickening of the anterior and posterior mitral valve leaflet(s). Mild mitral valve regurgitation. Tricuspid Valve: The tricuspid valve is grossly normal. Tricuspid valve regurgitation is mild. Aortic Valve: The aortic valve is tricuspid. Aortic valve regurgitation is not visualized. Aortic valve peak gradient measures 5.9 mmHg. Pulmonic Valve: The pulmonic valve was normal in structure. Pulmonic valve regurgitation is not visualized. Aorta: The aortic root and ascending aorta are structurally normal, with no evidence of dilitation. Venous: The inferior vena cava is normal in size with greater than 50% respiratory variability, suggesting right atrial pressure of 3 mmHg. IAS/Shunts: No atrial level shunt detected by color flow Doppler.  LEFT VENTRICLE PLAX 2D LVIDd:         6.80 cm LVIDs:         6.20 cm LV PW:         1.50 cm LV IVS:        1.40 cm LVOT diam:     2.40 cm LV SV:         86 LV SV Index:   31 LVOT Area:     4.52 cm  IVC IVC diam: 1.80 cm LEFT ATRIUM             Index        RIGHT ATRIUM           Index LA diam:        5.80 cm 2.14 cm/m   RA Area:     14.00 cm LA Vol (A2C):   85.8 ml 31.59 ml/m  RA Volume:   29.70 ml  10.93 ml/m LA Vol (A4C):   75.2 ml 27.68 ml/m LA Biplane Vol: 82.6 ml 30.41 ml/m  AORTIC VALVE                 PULMONIC VALVE AV Area (Vmax): 4.30 cm     PV Vmax:       0.73 m/s AV Vmax:        121.00 cm/s  PV Peak grad:  2.1 mmHg AV Peak Grad:   5.9 mmHg LVOT Vmax:      115.00 cm/s LVOT Vmean:     64.000 cm/s LVOT VTI:       0.189 m  AORTA Ao Root diam: 3.60 cm Ao Asc diam:  3.60 cm MITRAL VALVE               TRICUSPID VALVE MV Area (PHT): 6.48 cm    TR Peak grad:   30.9 mmHg MV Decel Time: 117 msec    TR Vmax:        278.00 cm/s MV E velocity: 99.00 cm/s MV A velocity: 62.60 cm/s  SHUNTS MV E/A ratio:  1.58        Systemic VTI:  0.19 m  Systemic Diam: 2.40 cm Zoila Shutter MD  Electronically signed by Zoila Shutter MD Signature Date/Time: 05/11/2022/10:42:22 AM    Final     Cardiac Studies   See echo above  Assessment   Principal Problem:   Chest pain of uncertain etiology Active Problems:   Essential hypertension   Hyperglycemia   Tobacco use   Class 3 severe obesity due to excess calories with serious comorbidity and body mass index (BMI) of 40.0 to 44.9 in adult [facility])   History of medication noncompliance   Polysubstance abuse (HCC)   Plan   Mr. Stefanelli had presented with shortness of breath, cough, exertional chest tightness and noted to have an elevated troponins of 63 and 70.  Echo personally reviewed showed LVEF 20 to 25% with severe LV dilatation and predominant global hypokinesis, worse anteriorly.  Blood pressure is improved somewhat today.  LDL 79, LP(a) pending.  Potassium remains low at 3.1 with repleted magnesium.  Additional 40 mEq potassium ordered today.  Add Aldactone 25 mg daily.  Continue Lasix 40 IV twice daily.  Recorded output was minimal at 180 cc overnight.  Weight however down 4 kg?  Monitor daily weights and strict I's and O's.  We will hold off on adding carvedilol until he is more clinically compensated.  We will need definitive ischemia evaluation.  Multiple risk factors for coronary disease with some regional wall motion abnormalities, therefore I would favor right and left heart catheterization early next week.  Okay to resume heart healthy, low-sodium diet today.  Time Spent Directly with Patient:  I have spent a total of 35 minutes with the patient reviewing hospital notes, telemetry, EKGs, labs and examining the patient as well as establishing an assessment and plan that was discussed personally with the patient.  > 50% of time was spent in direct patient care.  Length of Stay:  LOS: 1 day   Chrystie Nose, MD, [facility], FACP  [place]  [facility]  Medical Director of the Advanced Lipid Disorders &  Cardiovascular Risk  Reduction Clinic Diplomate of the American Board of Clinical Lipidology Attending Cardiologist  Direct Dial: [phone]  Fax: [phone]  Website:  [email or web address] Itzelle Gains 05/11/2022, 11:01 AM

## 2022-05-12 DIAGNOSIS — R079 Chest pain, unspecified: Secondary | ICD-10-CM | POA: Diagnosis not present

## 2022-05-12 DIAGNOSIS — F1910 Other psychoactive substance abuse, uncomplicated: Secondary | ICD-10-CM | POA: Diagnosis not present

## 2022-05-12 DIAGNOSIS — I10 Essential (primary) hypertension: Secondary | ICD-10-CM | POA: Diagnosis not present

## 2022-05-12 DIAGNOSIS — I5021 Acute systolic (congestive) heart failure: Secondary | ICD-10-CM | POA: Diagnosis not present

## 2022-05-12 DIAGNOSIS — Z720 Tobacco use: Secondary | ICD-10-CM

## 2022-05-12 LAB — CBC
HCT: 49.7 % (ref 39.0–52.0)
Hemoglobin: 16.9 g/dL (ref 13.0–17.0)
MCH: 28.7 pg (ref 26.0–34.0)
MCHC: 34 g/dL (ref 30.0–36.0)
MCV: 84.4 fL (ref 80.0–100.0)
Platelets: 248 10*3/uL (ref 150–400)
RBC: 5.89 MIL/uL — ABNORMAL HIGH (ref 4.22–5.81)
RDW: 14.4 % (ref 11.5–15.5)
WBC: 10.1 10*3/uL (ref 4.0–10.5)
nRBC: 0 % (ref 0.0–0.2)

## 2022-05-12 LAB — MAGNESIUM: Magnesium: 2 mg/dL (ref 1.7–2.4)

## 2022-05-12 LAB — BASIC METABOLIC PANEL
Anion gap: 8 (ref 5–15)
BUN: 15 mg/dL (ref 6–20)
CO2: 25 mmol/L (ref 22–32)
Calcium: 9.2 mg/dL (ref 8.9–10.3)
Chloride: 107 mmol/L (ref 98–111)
Creatinine, Ser: 1.19 mg/dL (ref 0.61–1.24)
GFR, Estimated: >60 mL/min (ref >60)
Glucose, Bld: 115 mg/dL — ABNORMAL HIGH (ref 70–99)
Potassium: 3.4 mmol/L — ABNORMAL LOW (ref 3.5–5.1)
Sodium: 140 mmol/L (ref 135–145)

## 2022-05-12 LAB — PHOSPHORUS: Phosphorus: 4.3 mg/dL (ref 2.5–4.6)

## 2022-05-12 MED ORDER — HYDRALAZINE HCL 25 MG PO TABS
25.0000 mg | ORAL_TABLET | Freq: Three times a day (TID) | ORAL | Status: DC
  Administered 2022-05-12 – 2022-05-15 (×10): 25 mg via ORAL
  Filled 2022-05-12 (×9): qty 1

## 2022-05-12 MED ORDER — SODIUM CHLORIDE 0.9% FLUSH
3.0000 mL | Freq: Two times a day (BID) | INTRAVENOUS | Status: DC
  Administered 2022-05-12 – 2022-05-14 (×4): 3 mL via INTRAVENOUS

## 2022-05-12 MED ORDER — POTASSIUM CHLORIDE 20 MEQ PO PACK
40.0000 meq | PACK | Freq: Once | ORAL | Status: AC
  Administered 2022-05-12: 40 meq via ORAL
  Filled 2022-05-12: qty 2

## 2022-05-12 NOTE — Progress Notes (Signed)
DAILY PROGRESS NOTE   Patient Name: Henry Chandler Date of Encounter: 05/12/2022 Cardiologist: None  Chief Complaint   Shortness of breath  Patient Profile   Henry Chandler is a 41 y.o. male with a hx of uncontrolled HTN (dx around age 89), Covid PNA with associated respiratory failure requiring [place] O[facility] 05/2019 (tx at OSH, c/b rhabdomyolysis and AKI), snoring, morbid obesity, tobacco abuse, habitual alcohol use (10-15 shots/week), family history of heart disease who is being seen 05/10/2022 for the evaluation of elevated troponin at the request of Dr. Estell Harpin.  Subjective   Blood pressure appears somewhat improved.  Overnight urine output was minimal 680 cc negative.  Weight has not been reassessed.  We will monitor daily weights and strict I's and O's.  No chest pain overnight.  Objective   Vitals:   05/12/22 0032 05/12/22 0442 05/12/22 0825 05/12/22 0839  BP: (!) 117/92 (!) 124/92 122/87 (!) 132/98  Pulse: 94 98 86   Resp: 17 16 16    Temp: 97.9 F (36.6 C) 98.2 F (36.8 C) 98.2 F (36.8 C)   TempSrc: Oral Oral Oral   SpO2: 96% 92% 94%   Weight:      Height:        Intake/Output Summary (Last 24 hours) at 05/12/2022 1022 Last data filed at 05/12/2022 05/14/2022 Gross per 24 hour  Intake 820 ml  Output 1500 ml  Net -680 ml   Filed Weights   05/10/22 1015 05/10/22 1658  Weight: (!) 157 kg (!) 153.2 kg    Physical Exam   General appearance: alert, no distress, and morbidly obese Neck: JVD - 5 cm above sternal notch, no carotid bruit, and thyroid not enlarged, symmetric, no tenderness/mass/nodules Lungs: diminished breath sounds bibasilar Heart: regular rate and rhythm, S1, S2 normal, no murmur, click, rub or gallop Abdomen: soft, non-tender; bowel sounds normal; no masses,  no organomegaly and obese Extremities: edema trace to 1+ bilateral Pulses: 2+ and symmetric Skin: Skin color, texture, turgor normal. No rashes or lesions Neurologic: Grossly normal Psych: Flat  affect  Inpatient Medications    Scheduled Meds:  aspirin EC  81 mg Oral Daily   atorvastatin  40 mg Oral q1800   enoxaparin (LOVENOX) injection  80 mg Subcutaneous Q24H   furosemide  40 mg Intravenous BID   irbesartan  300 mg Oral Daily   potassium chloride  40 mEq Oral Once   potassium chloride  40 mEq Oral Daily   sodium chloride flush  3 mL Intravenous Q12H   spironolactone  25 mg Oral Daily    Continuous Infusions:  sodium chloride      PRN Meds: sodium chloride, acetaminophen, hydrALAZINE, nitroGLYCERIN, sodium chloride flush   Labs   Results for orders placed or performed during the hospital encounter of 05/10/22 (from the past 48 hour(s))  Hepatic function panel     Status: Abnormal   Collection Time: 05/10/22 12:19 PM  Result Value Ref Range   Total Protein 7.0 6.5 - 8.1 g/dL   Albumin 3.6 3.5 - 5.0 g/dL   AST 21 15 - 41 U/L   ALT 25 0 - 44 U/L   Alkaline Phosphatase 70 38 - 126 U/L   Total Bilirubin 0.4 0.3 - 1.2 mg/dL   Bilirubin, Direct 0.2 0.0 - 0.2 mg/dL   Indirect Bilirubin 0.2 (L) 0.3 - 0.9 mg/dL    Comment: Performed at [facility], [address] [place], [place]  Troponin I (High Sensitivity)  Status: Abnormal   Collection Time: 05/10/22 12:19 PM  Result Value Ref Range   Troponin I (High Sensitivity) 70 (H) <18 ng/L    Comment: (NOTE) Elevated high sensitivity troponin I (hsTnI) values and significant  changes across serial measurements may suggest ACS but many other  chronic and acute conditions are known to elevate hsTnI results.  Refer to the "Links" section for chest pain algorithms and additional  guidance. Performed at [facility], [address], [place], [place] [address]   SARS Coronavirus 2 by RT PCR (hospital order, performed in [facility] hospital lab) *cepheid single result test* Anterior Nasal Swab     Status: None   Collection Time: 05/10/22  1:47 PM   Specimen: Anterior Nasal Swab  Result Value Ref  Range   SARS Coronavirus 2 by RT PCR NEGATIVE NEGATIVE    Comment: (NOTE) SARS-CoV-2 target nucleic acids are NOT DETECTED.  The SARS-CoV-2 RNA is generally detectable in upper and lower respiratory specimens during the acute phase of infection. The lowest concentration of SARS-CoV-2 viral copies this assay can detect is 250 copies / mL. A negative result does not preclude SARS-CoV-2 infection and should not be used as the sole basis for treatment or other patient management decisions.  A negative result may occur with improper specimen collection / handling, submission of specimen other than nasopharyngeal swab, presence of viral mutation(s) within the areas targeted by this assay, and inadequate number of viral copies (<250 copies / mL). A negative result must be combined with clinical observations, patient history, and epidemiological information.  Fact Sheet for Patients:   RoadLapTop.co.za  Fact Sheet for Healthcare Providers: http://kim-miller.com/  This test is not yet approved or  cleared by the [place] FDA and has been authorized for detection and/or diagnosis of SARS-CoV-2 by FDA under an Emergency Use Authorization (EUA).  This EUA will remain in effect (meaning this test can be used) for the duration of the COVID-19 declaration under Section 564(b)(1) of the Act, 21 U.S.C. section 360bbb-3(b)(1), unless the authorization is terminated or revoked sooner.  Performed at [facility], [address], [place], [place] [address]   Rapid urine drug screen (hospital performed)     Status: None   Collection Time: 05/10/22  3:42 PM  Result Value Ref Range   Opiates NONE DETECTED NONE DETECTED   Cocaine NONE DETECTED NONE DETECTED   Benzodiazepines NONE DETECTED NONE DETECTED   Amphetamines NONE DETECTED NONE DETECTED   Tetrahydrocannabinol NONE DETECTED NONE DETECTED   Barbiturates NONE DETECTED NONE DETECTED     Comment: (NOTE) DRUG SCREEN FOR MEDICAL PURPOSES ONLY.  IF CONFIRMATION IS NEEDED FOR ANY PURPOSE, NOTIFY LAB WITHIN 5 DAYS.  LOWEST DETECTABLE LIMITS FOR URINE DRUG SCREEN Drug Class                     Cutoff (ng/mL) Amphetamine and metabolites    1000 Barbiturate and metabolites    200 Benzodiazepine                 200 Tricyclics and metabolites     300 Opiates and metabolites        300 Cocaine and metabolites        300 THC                            50 Performed at [facility], [address], [place], [place]  [address]   Magnesium     Status: None   Collection Time: 05/10/22  4:30 PM  Result Value Ref Range   Magnesium 1.9 1.7 - 2.4 mg/dL    Comment: Performed at [facility], [address] [place], [place] [address]  TSH     Status: None   Collection Time: 05/10/22  4:30 PM  Result Value Ref Range   TSH 0.643 0.350 - 4.500 uIU/mL    Comment: Performed by a 3rd Generation assay with a functional sensitivity of <=0.01 uIU/mL. Performed at [facility], [address], [place], [place] [address]   HIV Antibody (routine testing w rflx)     Status: None   Collection Time: 05/10/22  4:30 PM  Result Value Ref Range   HIV Screen 4th Generation wRfx Non Reactive Non Reactive    Comment: Performed at [facility], [address], [place], [place] [address]  Basic metabolic panel     Status: Abnormal   Collection Time: 05/11/22  3:09 AM  Result Value Ref Range   Sodium 137 135 - 145 mmol/L   Potassium 3.1 (L) 3.5 - 5.1 mmol/L   Chloride 103 98 - 111 mmol/L   CO2 26 22 - 32 mmol/L   Glucose, Bld 110 (H) 70 - 99 mg/dL    Comment: Glucose reference range applies only to samples taken after fasting for at least 8 hours.   BUN 10 6 - 20 mg/dL   Creatinine, Ser 4.50 0.61 - 1.24 mg/dL   Calcium 8.8 (L) 8.9 - 10.3 mg/dL   GFR, Estimated >38 >88 mL/min    Comment: (NOTE) Calculated using the CKD-EPI Creatinine Equation (2021)    Anion gap 8 5 - 15     Comment: Performed at [facility], [address] [place], [place] [address]  Lipid panel     Status: Abnormal   Collection Time: 05/11/22  3:09 AM  Result Value Ref Range   Cholesterol 130 0 - 200 mg/dL   Triglycerides 491 <791 mg/dL   HDL 31 (L) >50 mg/dL   Total CHOL/HDL Ratio 4.2 RATIO   VLDL 20 0 - 40 mg/dL   LDL Cholesterol 79 0 - 99 mg/dL    Comment:        Total Cholesterol/HDL:CHD Risk Coronary Heart Disease Risk Table                     Men   Women  1/2 Average Risk   3.4   3.3  Average Risk       5.0   4.4  2 X Average Risk   9.6   7.1  3 X Average Risk  23.4   11.0        Use the calculated Patient Ratio above and the CHD Risk Table to determine the patient's CHD Risk.        ATP III CLASSIFICATION (LDL):  <100     mg/dL   Optimal  569-794  mg/dL   Near or Above                    Optimal  130-159  mg/dL   Borderline  801-655  mg/dL   High  >374     mg/dL   Very High Performed at [facility], [address] [place], [place] [address]   CBC     Status: None   Collection Time: 05/11/22  3:09 AM  Result Value Ref Range   WBC 7.6 4.0 - 10.5 K/uL   RBC 5.69 4.22 - 5.81 MIL/uL   Hemoglobin 16.1 13.0 - 17.0 g/dL   HCT 23.3 43.5 - 68.6 %   MCV 84.4 80.0 - 100.0 fL   MCH 28.3 26.0 - 34.0 pg   MCHC 33.5 30.0 - 36.0 g/dL   RDW 16.8 37.2 - 90.2 %   Platelets 245 150 - 400 K/uL   nRBC 0.0 0.0 - 0.2 %    Comment: Performed at [facility], [address] [place], [place] [address]  Hemoglobin A1c     Status: None   Collection Time: 05/11/22  3:09 AM  Result Value Ref Range   Hgb A1c MFr Bld 5.6 4.8 - 5.6 %    Comment: (NOTE) Pre diabetes:          5.7%-6.4%  Diabetes:              >6.4%  Glycemic control for   <7.0% adults with diabetes    Mean Plasma Glucose 114.02 mg/dL    Comment: Performed at [facility], [address] [place], [place] [address]  Magnesium     Status: None   Collection Time: 05/11/22  3:09 AM  Result  Value Ref Range   Magnesium 2.0 1.7 - 2.4 mg/dL    Comment: Performed at [facility], [address], [place], [place] [address]  CBC     Status: Abnormal   Collection Time: 05/12/22  2:48 AM  Result Value Ref Range   WBC 10.1 4.0 - 10.5 K/uL   RBC 5.89 (H) 4.22 - 5.81 MIL/uL   Hemoglobin 16.9 13.0 - 17.0 g/dL   HCT 22.4 49.7 - 53.0 %   MCV 84.4 80.0 - 100.0 fL   MCH 28.7 26.0 - 34.0 pg   MCHC 34.0 30.0 - 36.0 g/dL   RDW 05.1 10.2 - 11.1 %   Platelets 248 150 - 400 K/uL   nRBC 0.0 0.0 - 0.2 %    Comment: Performed at [facility], [address] [place], [place] [address]  Basic metabolic panel     Status: Abnormal   Collection Time: 05/12/22  2:48 AM  Result Value Ref Range   Sodium 140 135 - 145 mmol/L   Potassium 3.4 (L) 3.5 - 5.1 mmol/L   Chloride 107 98 - 111 mmol/L   CO2 25 22 - 32 mmol/L   Glucose, Bld 115 (H) 70 - 99 mg/dL    Comment: Glucose reference range applies only to samples taken after fasting for at least 8 hours.   BUN 15 6 - 20 mg/dL   Creatinine, Ser 0.14 0.61 - 1.24 mg/dL   Calcium 9.2 8.9 - 10.3 mg/dL   GFR, Estimated >01 >31 mL/min    Comment: (NOTE) Calculated using the CKD-EPI Creatinine Equation (2021)    Anion gap 8 5 - 15    Comment: Performed at [facility], [address] [place], [place] [address]  Magnesium     Status: None   Collection Time: 05/12/22  2:48 AM  Result Value Ref Range   Magnesium 2.0 1.7 - 2.4 mg/dL    Comment: Performed at [facility], [address], [place], [place] [address]  Phosphorus     Status: None   Collection Time: 05/12/22  2:48 AM  Result Value Ref Range   Phosphorus 4.3 2.5 - 4.6  mg/dL    Comment: Performed at [facility], [address] [place], [place] [address]    ECG   N/A  Telemetry   Sinus rhythm with PVC's- Personally Reviewed  Radiology    DG Chest 2 View  Result Date: 05/10/2022 CLINICAL DATA:  Shortness of breath beginning couple weeks ago, coughing  for months, chest pain since last week, history hypertension EXAM: CHEST - 2 VIEW COMPARISON:  222 20,019 FINDINGS: Enlargement of cardiac silhouette. Mediastinal contours and pulmonary vascularity normal. RIGHT basilar interstitial infiltrate. Remaining lungs clear. No pleural effusion or pneumothorax. IMPRESSION: Enlargement of cardiac silhouette. RIGHT basilar is tissue infiltrate question pneumonia. Electronically Signed   By: Ulyses Southward M.D.   On: 05/10/2022 10:38   ECHOCARDIOGRAM COMPLETE  Result Date: 05/11/2022    ECHOCARDIOGRAM REPORT   Patient Name:   [facility] Date of Exam: 05/10/2022 Medical Rec #:  [id number]   Height:       73.0 in Accession #:    [id number]  Weight:       346.1 lb Date of Birth:  [birth year]   BSA:          2.716 m Patient Age:    40 years    BP:           149/105 mmHg Patient Gender: M           HR:           92 bpm. Exam Location:  Inpatient Procedure: 2D Echo, Cardiac Doppler and Color Doppler Indications:    Dyspnea  History:        Patient has no prior history of Echocardiogram examinations.                 Risk Factors:Hypertension.  Sonographer:    Eduard Roux Referring Phys: [id number] DAYNA N DUNN IMPRESSIONS  1. Left ventricular ejection fraction, by estimation, is 20 to 25%. The left ventricle has severely decreased function. The left ventricle demonstrates global hypokinesis, worse anteriorly. The left ventricular internal cavity size was severely dilated.  There is moderate left ventricular hypertrophy. Left ventricular diastolic parameters are consistent with Grade II diastolic dysfunction (pseudonormalization). Elevated left ventricular end-diastolic pressure.  2. Right ventricular systolic function is low normal. The right ventricular size is normal. There is normal pulmonary artery systolic pressure. The estimated right ventricular systolic pressure is 33.9 mmHg.  3. The pericardial effusion is circumferential.  4. The mitral valve is abnormal. Mild mitral valve  regurgitation.  5. The aortic valve is tricuspid. Aortic valve regurgitation is not visualized.  6. The inferior vena cava is normal in size with greater than 50% respiratory variability, suggesting right atrial pressure of 3 mmHg. Comparison(s): No prior Echocardiogram. FINDINGS  Left Ventricle: Left ventricular ejection fraction, by estimation, is 20 to 25%. The left ventricle has severely decreased function. The left ventricle demonstrates global hypokinesis. The left ventricular internal cavity size was severely dilated. There is moderate left ventricular hypertrophy. Left ventricular diastolic parameters are consistent with Grade II diastolic dysfunction (pseudonormalization). Elevated left ventricular end-diastolic pressure. Right Ventricle: The right ventricular size is normal. No increase in right ventricular wall thickness. Right ventricular systolic function is low normal. There is normal pulmonary artery systolic pressure. The tricuspid regurgitant velocity is 2.78 m/s,  and with an assumed right atrial pressure of 3 mmHg, the estimated right ventricular systolic pressure is 33.9 mmHg. Left Atrium: Left atrial size was normal in size. Right Atrium: Right atrial size was normal in  size. Pericardium: Trivial pericardial effusion is present. The pericardial effusion is circumferential. Mitral Valve: The mitral valve is abnormal. There is mild thickening of the anterior and posterior mitral valve leaflet(s). Mild mitral valve regurgitation. Tricuspid Valve: The tricuspid valve is grossly normal. Tricuspid valve regurgitation is mild. Aortic Valve: The aortic valve is tricuspid. Aortic valve regurgitation is not visualized. Aortic valve peak gradient measures 5.9 mmHg. Pulmonic Valve: The pulmonic valve was normal in structure. Pulmonic valve regurgitation is not visualized. Aorta: The aortic root and ascending aorta are structurally normal, with no evidence of dilitation. Venous: The inferior vena cava is  normal in size with greater than 50% respiratory variability, suggesting right atrial pressure of 3 mmHg. IAS/Shunts: No atrial level shunt detected by color flow Doppler.  LEFT VENTRICLE PLAX 2D LVIDd:         6.80 cm LVIDs:         6.20 cm LV PW:         1.50 cm LV IVS:        1.40 cm LVOT diam:     2.40 cm LV SV:         86 LV SV Index:   31 LVOT Area:     4.52 cm  IVC IVC diam: 1.80 cm LEFT ATRIUM             Index        RIGHT ATRIUM           Index LA diam:        5.80 cm 2.14 cm/m   RA Area:     14.00 cm LA Vol (A2C):   85.8 ml 31.59 ml/m  RA Volume:   29.70 ml  10.93 ml/m LA Vol (A4C):   75.2 ml 27.68 ml/m LA Biplane Vol: 82.6 ml 30.41 ml/m  AORTIC VALVE                 PULMONIC VALVE AV Area (Vmax): 4.30 cm     PV Vmax:       0.73 m/s AV Vmax:        121.00 cm/s  PV Peak grad:  2.1 mmHg AV Peak Grad:   5.9 mmHg LVOT Vmax:      115.00 cm/s LVOT Vmean:     64.000 cm/s LVOT VTI:       0.189 m  AORTA Ao Root diam: 3.60 cm Ao Asc diam:  3.60 cm MITRAL VALVE               TRICUSPID VALVE MV Area (PHT): 6.48 cm    TR Peak grad:   30.9 mmHg MV Decel Time: 117 msec    TR Vmax:        278.00 cm/s MV E velocity: 99.00 cm/s MV A velocity: 62.60 cm/s  SHUNTS MV E/A ratio:  1.58        Systemic VTI:  0.19 m                            Systemic Diam: 2.40 cm Zoila Shutter MD Electronically signed by Zoila Shutter MD Signature Date/Time: 05/11/2022/10:42:22 AM    Final     Cardiac Studies   See echo above  Assessment   Principal Problem:   Chest pain of uncertain etiology Active Problems:   Essential hypertension   Hyperglycemia   Tobacco use   Class 3 severe obesity due to excess calories with serious comorbidity and body mass  index (BMI) of 40.0 to 44.9 in adult [facility])   History of medication noncompliance   Polysubstance abuse [facility])   Plan   Mr. Massi reports his breathing is better today.  Although he is kept his urine, strict ins and outs and daily weights have not been recorded.  I ordered  those today.  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  This could be related to volume overload.  Nonetheless he has multiple risk factors for coronary disease and should have both left and right heart catheterization.  We are currently aiming for Tuesday or Wednesday of this week.  Continue diuresis.  Blood pressure appears near optimally controlled at this point.  Mild hypokalemia 3.4-replete. Slight increase in creatinine - monitor, may need to decrease lasix.  Time Spent Directly with Patient:  I have spent a total of 25 minutes with the patient reviewing hospital notes, telemetry, EKGs, labs and examining the patient as well as establishing an assessment and plan that was discussed personally with the patient.  > 50% of time was spent in direct patient care.  Length of Stay:  LOS: 2 days   Chrystie Nose, MD, [facility], FACP  [place]  [facility]  Medical Director of the [facility] Lipid Disorders &  Cardiovascular Risk Reduction Clinic Diplomate of the American Board of Clinical Lipidology Attending Cardiologist  Direct Dial: [phone]  Fax: [phone]  Website:  [email or web address]  Lisette Abu Minard Millirons 05/12/2022, 10:22 AM

## 2022-05-12 NOTE — Progress Notes (Signed)
PROGRESS NOTE    Henry Chandler  WNU:[id number] DOB: 02/25/1981 DOA: 05/10/2022 PCP: Pcp, No   Brief Narrative:  This 41 years old male with PMH significant for back pain, EtOH dependence, hypertension, morbid obesity and tobacco dependence presented in the ED with c/o: chest pain and shortness of breath. Patient reports having cough for about a month ago and for last 1 week he has developed shortness of breath.  He also noticed some chest tightness that radiates towards the back.  Patient has been using his regular inhalers without any relief.  Pain is associated with exertion.  Patient has been noncompliant with his medications. ED work-up shows elevated troponins.  Cardiology is consulted planning for ischemic work-up next week.  Assessment & Plan:   Principal Problem:   Chest pain of uncertain etiology Active Problems:   Essential hypertension   Hyperglycemia   Tobacco use   Class 3 severe obesity due to excess calories with serious comorbidity and body mass index (BMI) of 40.0 to 44.9 in adult [facility])   History of medication noncompliance   Polysubstance abuse (HCC)   Chest pain: Suspected ACS. Patient presented with substernal chest pain, shortness of breath for 1 week.   Pain worsens with exertion gets better with rest.  Appears like angina. This is in the setting of medication noncompliance, not on any antihypertensive medication. EKG abnormal ST-T wave changes, troponin 63> 70, BNP 536. Continue aspirin, statins. Echocardiogram shows EF 20 to 25% consistent with cardiomyopathy. Cardiology recommended ischemic work-up. Patient will have right and left heart cath next week likely Tuesday.  Essential hypertension: Continue Avapro, hydralazine as needed. Continue to monitor blood pressure  Hyperlipidemia: Continue Lipitor  Polysubstance abuse: Patient acknowledges being drinking on weekends and periodic THC use Cessation encouraged.  Urine drug screen negative  Tobacco  dependence: Counseling completed.  Patient not interested. Refuses nicotine patch  Morbid Obesity: Diet and exercise discussed in detail.  Weight loss encouraged.   DVT prophylaxis: Lovenox Code Status: Full code Family Communication: Wife at bedside Disposition Plan:   Status is: Inpatient Remains inpatient appropriate because:   Admitted for chest pain, simulating angina.  Cardiology is consulted, managing his ACS.   Patient is scheduled to have right and left heart cath early next week   Consultants:  Cardiology  Procedures: Echocardiogram Antimicrobials: None  Subjective: Patient was seen and examined at bedside.  Overnight events noted.   Patient reports chest pain is improved.  He denies any shortness of breath.  Blood pressure is also improving.  Objective: Vitals:   05/12/22 0825 05/12/22 0839 05/12/22 1300 05/12/22 1342  BP: 122/87 (!) 132/98  (!) 156/109  Pulse: 86   98  Resp: 16   16  Temp: 98.2 F (36.8 C)     TempSrc: Oral     SpO2: 94%   95%  Weight:   (!) 152.4 kg   Height:        Intake/Output Summary (Last 24 hours) at 05/12/2022 1405 Last data filed at 05/12/2022 1300 Gross per 24 hour  Intake 580 ml  Output 1201 ml  Net -621 ml   Filed Weights   05/10/22 1015 05/10/22 1658 05/12/22 1300  Weight: (!) 157 kg (!) 153.2 kg (!) 152.4 kg    Examination:  General exam: Appears comfortable, not in any acute distress. Respiratory system: CTA bilaterally, respiratory effort normal, RR 16 Cardiovascular system: S1-S2 heard, regular rate and rhythm, no murmur. Gastrointestinal system: Abdomen is soft, non tender, non distended, BS+  Central nervous system: Alert and oriented x 3 . No focal neurological deficits. Extremities: No edema, no cyanosis, no clubbing. Skin: No rashes, lesions or ulcers Psychiatry: Judgement and insight appear normal. Mood & affect appropriate.     Data Reviewed: I have personally reviewed following labs and imaging  studies  CBC: Recent Labs  Lab 05/10/22 1022 05/11/22 0309 05/12/22 0248  WBC 6.8 7.6 10.1  HGB 16.5 16.1 16.9  HCT 48.7 48.0 49.7  MCV 84.5 84.4 84.4  PLT 270 245 248   Basic Metabolic Panel: Recent Labs  Lab 05/10/22 1022 05/10/22 1630 05/11/22 0309 05/12/22 0248  NA 139  --  137 140  K 3.0*  --  3.1* 3.4*  CL 105  --  103 107  CO2 26  --  26 25  GLUCOSE 146*  --  110* 115*  BUN 9  --  10 15  CREATININE 1.08  --  1.10 1.19  CALCIUM 8.9  --  8.8* 9.2  MG  --  1.9 2.0 2.0  PHOS  --   --   --  4.3   GFR: Estimated Creatinine Clearance: 128.4 mL/min (by C-G formula based on SCr of 1.19 mg/dL). Liver Function Tests: Recent Labs  Lab 05/10/22 1219  AST 21  ALT 25  ALKPHOS 70  BILITOT 0.4  PROT 7.0  ALBUMIN 3.6   No results for input(s): LIPASE, AMYLASE in the last 168 hours. No results for input(s): AMMONIA in the last 168 hours. Coagulation Profile: No results for input(s): INR, PROTIME in the last 168 hours. Cardiac Enzymes: No results for input(s): CKTOTAL, CKMB, CKMBINDEX, TROPONINI in the last 168 hours. BNP (last 3 results) No results for input(s): PROBNP in the last 8760 hours. HbA1C: Recent Labs    05/11/22 0309  HGBA1C 5.6   CBG: No results for input(s): GLUCAP in the last 168 hours. Lipid Profile: Recent Labs    05/11/22 0309  CHOL 130  HDL 31*  LDLCALC 79  TRIG 409  CHOLHDL 4.2   Thyroid Function Tests: Recent Labs    05/10/22 1630  TSH 0.643   Anemia Panel: No results for input(s): VITAMINB12, FOLATE, FERRITIN, TIBC, IRON, RETICCTPCT in the last 72 hours. Sepsis Labs: No results for input(s): PROCALCITON, LATICACIDVEN in the last 168 hours.  Recent Results (from the past 240 hour(s))  SARS Coronavirus 2 by RT PCR (hospital order, performed in [facility] hospital lab) *cepheid single result test* Anterior Nasal Swab     Status: None   Collection Time: 05/10/22  1:47 PM   Specimen: Anterior Nasal Swab  Result Value Ref Range  Status   SARS Coronavirus 2 by RT PCR NEGATIVE NEGATIVE Final    Comment: (NOTE) SARS-CoV-2 target nucleic acids are NOT DETECTED.  The SARS-CoV-2 RNA is generally detectable in upper and lower respiratory specimens during the acute phase of infection. The lowest concentration of SARS-CoV-2 viral copies this assay can detect is 250 copies / mL. A negative result does not preclude SARS-CoV-2 infection and should not be used as the sole basis for treatment or other patient management decisions.  A negative result may occur with improper specimen collection / handling, submission of specimen other than nasopharyngeal swab, presence of viral mutation(s) within the areas targeted by this assay, and inadequate number of viral copies (<250 copies / mL). A negative result must be combined with clinical observations, patient history, and epidemiological information.  Fact Sheet for Patients:   RoadLapTop.co.za  Fact Sheet for Healthcare  Providers: http://kim-miller.com/  This test is not yet approved or  cleared by the [place] and has been authorized for detection and/or diagnosis of SARS-CoV-2 by FDA under an Emergency Use Authorization (EUA).  This EUA will remain in effect (meaning this test can be used) for the duration of the COVID-19 declaration under Section 564(b)(1) of the Act, 21 U.S.C. section 360bbb-3(b)(1), unless the authorization is terminated or revoked sooner.  Performed at [facility], [address], [place], [place] [address]     Radiology Studies: ECHOCARDIOGRAM COMPLETE  Result Date: 05/11/2022    ECHOCARDIOGRAM REPORT   Patient Name:   [facility] Date of Exam: 05/10/2022 Medical Rec #:  [id number]   Height:       73.0 in Accession #:    [id number]  Weight:       346.1 lb Date of Birth:  [birth year]   BSA:          2.716 m Patient Age:    40 years    BP:           149/105 mmHg Patient Gender: M           HR:            92 bpm. Exam Location:  Inpatient Procedure: 2D Echo, Cardiac Doppler and Color Doppler Indications:    Dyspnea  History:        Patient has no prior history of Echocardiogram examinations.                 Risk Factors:Hypertension.  Sonographer:    Eduard Roux Referring Phys: [id number] DAYNA N DUNN IMPRESSIONS  1. Left ventricular ejection fraction, by estimation, is 20 to 25%. The left ventricle has severely decreased function. The left ventricle demonstrates global hypokinesis, worse anteriorly. The left ventricular internal cavity size was severely dilated.  There is moderate left ventricular hypertrophy. Left ventricular diastolic parameters are consistent with Grade II diastolic dysfunction (pseudonormalization). Elevated left ventricular end-diastolic pressure.  2. Right ventricular systolic function is low normal. The right ventricular size is normal. There is normal pulmonary artery systolic pressure. The estimated right ventricular systolic pressure is 33.9 mmHg.  3. The pericardial effusion is circumferential.  4. The mitral valve is abnormal. Mild mitral valve regurgitation.  5. The aortic valve is tricuspid. Aortic valve regurgitation is not visualized.  6. The inferior vena cava is normal in size with greater than 50% respiratory variability, suggesting right atrial pressure of 3 mmHg. Comparison(s): No prior Echocardiogram. FINDINGS  Left Ventricle: Left ventricular ejection fraction, by estimation, is 20 to 25%. The left ventricle has severely decreased function. The left ventricle demonstrates global hypokinesis. The left ventricular internal cavity size was severely dilated. There is moderate left ventricular hypertrophy. Left ventricular diastolic parameters are consistent with Grade II diastolic dysfunction (pseudonormalization). Elevated left ventricular end-diastolic pressure. Right Ventricle: The right ventricular size is normal. No increase in right ventricular wall thickness. Right  ventricular systolic function is low normal. There is normal pulmonary artery systolic pressure. The tricuspid regurgitant velocity is 2.78 m/s,  and with an assumed right atrial pressure of 3 mmHg, the estimated right ventricular systolic pressure is 33.9 mmHg. Left Atrium: Left atrial size was normal in size. Right Atrium: Right atrial size was normal in size. Pericardium: Trivial pericardial effusion is present. The pericardial effusion is circumferential. Mitral Valve: The mitral valve is abnormal. There is mild thickening of the anterior and posterior mitral valve leaflet(s). Mild mitral valve regurgitation.  Tricuspid Valve: The tricuspid valve is grossly normal. Tricuspid valve regurgitation is mild. Aortic Valve: The aortic valve is tricuspid. Aortic valve regurgitation is not visualized. Aortic valve peak gradient measures 5.9 mmHg. Pulmonic Valve: The pulmonic valve was normal in structure. Pulmonic valve regurgitation is not visualized. Aorta: The aortic root and ascending aorta are structurally normal, with no evidence of dilitation. Venous: The inferior vena cava is normal in size with greater than 50% respiratory variability, suggesting right atrial pressure of 3 mmHg. IAS/Shunts: No atrial level shunt detected by color flow Doppler.  LEFT VENTRICLE PLAX 2D LVIDd:         6.80 cm LVIDs:         6.20 cm LV PW:         1.50 cm LV IVS:        1.40 cm LVOT diam:     2.40 cm LV SV:         86 LV SV Index:   31 LVOT Area:     4.52 cm  IVC IVC diam: 1.80 cm LEFT ATRIUM             Index        RIGHT ATRIUM           Index LA diam:        5.80 cm 2.14 cm/m   RA Area:     14.00 cm LA Vol (A2C):   85.8 ml 31.59 ml/m  RA Volume:   29.70 ml  10.93 ml/m LA Vol (A4C):   75.2 ml 27.68 ml/m LA Biplane Vol: 82.6 ml 30.41 ml/m  AORTIC VALVE                 PULMONIC VALVE AV Area (Vmax): 4.30 cm     PV Vmax:       0.73 m/s AV Vmax:        121.00 cm/s  PV Peak grad:  2.1 mmHg AV Peak Grad:   5.9 mmHg LVOT Vmax:       115.00 cm/s LVOT Vmean:     64.000 cm/s LVOT VTI:       0.189 m  AORTA Ao Root diam: 3.60 cm Ao Asc diam:  3.60 cm MITRAL VALVE               TRICUSPID VALVE MV Area (PHT): 6.48 cm    TR Peak grad:   30.9 mmHg MV Decel Time: 117 msec    TR Vmax:        278.00 cm/s MV E velocity: 99.00 cm/s MV A velocity: 62.60 cm/s  SHUNTS MV E/A ratio:  1.58        Systemic VTI:  0.19 m                            Systemic Diam: 2.40 cm Zoila Shutter MD Electronically signed by Zoila Shutter MD Signature Date/Time: 05/11/2022/10:42:22 AM    Final     Scheduled Meds:  aspirin EC  81 mg Oral Daily   atorvastatin  40 mg Oral q1800   enoxaparin (LOVENOX) injection  80 mg Subcutaneous Q24H   furosemide  40 mg Intravenous BID   irbesartan  300 mg Oral Daily   potassium chloride  40 mEq Oral Daily   sodium chloride flush  3 mL Intravenous Q12H   sodium chloride flush  3 mL Intravenous Q12H   spironolactone  25 mg Oral Daily   Continuous  Infusions:  sodium chloride       LOS: 2 days    Time spent: 35 mins    Denisia Harpole, MD [facility]   If 7PM-7AM, please contact night-coverage

## 2022-05-13 DIAGNOSIS — R079 Chest pain, unspecified: Secondary | ICD-10-CM | POA: Diagnosis not present

## 2022-05-13 DIAGNOSIS — Z6841 Body Mass Index (BMI) 40.0 and over, adult: Secondary | ICD-10-CM | POA: Diagnosis not present

## 2022-05-13 DIAGNOSIS — I5021 Acute systolic (congestive) heart failure: Secondary | ICD-10-CM | POA: Diagnosis not present

## 2022-05-13 LAB — LIPOPROTEIN A (LPA): Lipoprotein (a): 106 nmol/L — ABNORMAL HIGH (ref <75.0)

## 2022-05-13 MED ORDER — FUROSEMIDE 40 MG PO TABS
40.0000 mg | ORAL_TABLET | Freq: Every day | ORAL | Status: DC
Start: 2022-05-13 — End: 2022-05-15
  Administered 2022-05-13 – 2022-05-15 (×3): 40 mg via ORAL
  Filled 2022-05-13 (×3): qty 1

## 2022-05-13 MED ORDER — POTASSIUM CHLORIDE 20 MEQ PO PACK: 40.0000 meq | PACK | Freq: Once | ORAL | Status: AC

## 2022-05-13 MED ORDER — SODIUM CHLORIDE 0.9 % IV SOLN: INTRAVENOUS | Status: DC

## 2022-05-13 MED ORDER — ASPIRIN 81 MG PO CHEW
81.0000 mg | CHEWABLE_TABLET | ORAL | Status: AC
  Administered 2022-05-14: 81 mg via ORAL
  Filled 2022-05-13: qty 1

## 2022-05-13 MED ORDER — SODIUM CHLORIDE 0.9 % IV SOLN: 250.0000 mL | INTRAVENOUS | Status: DC | PRN

## 2022-05-13 MED ORDER — SODIUM CHLORIDE 0.9% FLUSH: 3.0000 mL | INTRAVENOUS | Status: DC | PRN

## 2022-05-13 NOTE — Progress Notes (Signed)
PROGRESS NOTE    Henry Chandler  [id number] DOB: Jun 11, 1981 DOA: 05/10/2022 PCP: Pcp, No   Brief Narrative:  This 41 years old male with PMH significant for back pain, EtOH dependence, hypertension, morbid obesity and tobacco dependence presented in the ED with c/o: chest pain and shortness of breath. Patient reports having cough for about a month ago and for last 1 week he has developed shortness of breath.  He also noticed some chest tightness that radiates towards the back.  Patient has been using his regular inhalers without any relief.  Pain is associated with exertion.  Patient has been noncompliant with his medications. ED work-up shows elevated troponins.  Cardiology is consulted , scheduled for left heart cath tomorrow. Assessment & Plan:   Principal Problem:   Chest pain of uncertain etiology Active Problems:   Essential hypertension   Hyperglycemia   Tobacco use   Class 3 severe obesity due to excess calories with serious comorbidity and body mass index (BMI) of 40.0 to 44.9 in adult [facility])   History of medication noncompliance   Polysubstance abuse ([place])   Chest pain: Suspected ACS. Patient presented with substernal chest pain, shortness of breath for 1 week.   Pain worsens with exertion,  gets better with rest.  Appears like angina. This is in the setting of medication noncompliance, not on any antihypertensive medications. EKG abnormal ST-T wave changes, troponin 63 > 70, BNP 536. Continue aspirin, statins.  He reports chest pain has improved. Echocardiogram shows EF 20 to 25% consistent with cardiomyopathy. Cardiology recommended ischemic work-up. Patient will have right and left heart cath  tomorrow.  Essential hypertension: Continue Avapro 300 mg daily, hydralazine as needed. Hydralazine 25 mg PO 3 times daily added for better blood pressure control. Continue to monitor blood pressure.  Hyperlipidemia: Continue Lipitor 40 mg daily.  Polysubstance  abuse: Patient acknowledges being drinking on weekends and periodic THC use Cessation encouraged.  Urine drug screen negative.  Tobacco dependence: Counseling completed.  Patient not interested. Refuses nicotine patch  Morbid Obesity: Diet and exercise discussed in detail.  Weight loss encouraged.  Estimated body mass index is 44.59 kg/m as calculated from the following:   Height as of this encounter: 6\' 1"  (1.854 m).   Weight as of this encounter: 153.3 kg.    DVT prophylaxis: Lovenox Code Status: Full code Family Communication: Wife at bedside Disposition Plan:   Status is: Inpatient Remains inpatient appropriate because:   Admitted for chest pain, simulating angina.  Cardiology is consulted, managing his ACS.   Patient is scheduled to have right and left heart cath tomorrow.   Consultants:  Cardiology  Procedures: Echocardiogram Antimicrobials: None  Subjective: Patient was seen and examined at bedside.  Overnight events noted.   Patient reports chest pain has improved. He denies any shortness of breath.  Blood pressure is also improving. Patient is scheduled for left heart cath tomorrow.  Objective: Vitals:   05/12/22 1800 05/12/22 1950 05/13/22 0530 05/13/22 0746  BP: (!) 148/110 (!) 151/86 118/83 127/87  Pulse: (!) 109 (!) 106 (!) 104 100  Resp: 16 18 18 16   Temp:  98.4 F (36.9 C) 97.9 F (36.6 C) 98 F (36.7 C)  TempSrc:  Oral Oral Oral  SpO2: 95% 97% 98% 96%  Weight:   (!) 153.3 kg   Height:        Intake/Output Summary (Last 24 hours) at 05/13/2022 1134 Last data filed at 05/13/2022 0935 Gross per 24 hour  Intake 1880 ml  Output 951 ml  Net 929 ml   Filed Weights   05/10/22 1658 05/12/22 1300 05/13/22 0530  Weight: (!) 153.2 kg (!) 152.4 kg (!) 153.3 kg    Examination:  General exam: Appears comfortable, not in any acute distress. Respiratory system: CTA bilaterally, normal respiratory effort.  RR 16 Cardiovascular system: S1-S2 heard,  regular rate and rhythm, no murmur. Gastrointestinal system: Abdomen is soft, non tender, non distended, BS+ Central nervous system: Alert and oriented x 3 . No focal neurological deficits. Extremities: No edema, no cyanosis, no clubbing. Skin: No rashes, lesions or ulcers Psychiatry: Judgement and insight appear normal. Mood & affect appropriate.     Data Reviewed: I have personally reviewed following labs and imaging studies  CBC: Recent Labs  Lab 05/10/22 1022 05/11/22 0309 05/12/22 0248  WBC 6.8 7.6 10.1  HGB 16.5 16.1 16.9  HCT 48.7 48.0 49.7  MCV 84.5 84.4 84.4  PLT 270 245 [id number]   Basic Metabolic Panel: Recent Labs  Lab 05/10/22 1022 05/10/22 1630 05/11/22 0309 05/12/22 0248  NA 139  --  137 140  K 3.0*  --  3.1* 3.4*  CL 105  --  103 107  CO2 26  --  26 25  GLUCOSE 146*  --  110* 115*  BUN 9  --  10 15  CREATININE 1.08  --  1.10 1.19  CALCIUM 8.9  --  8.8* 9.2  MG  --  1.9 2.0 2.0  PHOS  --   --   --  4.3   GFR: Estimated Creatinine Clearance: 128.8 mL/min (by C-G formula based on SCr of 1.19 mg/dL). Liver Function Tests: Recent Labs  Lab 05/10/22 1219  AST 21  ALT 25  ALKPHOS 70  BILITOT 0.4  PROT 7.0  ALBUMIN 3.6   No results for input(s): LIPASE, AMYLASE in the last 168 hours. No results for input(s): AMMONIA in the last 168 hours. Coagulation Profile: No results for input(s): INR, PROTIME in the last 168 hours. Cardiac Enzymes: No results for input(s): CKTOTAL, CKMB, CKMBINDEX, TROPONINI in the last 168 hours. BNP (last 3 results) No results for input(s): PROBNP in the last 8760 hours. HbA1C: Recent Labs    05/11/22 0309  HGBA1C 5.6   CBG: No results for input(s): GLUCAP in the last 168 hours. Lipid Profile: Recent Labs    05/11/22 0309  CHOL 130  HDL 31*  LDLCALC 79  TRIG 102  CHOLHDL 4.2   Thyroid Function Tests: Recent Labs    05/10/22 1630  TSH 0.643   Anemia Panel: No results for input(s): VITAMINB12, FOLATE,  FERRITIN, TIBC, IRON, RETICCTPCT in the last 72 hours. Sepsis Labs: No results for input(s): PROCALCITON, LATICACIDVEN in the last 168 hours.  Recent Results (from the past 240 hour(s))  SARS Coronavirus 2 by RT PCR (hospital order, performed in [facility] hospital lab) *cepheid single result test* Anterior Nasal Swab     Status: None   Collection Time: 05/10/22  1:47 PM   Specimen: Anterior Nasal Swab  Result Value Ref Range Status   SARS Coronavirus 2 by RT PCR NEGATIVE NEGATIVE Final    Comment: (NOTE) SARS-CoV-2 target nucleic acids are NOT DETECTED.  The SARS-CoV-2 RNA is generally detectable in upper and lower respiratory specimens during the acute phase of infection. The lowest concentration of SARS-CoV-2 viral copies this assay can detect is 250 copies / mL. A negative result does not preclude SARS-CoV-2 infection and should not be used as the sole basis  for treatment or other patient management decisions.  A negative result may occur with improper specimen collection / handling, submission of specimen other than nasopharyngeal swab, presence of viral mutation(s) within the areas targeted by this assay, and inadequate number of viral copies (<250 copies / mL). A negative result must be combined with clinical observations, patient history, and epidemiological information.  Fact Sheet for Patients:   https://www.patel.info/  Fact Sheet for Healthcare Providers: https://hall.com/  This test is not yet approved or  cleared by the [place] FDA and has been authorized for detection and/or diagnosis of SARS-CoV-2 by FDA under an Emergency Use Authorization (EUA).  This EUA will remain in effect (meaning this test can be used) for the duration of the COVID-19 declaration under Section 564(b)(1) of the Act, 21 U.S.C. section 360bbb-3(b)(1), unless the authorization is terminated or revoked sooner.  Performed at [place], [place] [address], [place], [place] [address]     Radiology Studies: No results found.  Scheduled Meds:  aspirin EC  81 mg Oral Daily   atorvastatin  40 mg Oral q1800   enoxaparin (LOVENOX) injection  80 mg Subcutaneous Q24H   furosemide  40 mg Oral Daily   hydrALAZINE  25 mg Oral TID   irbesartan  300 mg Oral Daily   potassium chloride  40 mEq Oral Daily   sodium chloride flush  3 mL Intravenous Q12H   sodium chloride flush  3 mL Intravenous Q12H   spironolactone  25 mg Oral Daily   Continuous Infusions:  sodium chloride       LOS: 3 days    Time spent: 35 mins    Audry Pecina, MD [facility]   If 7PM-7AM, please contact night-coverage

## 2022-05-13 NOTE — Progress Notes (Signed)
DAILY PROGRESS NOTE   Patient Name: Henry Chandler Date of Encounter: 05/13/2022 Cardiologist: None  Chief Complaint   No complaints  Patient Profile   Henry Chandler is a 41 y.o. male with a hx of uncontrolled HTN (dx around age 83), Covid PNA with associated respiratory failure requiring [place] O2 05/2019 (tx at OSH, c/b rhabdomyolysis and AKI), snoring, morbid obesity, tobacco abuse, habitual alcohol use (10-15 shots/week), family history of heart disease who is being seen 05/10/2022 for the evaluation of elevated troponin at the request of Dr. Estell Harpin.  Subjective   BP elevated yesterday evening, but improved today. About net even with I's/O's. Weight up 1KG? Creatinine is slowly rising - now 1.19. May be at end-diuresis. K 3.4 today.  Objective   Vitals:   05/12/22 1456 05/12/22 1800 05/12/22 1950 05/13/22 0530  BP: 140/90 (!) 148/110 (!) 151/86 118/83  Pulse:  (!) 109 (!) 106 (!) 104  Resp:  16 18 18   Temp:   98.4 F (36.9 C) 97.9 F (36.6 C)  TempSrc:   Oral Oral  SpO2:  95% 97% 98%  Weight:    (!) 153.3 kg  Height:        Intake/Output Summary (Last 24 hours) at 05/13/2022 05/15/2022 Last data filed at 05/13/2022 05/15/2022 Gross per 24 hour  Intake 1540 ml  Output 1451 ml  Net 89 ml   Filed Weights   05/10/22 1658 05/12/22 1300 05/13/22 0530  Weight: (!) 153.2 kg (!) 152.4 kg (!) 153.3 kg    Physical Exam   General appearance: alert, no distress, and morbidly obese Neck: JVD - 2 cm above sternal notch, no carotid bruit, and thyroid not enlarged, symmetric, no tenderness/mass/nodules Lungs: diminished breath sounds bibasilar Heart: regular rate and rhythm, S1, S2 normal, no murmur, click, rub or gallop Abdomen: soft, non-tender; bowel sounds normal; no masses,  no organomegaly and obese Extremities: edema trace to 1+ bilateral Pulses: 2+ and symmetric Skin: Skin color, texture, turgor normal. No rashes or lesions Neurologic: Grossly normal Psych: Flat affect  Inpatient  Medications    Scheduled Meds:  aspirin EC  81 mg Oral Daily   atorvastatin  40 mg Oral q1800   enoxaparin (LOVENOX) injection  80 mg Subcutaneous Q24H   furosemide  40 mg Intravenous BID   hydrALAZINE  25 mg Oral TID   irbesartan  300 mg Oral Daily   potassium chloride  40 mEq Oral Once   potassium chloride  40 mEq Oral Daily   sodium chloride flush  3 mL Intravenous Q12H   sodium chloride flush  3 mL Intravenous Q12H   spironolactone  25 mg Oral Daily    Continuous Infusions:  sodium chloride      PRN Meds: sodium chloride, acetaminophen, hydrALAZINE, nitroGLYCERIN, sodium chloride flush   Labs   Results for orders placed or performed during the hospital encounter of 05/10/22 (from the past 48 hour(s))  CBC     Status: Abnormal   Collection Time: 05/12/22  2:48 AM  Result Value Ref Range   WBC 10.1 4.0 - 10.5 K/uL   RBC 5.89 (H) 4.22 - 5.81 MIL/uL   Hemoglobin 16.9 13.0 - 17.0 g/dL   HCT 05/14/22 34.7 - 42.5 %   MCV 84.4 80.0 - 100.0 fL   MCH 28.7 26.0 - 34.0 pg   MCHC 34.0 30.0 - 36.0 g/dL   RDW 95.6 38.7 - 56.4 %   Platelets 248 150 - 400 K/uL   nRBC 0.0 0.0 -  0.2 %    Comment: Performed at [facility], [address], [place], [place] [address]  Basic metabolic panel     Status: Abnormal   Collection Time: 05/12/22  2:48 AM  Result Value Ref Range   Sodium 140 135 - 145 mmol/L   Potassium 3.4 (L) 3.5 - 5.1 mmol/L   Chloride 107 98 - 111 mmol/L   CO2 25 22 - 32 mmol/L   Glucose, Bld 115 (H) 70 - 99 mg/dL    Comment: Glucose reference range applies only to samples taken after fasting for at least 8 hours.   BUN 15 6 - 20 mg/dL   Creatinine, Ser 6.16 0.61 - 1.24 mg/dL   Calcium 9.2 8.9 - 07.3 mg/dL   GFR, Estimated >71 >06 mL/min    Comment: (NOTE) Calculated using the CKD-EPI Creatinine Equation (2021)    Anion gap 8 5 - 15    Comment: Performed at [facility], [address] [place], [place] [address]  Magnesium     Status: None   Collection  Time: 05/12/22  2:48 AM  Result Value Ref Range   Magnesium 2.0 1.7 - 2.4 mg/dL    Comment: Performed at [facility], [address], [place], [place] [address]  Phosphorus     Status: None   Collection Time: 05/12/22  2:48 AM  Result Value Ref Range   Phosphorus 4.3 2.5 - 4.6 mg/dL    Comment: Performed at [facility], [address] [place], [place] [address]    ECG   N/A  Telemetry   Sinus rhythm/tachycardia with PVC's- Personally Reviewed  Radiology    No results found.  Cardiac Studies   See echo above  Assessment   Principal Problem:   Chest pain of uncertain etiology Active Problems:   Essential hypertension   Hyperglycemia   Tobacco use   Class 3 severe obesity due to excess calories with serious comorbidity and body mass index (BMI) of 40.0 to 44.9 in adult [facility])   History of medication noncompliance   Polysubstance abuse (HCC)   Plan   Mr. Damore seems to be doing better - weight slightly up and I's/O's about even - creatinine trending up. Will stop lV lasix today - switch to po lasix. Plan for [facility] hopefully tomorrow. Orders are in. Keep NPO p MN. Potassium remains low -consider further increase in aldactone to 50 mg daily tomorrow.  Time Spent Directly with Patient:  I have spent a total of 25 minutes with the patient reviewing hospital notes, telemetry, EKGs, labs and examining the patient as well as establishing an assessment and plan that was discussed personally with the patient.  > 50% of time was spent in direct patient care.  Length of Stay:  LOS: 3 days   Chrystie Nose, MD, [facility], FACP  [place]  [facility]  Medical Director of the Advanced Lipid Disorders &  Cardiovascular Risk Reduction Clinic Diplomate of the American Board of Clinical Lipidology Attending Cardiologist  Direct Dial: [phone]  Fax: [phone]  Website:  [email or web address] 05/13/2022, 7:33 AM

## 2022-05-13 NOTE — Progress Notes (Signed)
   Cardiac catheterization was discussed with the patient fully. The patient understands that risks include but are not limited to stroke (1 in 1000), death (1 in 31), kidney failure [usually temporary] (1 in 500), bleeding (1 in 200), allergic reaction [possibly serious] (1 in 200).  The patient understands and is willing to proceed.    Pt on the board, orders written  Rosaria Ferries, PA-C 05/13/2022 5:59 PM

## 2022-05-14 ENCOUNTER — Inpatient Hospital Stay (HOSPITAL_COMMUNITY)
Admission: EM | Disposition: A | Discharge status: Home / Self Care | Payer: Self-pay | Source: Home / Self Care | Attending: Family Medicine

## 2022-05-14 DIAGNOSIS — I5020 Unspecified systolic (congestive) heart failure: Secondary | ICD-10-CM

## 2022-05-14 DIAGNOSIS — I509 Heart failure, unspecified: Secondary | ICD-10-CM | POA: Diagnosis not present

## 2022-05-14 DIAGNOSIS — R079 Chest pain, unspecified: Secondary | ICD-10-CM | POA: Diagnosis not present

## 2022-05-14 DIAGNOSIS — F1910 Other psychoactive substance abuse, uncomplicated: Secondary | ICD-10-CM | POA: Diagnosis not present

## 2022-05-14 DIAGNOSIS — I5043 Acute on chronic combined systolic (congestive) and diastolic (congestive) heart failure: Secondary | ICD-10-CM

## 2022-05-14 DIAGNOSIS — I5021 Acute systolic (congestive) heart failure: Secondary | ICD-10-CM | POA: Diagnosis not present

## 2022-05-14 HISTORY — PX: RIGHT/LEFT HEART CATH AND CORONARY ANGIOGRAPHY: CATH118266

## 2022-05-14 HISTORY — DX: Unspecified systolic (congestive) heart failure: I50.20

## 2022-05-14 LAB — POCT I-STAT EG7
Acid-Base Excess: 1 mmol/L (ref 0.0–2.0)
Bicarbonate: 26.1 mmol/L (ref 20.0–28.0)
Calcium, Ion: 1.26 mmol/L (ref 1.15–1.40)
HCT: 49 % (ref 39.0–52.0)
Hemoglobin: 16.7 g/dL (ref 13.0–17.0)
O2 Saturation: 76 %
Potassium: 3.8 mmol/L (ref 3.5–5.1)
Sodium: 141 mmol/L (ref 135–145)
TCO2: 27 mmol/L (ref 22–32)
pCO2, Ven: 42.4 mmHg — ABNORMAL LOW (ref 44–60)
pH, Ven: 7.398 (ref 7.25–7.43)
pO2, Ven: 41 mmHg (ref 32–45)

## 2022-05-14 LAB — MAGNESIUM: Magnesium: 1.9 mg/dL (ref 1.7–2.4)

## 2022-05-14 LAB — BASIC METABOLIC PANEL
Anion gap: 9 (ref 5–15)
BUN: 13 mg/dL (ref 6–20)
CO2: 23 mmol/L (ref 22–32)
Calcium: 9.2 mg/dL (ref 8.9–10.3)
Chloride: 106 mmol/L (ref 98–111)
Creatinine, Ser: 1.07 mg/dL (ref 0.61–1.24)
GFR, Estimated: >60 mL/min (ref >60)
Glucose, Bld: 105 mg/dL — ABNORMAL HIGH (ref 70–99)
Potassium: 3.7 mmol/L (ref 3.5–5.1)
Sodium: 138 mmol/L (ref 135–145)

## 2022-05-14 LAB — CBC
HCT: 48.7 % (ref 39.0–52.0)
Hemoglobin: 16.5 g/dL (ref 13.0–17.0)
MCH: 28.4 pg (ref 26.0–34.0)
MCHC: 33.9 g/dL (ref 30.0–36.0)
MCV: 84 fL (ref 80.0–100.0)
Platelets: 252 10*3/uL (ref 150–400)
RBC: 5.8 MIL/uL (ref 4.22–5.81)
RDW: 14.6 % (ref 11.5–15.5)
WBC: 9 10*3/uL (ref 4.0–10.5)
nRBC: 0 % (ref 0.0–0.2)

## 2022-05-14 SURGERY — RIGHT/LEFT HEART CATH AND CORONARY ANGIOGRAPHY
Anesthesia: LOCAL

## 2022-05-14 MED ORDER — HEPARIN (PORCINE) IN NACL 1000-0.9 UT/500ML-% IV SOLN
INTRAVENOUS | Status: DC | PRN
  Administered 2022-05-14 (×2): 500 mL

## 2022-05-14 MED ORDER — SPIRONOLACTONE 25 MG PO TABS
50.0000 mg | ORAL_TABLET | Freq: Every day | ORAL | Status: DC
  Administered 2022-05-14 – 2022-05-15 (×2): 50 mg via ORAL
  Filled 2022-05-14 (×2): qty 2

## 2022-05-14 MED ORDER — ACETAMINOPHEN 325 MG PO TABS: 650.0000 mg | ORAL_TABLET | ORAL | Status: DC | PRN

## 2022-05-14 MED ORDER — LABETALOL HCL 5 MG/ML IV SOLN
10.0000 mg | INTRAVENOUS | Status: AC | PRN
Start: 2022-05-14 — End: 2022-05-14

## 2022-05-14 MED ORDER — HYDRALAZINE HCL 20 MG/ML IJ SOLN
10.0000 mg | INTRAMUSCULAR | Status: AC | PRN
Start: 2022-05-14 — End: 2022-05-14

## 2022-05-14 MED ORDER — VERAPAMIL HCL 2.5 MG/ML IV SOLN
INTRAVENOUS | Status: DC | PRN
  Administered 2022-05-14: 10 mL via INTRA_ARTERIAL

## 2022-05-14 MED ORDER — ENOXAPARIN SODIUM 40 MG/0.4ML IJ SOSY
40.0000 mg | PREFILLED_SYRINGE | INTRAMUSCULAR | Status: DC
  Administered 2022-05-15: 40 mg via SUBCUTANEOUS
  Filled 2022-05-14: qty 0.4

## 2022-05-14 MED ORDER — VERAPAMIL HCL 2.5 MG/ML IV SOLN
INTRAVENOUS | Status: AC
  Filled 2022-05-14: qty 2

## 2022-05-14 MED ORDER — HEPARIN SODIUM (PORCINE) 1000 UNIT/ML IJ SOLN
INTRAMUSCULAR | Status: DC | PRN
  Administered 2022-05-14: 7000 [IU] via INTRAVENOUS

## 2022-05-14 MED ORDER — HEPARIN SODIUM (PORCINE) 1000 UNIT/ML IJ SOLN
INTRAMUSCULAR | Status: AC
  Filled 2022-05-14: qty 10

## 2022-05-14 MED ORDER — ASPIRIN 81 MG PO CHEW
81.0000 mg | CHEWABLE_TABLET | Freq: Every day | ORAL | Status: DC
  Administered 2022-05-15: 81 mg via ORAL
  Filled 2022-05-14: qty 1

## 2022-05-14 MED ORDER — FENTANYL CITRATE (PF) 100 MCG/2ML IJ SOLN
INTRAMUSCULAR | Status: DC | PRN
  Administered 2022-05-14: 50 ug via INTRAVENOUS

## 2022-05-14 MED ORDER — FENTANYL CITRATE (PF) 100 MCG/2ML IJ SOLN
INTRAMUSCULAR | Status: AC
  Filled 2022-05-14: qty 2

## 2022-05-14 MED ORDER — MIDAZOLAM HCL 2 MG/2ML IJ SOLN
INTRAMUSCULAR | Status: DC | PRN
  Administered 2022-05-14: 1 mg via INTRAVENOUS

## 2022-05-14 MED ORDER — SODIUM CHLORIDE 0.9% FLUSH: 3.0000 mL | INTRAVENOUS | Status: DC | PRN

## 2022-05-14 MED ORDER — IOHEXOL 350 MG/ML SOLN
INTRAVENOUS | Status: DC | PRN
  Administered 2022-05-14: 45 mL

## 2022-05-14 MED ORDER — OXYCODONE HCL 5 MG PO TABS
5.0000 mg | ORAL_TABLET | ORAL | Status: DC | PRN
  Administered 2022-05-14: 10 mg via ORAL
  Administered 2022-05-15: 5 mg via ORAL
  Filled 2022-05-14: qty 1
  Filled 2022-05-14: qty 2

## 2022-05-14 MED ORDER — SODIUM CHLORIDE 0.9% FLUSH: 3.0000 mL | Freq: Two times a day (BID) | INTRAVENOUS | Status: DC

## 2022-05-14 MED ORDER — MIDAZOLAM HCL 2 MG/2ML IJ SOLN
INTRAMUSCULAR | Status: AC
  Filled 2022-05-14: qty 2

## 2022-05-14 MED ORDER — SODIUM CHLORIDE 0.9 % IV SOLN: INTRAVENOUS | Status: AC

## 2022-05-14 MED ORDER — LIDOCAINE HCL (PF) 1 % IJ SOLN
INTRAMUSCULAR | Status: AC
  Filled 2022-05-14: qty 30

## 2022-05-14 MED ORDER — SODIUM CHLORIDE 0.9 % IV SOLN: 250.0000 mL | INTRAVENOUS | Status: DC | PRN

## 2022-05-14 MED ORDER — HEPARIN (PORCINE) IN NACL 1000-0.9 UT/500ML-% IV SOLN
INTRAVENOUS | Status: AC
  Filled 2022-05-14: qty 1000

## 2022-05-14 MED ORDER — ONDANSETRON HCL 4 MG/2ML IJ SOLN: 4.0000 mg | Freq: Four times a day (QID) | INTRAMUSCULAR | Status: DC | PRN

## 2022-05-14 SURGICAL SUPPLY — 15 items
BAND CMPR LRG ZPHR (HEMOSTASIS) ×1
BAND ZEPHYR COMPRESS 30 LONG (HEMOSTASIS) ×1 IMPLANT
CATH 5FR JL3.5 JR4 ANG PIG MP (CATHETERS) ×1 IMPLANT
CATH BALLN WEDGE 5F 110CM (CATHETERS) ×1 IMPLANT
CATH LAUNCHER 5F EBU3.5 (CATHETERS) ×1 IMPLANT
GLIDESHEATH SLEND A-KIT 6F 22G (SHEATH) ×1 IMPLANT
GUIDEWIRE .025 260CM (WIRE) ×1 IMPLANT
GUIDEWIRE INQWIRE 1.5J.035X260 (WIRE) IMPLANT
INQWIRE 1.5J .035X260CM (WIRE) ×2
KIT HEART LEFT (KITS) ×3 IMPLANT
PACK CARDIAC CATHETERIZATION (CUSTOM PROCEDURE TRAY) ×3 IMPLANT
SHEATH GLIDE SLENDER 4/5FR (SHEATH) ×1 IMPLANT
SHEATH PROBE COVER 6X72 (BAG) ×1 IMPLANT
TRANSDUCER W/STOPCOCK (MISCELLANEOUS) ×3 IMPLANT
TUBING CIL FLEX 10 FLL-RA (TUBING) ×3 IMPLANT

## 2022-05-14 NOTE — Progress Notes (Signed)
Heart Failure Nurse Navigator Progress Note  Echo-- EF 20-25%  R/L HC sch 5/30  Navigation team following this hospitalization. Screening pending further testing/cardiac workup.   Kevan Rosebush, RN, BSN, [facility] Heart Failure Navigator Heart & Vascular Care Navigation Team

## 2022-05-14 NOTE — Care Management (Signed)
  Transition of Care [facility]) Screening Note   Patient Details  Name: Henry Chandler Date of Birth: Feb 07, 1981   Transition of Care [facility]) CM/SW Contact:    Gala Lewandowsky, RN Phone Number: 05/14/2022, 2:40 PM    Transition of Care Department [facility]) has reviewed the patient and no TOC needs have been identified at this time. Patient has [employer] and uses [employer]. Case Manager will continue to follow for additional transition of care needs as the patient progresses.

## 2022-05-14 NOTE — H&P (View-Only) (Signed)
DAILY PROGRESS NOTE   Patient Name: Henry Chandler Date of Encounter: 05/14/2022 Cardiologist: None  Chief Complaint   No complaints  Patient Profile   Henry Chandler is a 41 y.o. male with a hx of uncontrolled HTN (dx around age 46), Covid PNA with associated respiratory failure requiring [place] O2 05/2019 (tx at OSH, c/b rhabdomyolysis and AKI), snoring, morbid obesity, tobacco abuse, habitual alcohol use (10-15 shots/week), family history of heart disease who is being seen 05/10/2022 for the evaluation of elevated troponin at the request of Dr. Roderic Palau.  Subjective   Plan for [facility] today for chest pain, acute systolic congestive heart failure and elevated troponin with coronary risk factors. Recorded net positive yesterday- I had switched him to oral diuretics. Creatinine better today at 1.07. Potassium is 3.7, magnesium 1.9.  Objective   Vitals:   05/13/22 1959 05/14/22 0550 05/14/22 0641 05/14/22 0736  BP: (!) 145/80 (!) 146/103  (!) 152/109  Pulse:  95  91  Resp: 14 18  16   Temp: 98.2 F (36.8 C) 98.6 F (37 C)  98.1 F (36.7 C)  TempSrc: Oral Oral  Oral  SpO2: 100% 97%  96%  Weight:   (!) 153.7 kg   Height:        Intake/Output Summary (Last 24 hours) at 05/14/2022 0811 Last data filed at 05/14/2022 0608 Gross per 24 hour  Intake 1080 ml  Output 600 ml  Net 480 ml   Filed Weights   05/12/22 1300 05/13/22 0530 05/14/22 0641  Weight: (!) 152.4 kg (!) 153.3 kg (!) 153.7 kg    Physical Exam   General appearance: alert, no distress, and morbidly obese Neck: JVD - 1 cm above sternal notch, no carotid bruit, and thyroid not enlarged, symmetric, no tenderness/mass/nodules Lungs: diminished breath sounds bibasilar Heart: regular rate and rhythm, S1, S2 normal, no murmur, click, rub or gallop Abdomen: soft, non-tender; bowel sounds normal; no masses,  no organomegaly Extremities: edema trace to 1+ bilateral Pulses: 2+ and symmetric Skin: Skin color, texture, turgor normal. No  rashes or lesions Neurologic: Grossly normal Psych: Flat affect  Inpatient Medications    Scheduled Meds:  aspirin EC  81 mg Oral Daily   atorvastatin  40 mg Oral q1800   enoxaparin (LOVENOX) injection  80 mg Subcutaneous Q24H   furosemide  40 mg Oral Daily   hydrALAZINE  25 mg Oral TID   irbesartan  300 mg Oral Daily   potassium chloride  40 mEq Oral Daily   sodium chloride flush  3 mL Intravenous Q12H   sodium chloride flush  3 mL Intravenous Q12H   spironolactone  25 mg Oral Daily    Continuous Infusions:  sodium chloride     sodium chloride     sodium chloride      PRN Meds: sodium chloride, sodium chloride, acetaminophen, hydrALAZINE, nitroGLYCERIN, sodium chloride flush, sodium chloride flush   Labs   Results for orders placed or performed during the hospital encounter of 05/10/22 (from the past 48 hour(s))  CBC     Status: None   Collection Time: 05/14/22  6:58 AM  Result Value Ref Range   WBC 9.0 4.0 - 10.5 K/uL   RBC 5.80 4.22 - 5.81 MIL/uL   Hemoglobin 16.5 13.0 - 17.0 g/dL   HCT 48.7 39.0 - 52.0 %   MCV 84.0 80.0 - 100.0 fL   MCH 28.4 26.0 - 34.0 pg   MCHC 33.9 30.0 - 36.0 g/dL   RDW 14.6 11.5 -  15.5 %   Platelets 252 150 - 400 K/uL   nRBC 0.0 0.0 - 0.2 %    Comment: Performed at [facility], [place] [address] [place], [place] [address]    ECG   N/A  Telemetry   Sinus rhythm/tachycardia with PVC's- Personally Reviewed  Radiology    No results found.  Cardiac Studies   See echo above  Assessment   Principal Problem:   Chest pain of uncertain etiology Active Problems:   Essential hypertension   Hyperglycemia   Tobacco use   Class 3 severe obesity due to excess calories with serious comorbidity and body mass index (BMI) of 40.0 to 44.9 in adult [facility])   History of medication noncompliance   Polysubstance abuse [facility])   Plan   Mr. Marolt is on for [facility] today. Transitioned to oral lasix. Slightly net positive - BP remains  elevated. Will increase aldactone to 50 mg daily. He is on daily potassium 40 MEQ repletion. Potassium 3.7 today. Will follow-up on cath results later today.  Time Spent Directly with Patient:  I have spent a total of 25 minutes with the patient reviewing hospital notes, telemetry, EKGs, labs and examining the patient as well as establishing an assessment and plan that was discussed personally with the patient.  > 50% of time was spent in direct patient care.  Length of Stay:  LOS: 4 days   Pixie Casino, MD, [facility], [place] Director of the Advanced Lipid Disorders &  Cardiovascular Risk Reduction Clinic Diplomate of the American Board of Clinical Lipidology Attending Cardiologist  Direct Dial: [phone]  Fax: [phone]  Website:  [email or web address]  Nadean Corwin Antoneo Ghrist 05/14/2022, 8:11 AM

## 2022-05-14 NOTE — Progress Notes (Signed)
PROGRESS NOTE    Henry Chandler  KCL:[id number] DOB: February 18, 1981 DOA: 05/10/2022 PCP: Pcp, No   Brief Narrative:  This 41 years old male with PMH significant for back pain, EtOH dependence, hypertension, morbid obesity and tobacco dependence presented in the ED with c/o: chest pain and shortness of breath. Patient reports having cough for about a month ago and for last 1 week he has developed shortness of breath.  He also noticed some chest tightness that radiates towards the back.  Patient has been using his regular inhalers without any relief.  Pain is associated with exertion.  Patient has been noncompliant with his medications. ED work-up shows elevated troponins.  Cardiology is consulted , scheduled for left heart cath today.  Assessment & Plan:   Principal Problem:   Acute on chronic combined systolic and diastolic CHF (congestive heart failure) (HCC) Active Problems:   Essential hypertension   Hyperglycemia   Tobacco use   Class 3 severe obesity due to excess calories with serious comorbidity and body mass index (BMI) of 40.0 to 44.9 in adult [facility])   Chest pain of uncertain etiology   History of medication noncompliance   Polysubstance abuse (HCC)   Chest pain: Suspected ACS. Patient presented with substernal chest pain, shortness of breath for 1 week.   Pain worsens with exertion,  gets better with rest.  Appears like angina. This is in the setting of medication noncompliance, not on any antihypertensive medications. EKG abnormal ST-T wave changes, troponin 63 > 70, BNP 536. Continue aspirin, statins.  He reports chest pain has improved. Echocardiogram shows EF 20 to 25% consistent with cardiomyopathy. Cardiology recommended ischemic work-up. Patient is scheduled to have left and right heart cath today.  Essential hypertension: Continue Avapro 300 mg daily, hydralazine as needed. Hydralazine 25 mg PO 3 times daily added for better blood pressure control. Continue to monitor  blood pressure.  Hyperlipidemia: Continue Lipitor 40 mg daily.  Polysubstance abuse: Patient acknowledges being drinking on weekends and periodic THC use Cessation encouraged.  Urine drug screen negative.  Tobacco dependence: Counseling completed.  Patient not interested. Refuses nicotine patch  Morbid Obesity: Diet and exercise discussed in detail.  Weight loss encouraged.  Estimated body mass index is 44.71 kg/m as calculated from the following:   Height as of this encounter: 6\' 1"  (1.854 m).   Weight as of this encounter: 153.7 kg.    DVT prophylaxis: Lovenox Code Status: Full code Family Communication: Wife at bedside Disposition Plan:   Status is: Inpatient Remains inpatient appropriate because:   Admitted for chest pain, simulating angina.  Cardiology is consulted, managing his ACS.   Patient is scheduled to have right and left heart cath today.  Anticipated discharge home tomorrow or today later in the evening.   Consultants:  Cardiology  Procedures: Echocardiogram Antimicrobials: None  Subjective: Patient was seen and examined at bedside.  Overnight events noted.   Patient reports chest pain has improved.  He denies any shortness of breath. Blood pressure is also improved.  He is scheduled for left heart cath today  Objective: Vitals:   05/14/22 0550 05/14/22 0641 05/14/22 0736 05/14/22 1215  BP: (!) 146/103  (!) 152/109 (!) 128/93  Pulse: 95  91 92  Resp: 18  16 17   Temp: 98.6 F (37 C)  98.1 F (36.7 C) 98.6 F (37 C)  TempSrc: Oral  Oral Oral  SpO2: 97%  96% 97%  Weight:  (!) 153.7 kg    Height:  Intake/Output Summary (Last 24 hours) at 05/14/2022 1449 Last data filed at 05/14/2022 0370 Gross per 24 hour  Intake 480 ml  Output 600 ml  Net -120 ml   Filed Weights   05/12/22 1300 05/13/22 0530 05/14/22 0641  Weight: (!) 152.4 kg (!) 153.3 kg (!) 153.7 kg    Examination:  General exam: Appears comfortable, not in any acute  distress. Respiratory system: CTA bilaterally, normal respiratory effort, RR 15 Cardiovascular system: S1-S2 heard, regular rate and rhythm, no murmur. Gastrointestinal system: Abdomen is soft, non tender, non distended, BS+ Central nervous system: Alert and oriented x 3 . No focal neurological deficits. Extremities: No edema, no cyanosis, no clubbing. Skin: No rashes, lesions or ulcers Psychiatry: Judgement and insight appear normal. Mood & affect appropriate.     Data Reviewed: I have personally reviewed following labs and imaging studies  CBC: Recent Labs  Lab 05/10/22 1022 05/11/22 0309 05/12/22 0248 05/14/22 0658  WBC 6.8 7.6 10.1 9.0  HGB 16.5 16.1 16.9 16.5  HCT 48.7 48.0 49.7 48.7  MCV 84.5 84.4 84.4 84.0  PLT 270 245 248 252   Basic Metabolic Panel: Recent Labs  Lab 05/10/22 1022 05/10/22 1630 05/11/22 0309 05/12/22 0248 05/14/22 0658  NA 139  --  137 140 138  K 3.0*  --  3.1* 3.4* 3.7  CL 105  --  103 107 106  CO2 26  --  26 25 23   GLUCOSE 146*  --  110* 115* 105*  BUN 9  --  10 15 13   CREATININE 1.08  --  1.10 1.19 1.07  CALCIUM 8.9  --  8.8* 9.2 9.2  MG  --  1.9 2.0 2.0 1.9  PHOS  --   --   --  4.3  --    GFR: Estimated Creatinine Clearance: 143.4 mL/min (by C-G formula based on SCr of 1.07 mg/dL). Liver Function Tests: Recent Labs  Lab 05/10/22 1219  AST 21  ALT 25  ALKPHOS 70  BILITOT 0.4  PROT 7.0  ALBUMIN 3.6   No results for input(s): LIPASE, AMYLASE in the last 168 hours. No results for input(s): AMMONIA in the last 168 hours. Coagulation Profile: No results for input(s): INR, PROTIME in the last 168 hours. Cardiac Enzymes: No results for input(s): CKTOTAL, CKMB, CKMBINDEX, TROPONINI in the last 168 hours. BNP (last 3 results) No results for input(s): PROBNP in the last 8760 hours. HbA1C: No results for input(s): HGBA1C in the last 72 hours.  CBG: No results for input(s): GLUCAP in the last 168 hours. Lipid Profile: No results  for input(s): CHOL, HDL, LDLCALC, TRIG, CHOLHDL, LDLDIRECT in the last 72 hours.  Thyroid Function Tests: No results for input(s): TSH, T4TOTAL, FREET4, T3FREE, THYROIDAB in the last 72 hours.  Anemia Panel: No results for input(s): VITAMINB12, FOLATE, FERRITIN, TIBC, IRON, RETICCTPCT in the last 72 hours. Sepsis Labs: No results for input(s): PROCALCITON, LATICACIDVEN in the last 168 hours.  Recent Results (from the past 240 hour(s))  SARS Coronavirus 2 by RT PCR (hospital order, performed in [facility] hospital lab) *cepheid single result test* Anterior Nasal Swab     Status: None   Collection Time: 05/10/22  1:47 PM   Specimen: Anterior Nasal Swab  Result Value Ref Range Status   SARS Coronavirus 2 by RT PCR NEGATIVE NEGATIVE Final    Comment: (NOTE) SARS-CoV-2 target nucleic acids are NOT DETECTED.  The SARS-CoV-2 RNA is generally detectable in upper and lower respiratory specimens during the  acute phase of infection. The lowest concentration of SARS-CoV-2 viral copies this assay can detect is 250 copies / mL. A negative result does not preclude SARS-CoV-2 infection and should not be used as the sole basis for treatment or other patient management decisions.  A negative result may occur with improper specimen collection / handling, submission of specimen other than nasopharyngeal swab, presence of viral mutation(s) within the areas targeted by this assay, and inadequate number of viral copies (<250 copies / mL). A negative result must be combined with clinical observations, patient history, and epidemiological information.  Fact Sheet for Patients:   RoadLapTop.co.za  Fact Sheet for Healthcare Providers: http://kim-miller.com/  This test is not yet approved or  cleared by the [place] FDA and has been authorized for detection and/or diagnosis of SARS-CoV-2 by FDA under an Emergency Use Authorization (EUA).  This EUA will  remain in effect (meaning this test can be used) for the duration of the COVID-19 declaration under Section 564(b)(1) of the Act, 21 U.S.C. section 360bbb-3(b)(1), unless the authorization is terminated or revoked sooner.  Performed at [facility], [address], [place], [place] [address]     Radiology Studies: No results found.  Scheduled Meds:  aspirin EC  81 mg Oral Daily   atorvastatin  40 mg Oral q1800   enoxaparin (LOVENOX) injection  80 mg Subcutaneous Q24H   furosemide  40 mg Oral Daily   hydrALAZINE  25 mg Oral TID   irbesartan  300 mg Oral Daily   potassium chloride  40 mEq Oral Daily   sodium chloride flush  3 mL Intravenous Q12H   sodium chloride flush  3 mL Intravenous Q12H   spironolactone  50 mg Oral Daily   Continuous Infusions:  sodium chloride     sodium chloride     sodium chloride       LOS: 4 days    Time spent: 35 mins    Uziel Covault, MD [facility]   If 7PM-7AM, please contact night-coverage

## 2022-05-14 NOTE — Progress Notes (Signed)
Heart Failure Stewardship Pharmacist Progress Note   PCP: Pcp, No PCP-Cardiologist: None    HPI:  41 yo M with PMH of uncontrolled HTN, morbid obesity, tobacco use, and habitual alcohol use. He presented to the ED on 5/26 following a PCP appt for chest tightness and hypertension. CXR with no edema but possible PNA. ECHO on 5/27 with LVEF of 20-25%, moderate LVH with G2DD and RV low normal. R/LHC on 5/30 with normal coronary arteries and elevated LVEDP. He had an ECHO in 12/2019 with LVEF 55-60% and then was lost to follow up without completing a sleep study. Notably noncompliant with medications.  Current HF Medications: Diuretic: furosemide 40 mg PO daily ACE/ARB/ARNI: irbesartan 300 mg daily Aldosterone Antagonist: spironolactone 50 mg daily Other: hydralazine 25 mg TID  Prior to admission HF Medications: None  Pertinent Lab Values: Serum creatinine 1.07, BUN 13, Potassium 3.8, Sodium 141, BNP 536, Magnesium 1.9, A1c 5.6  Vital Signs: Weight: 338 lbs (admission weight: 337 lbs) Blood pressure: 140/90s  Heart rate: 90-100s  I/O: not accurate  Medication Assistance / Insurance Benefits Check: Does the patient have prescription insurance?  Yes Type of insurance plan: Control and instrumentation engineer  Outpatient Pharmacy:  Prior to admission outpatient pharmacy: [facility] Is the patient willing to use [facility] TOC pharmacy at discharge? Yes Is the patient willing to transition their outpatient pharmacy to utilize a [facility] outpatient pharmacy?   Pending    Assessment: 1. Acute systolic CHF (LVEF 20-25%), due to NICM - uncontrolled HTN. NYHA class II symptoms. - Continue furosemide 40 mg PO daily - Consider adding carvedilol once further out from HF exacerbation given significant LV dysfunction and low/normal RV - Continue irbesartan 300 mg daily - consider transitioning to Entresto 49/51 mg BID - Continue spironolactone 50 mg daily - Continue hydralazine 25 mg TID - consider adding nitrate for  HFrEF in AA     Plan: 1) Medication changes recommended at this time: - Stop losartan - Start Entresto 49/51 mg BID - Start Imdur 30 mg daily  2) Patient assistance: - Pending  3)  Education  - To be completed prior to discharge  Sharen Hones, PharmD, BCPS Heart Failure Engineer, building services Phone [phone]

## 2022-05-14 NOTE — CV Procedure (Signed)
Right dominant coronary anatomy. Normal coronary arteries. Elevated LVEDP 28 mmHg. Normal pulmonary pressures.  Mean pulmonary artery pressure 19 mmHg.   Mean capillary wedge pressure 13 mmHg. Cardiac output 7.6 L/min with index 2.82 L/min/m.   Pulmonary artery O2 saturation 77%.   Compensated systolic heart failure.  Recommend guideline directed therapy for systolic heart failure and discontinuation of substances with potential deleterious harm (alcohol/others).  He is aggressive blood pressure control.  Discussed the importance of compliance with medical therapy and CPAP.

## 2022-05-14 NOTE — Progress Notes (Signed)
 DAILY PROGRESS NOTE   Patient Name: Henry Chandler Date of Encounter: 05/14/2022 Cardiologist: None  Chief Complaint   No complaints  Patient Profile   Henry Chandler is a 41 y.o. male with a hx of uncontrolled HTN (dx around age 26), Covid PNA with associated respiratory failure requiring [facility] Chandler 05/2019 (tx at OSH, c/b rhabdomyolysis and AKI), snoring, morbid obesity, tobacco abuse, habitual alcohol use (10-15 shots/week), family history of heart disease who is being seen 05/10/2022 for the evaluation of elevated troponin at the request of Dr. Zammit.  Subjective   Plan for L/RHC today for chest pain, acute systolic congestive heart failure and elevated troponin with coronary risk factors. Recorded net positive yesterday- I had switched him to oral diuretics. Creatinine better today at 1.07. Potassium is 3.7, magnesium 1.9.  Objective   Vitals:   05/13/22 1959 05/14/22 0550 05/14/22 0641 05/14/22 0736  BP: (!) 145/80 (!) 146/103  (!) 152/109  Pulse:  95  91  Resp: 14 18  16  Temp: 98.2 F (36.8 C) 98.6 F (37 C)  98.1 F (36.7 C)  TempSrc: Oral Oral  Oral  SpO2: 100% 97%  96%  Weight:   (!) 153.7 kg   Height:        Intake/Output Summary (Last 24 hours) at 05/14/2022 0811 Last data filed at 05/14/2022 0608 Gross per 24 hour  Intake 1080 ml  Output 600 ml  Net 480 ml   Filed Weights   05/12/22 1300 05/13/22 0530 05/14/22 0641  Weight: (!) 152.4 kg (!) 153.3 kg (!) 153.7 kg    Physical Exam   General appearance: alert, no distress, and morbidly obese Neck: JVD - 1 cm above sternal notch, no carotid bruit, and thyroid not enlarged, symmetric, no tenderness/mass/nodules Lungs: diminished breath sounds bibasilar Heart: regular rate and rhythm, S1, S2 normal, no murmur, click, rub or gallop Abdomen: soft, non-tender; bowel sounds normal; no masses,  no organomegaly Extremities: edema trace to 1+ bilateral Pulses: 2+ and symmetric Skin: Skin color, texture, turgor normal. No  rashes or lesions Neurologic: Grossly normal Psych: Flat affect  Inpatient Medications    Scheduled Meds:  aspirin EC  81 mg Oral Daily   atorvastatin  40 mg Oral q1800   enoxaparin (LOVENOX) injection  80 mg Subcutaneous Q24H   furosemide  40 mg Oral Daily   hydrALAZINE  25 mg Oral TID   irbesartan  300 mg Oral Daily   potassium chloride  40 mEq Oral Daily   sodium chloride flush  3 mL Intravenous Q12H   sodium chloride flush  3 mL Intravenous Q12H   spironolactone  25 mg Oral Daily    Continuous Infusions:  sodium chloride     sodium chloride     sodium chloride      PRN Meds: sodium chloride, sodium chloride, acetaminophen, hydrALAZINE, nitroGLYCERIN, sodium chloride flush, sodium chloride flush   Labs   Results for orders placed or performed during the hospital encounter of 05/10/22 (from the past 48 hour(s))  CBC     Status: None   Collection Time: 05/14/22  6:58 AM  Result Value Ref Range   WBC 9.0 4.0 - 10.5 K/uL   RBC 5.80 4.22 - 5.81 MIL/uL   Hemoglobin 16.5 13.0 - 17.0 g/dL   HCT 48.7 39.0 - 52.0 %   MCV 84.0 80.0 - 100.0 fL   MCH 28.4 26.0 - 34.0 pg   MCHC 33.9 30.0 - 36.0 g/dL   RDW 14.6 11.5 -   15.5 %   Platelets 252 150 - 400 K/uL   nRBC 0.0 0.0 - 0.2 %    Comment: Performed at [facility], [address] [place], [place] [address]    ECG   N/A  Telemetry   Sinus rhythm/tachycardia with PVC's- Personally Reviewed  Radiology    No results found.  Cardiac Studies   See echo above  Assessment   Principal Problem:   Chest pain of uncertain etiology Active Problems:   Essential hypertension   Hyperglycemia   Tobacco use   Class 3 severe obesity due to excess calories with serious comorbidity and body mass index (BMI) of 40.0 to 44.9 in adult (HCC)   History of medication noncompliance   Polysubstance abuse (HCC)   Plan   Henry Chandler is on for R/LHC today. Transitioned to oral lasix. Slightly net positive - BP remains  elevated. Will increase aldactone to 50 mg daily. He is on daily potassium 40 MEQ repletion. Potassium 3.7 today. Will follow-up on cath results later today.  Time Spent Directly with Patient:  I have spent a total of 25 minutes with the patient reviewing hospital notes, telemetry, EKGs, labs and examining the patient as well as establishing an assessment and plan that was discussed personally with the patient.  > 50% of time was spent in direct patient care.  Length of Stay:  LOS: 4 days   Randale Carvalho C. Vikrant Pryce, MD, FACC, FACP  [place]  [facility]  Medical Director of the Advanced Lipid Disorders &  Cardiovascular Risk Reduction Clinic Diplomate of the American Board of Clinical Lipidology Attending Cardiologist  Direct Dial: [phone]  Fax: [phone]  Website:  [email or web address]  Leontina Skidmore C Kemp Gomes 05/14/2022, 8:11 AM   

## 2022-05-14 NOTE — Interval H&P Note (Signed)
Cath Lab Visit (complete for each Cath Lab visit)  Clinical Evaluation Leading to the Procedure:   ACS: Yes.    Non-ACS:    Anginal Classification: CCS II  Anti-ischemic medical therapy: Minimal Therapy (1 class of medications)  Non-Invasive Test Results: No non-invasive testing performed  Prior CABG: No previous CABG      History and Physical Interval Note:  05/14/2022 3:35 PM  Mylez Rothermel  has presented today for surgery, with the diagnosis of HF.  The various methods of treatment have been discussed with the patient and family. After consideration of risks, benefits and other options for treatment, the patient has consented to  Procedure(s): RIGHT/LEFT HEART CATH AND CORONARY ANGIOGRAPHY (N/A) as a surgical intervention.  The patient's history has been reviewed, patient examined, no change in status, stable for surgery.  I have reviewed the patient's chart and labs.  Questions were answered to the patient's satisfaction.     Lyn Records III

## 2022-05-15 ENCOUNTER — Other Ambulatory Visit (HOSPITAL_COMMUNITY): Payer: Self-pay

## 2022-05-15 ENCOUNTER — Encounter (HOSPITAL_COMMUNITY): Payer: Self-pay | Admitting: Interventional Cardiology

## 2022-05-15 DIAGNOSIS — F1910 Other psychoactive substance abuse, uncomplicated: Secondary | ICD-10-CM | POA: Diagnosis not present

## 2022-05-15 DIAGNOSIS — I5043 Acute on chronic combined systolic (congestive) and diastolic (congestive) heart failure: Secondary | ICD-10-CM

## 2022-05-15 DIAGNOSIS — Z6841 Body Mass Index (BMI) 40.0 and over, adult: Secondary | ICD-10-CM | POA: Diagnosis not present

## 2022-05-15 LAB — POCT I-STAT 7, (LYTES, BLD GAS, ICA,H+H)
Acid-base deficit: 2 mmol/L (ref 0.0–2.0)
Bicarbonate: 22.8 mmol/L (ref 20.0–28.0)
Calcium, Ion: 1.21 mmol/L (ref 1.15–1.40)
HCT: 47 % (ref 39.0–52.0)
Hemoglobin: 16 g/dL (ref 13.0–17.0)
O2 Saturation: 98 %
Potassium: 3.7 mmol/L (ref 3.5–5.1)
Sodium: 134 mmol/L — ABNORMAL LOW (ref 135–145)
TCO2: 24 mmol/L (ref 22–32)
pCO2 arterial: 38.2 mmHg (ref 32–48)
pH, Arterial: 7.384 (ref 7.35–7.45)
pO2, Arterial: 114 mmHg — ABNORMAL HIGH (ref 83–108)

## 2022-05-15 LAB — POCT I-STAT EG7
Acid-Base Excess: 1 mmol/L (ref 0.0–2.0)
Bicarbonate: 26.4 mmol/L (ref 20.0–28.0)
Calcium, Ion: 1.24 mmol/L (ref 1.15–1.40)
HCT: 49 % (ref 39.0–52.0)
Hemoglobin: 16.7 g/dL (ref 13.0–17.0)
O2 Saturation: 77 %
Potassium: 3.8 mmol/L (ref 3.5–5.1)
Sodium: 142 mmol/L (ref 135–145)
TCO2: 28 mmol/L (ref 22–32)
pCO2, Ven: 42.4 mmHg — ABNORMAL LOW (ref 44–60)
pH, Ven: 7.403 (ref 7.25–7.43)
pO2, Ven: 41 mmHg (ref 32–45)

## 2022-05-15 MED ORDER — HYDRALAZINE HCL 25 MG PO TABS
25.0000 mg | ORAL_TABLET | Freq: Three times a day (TID) | ORAL | 1 refills | Status: DC
  Filled 2022-05-15: qty 90, 30d supply, fill #0

## 2022-05-15 MED ORDER — ATORVASTATIN CALCIUM 40 MG PO TABS
40.0000 mg | ORAL_TABLET | Freq: Every day | ORAL | 1 refills | Status: DC
  Filled 2022-05-15: qty 30, 30d supply, fill #0

## 2022-05-15 MED ORDER — ISOSORBIDE MONONITRATE ER 30 MG PO TB24
30.0000 mg | ORAL_TABLET | Freq: Every day | ORAL | Status: DC
  Administered 2022-05-15: 30 mg via ORAL
  Filled 2022-05-15: qty 1

## 2022-05-15 MED ORDER — ISOSORBIDE MONONITRATE ER 30 MG PO TB24
30.0000 mg | ORAL_TABLET | Freq: Every day | ORAL | 1 refills | Status: DC
  Filled 2022-05-15: qty 30, 30d supply, fill #0

## 2022-05-15 MED ORDER — EMPAGLIFLOZIN 10 MG PO TABS
10.0000 mg | ORAL_TABLET | Freq: Every day | ORAL | Status: DC
  Administered 2022-05-15: 10 mg via ORAL
  Filled 2022-05-15: qty 1

## 2022-05-15 MED ORDER — TRAMADOL HCL 50 MG PO TABS
50.0000 mg | ORAL_TABLET | Freq: Four times a day (QID) | ORAL | 0 refills | Status: AC | PRN
  Filled 2022-05-15: qty 10, 3d supply, fill #0

## 2022-05-15 MED ORDER — METOPROLOL SUCCINATE ER 25 MG PO TB24
25.0000 mg | ORAL_TABLET | Freq: Every day | ORAL | 1 refills | Status: DC
  Filled 2022-05-15: qty 30, 30d supply, fill #0

## 2022-05-15 MED ORDER — IRBESARTAN 300 MG PO TABS
300.0000 mg | ORAL_TABLET | Freq: Every day | ORAL | 1 refills | Status: DC
  Filled 2022-05-15: qty 30, 30d supply, fill #0

## 2022-05-15 MED ORDER — SPIRONOLACTONE 50 MG PO TABS
50.0000 mg | ORAL_TABLET | Freq: Every day | ORAL | 1 refills | Status: DC
  Filled 2022-05-15: qty 30, 30d supply, fill #0

## 2022-05-15 MED ORDER — METOPROLOL SUCCINATE ER 25 MG PO TB24
25.0000 mg | ORAL_TABLET | Freq: Every day | ORAL | Status: DC
  Administered 2022-05-15: 25 mg via ORAL
  Filled 2022-05-15: qty 1

## 2022-05-15 MED ORDER — FUROSEMIDE 40 MG PO TABS
40.0000 mg | ORAL_TABLET | Freq: Every day | ORAL | 1 refills | Status: DC
  Filled 2022-05-15: qty 30, 30d supply, fill #0

## 2022-05-15 MED ORDER — EMPAGLIFLOZIN 10 MG PO TABS
10.0000 mg | ORAL_TABLET | Freq: Every day | ORAL | 1 refills | Status: DC
  Filled 2022-05-15: qty 30, 30d supply, fill #0

## 2022-05-15 MED FILL — Lidocaine HCl Local Preservative Free (PF) Inj 1%: INTRAMUSCULAR | Qty: 30 | Status: AC

## 2022-05-15 NOTE — Progress Notes (Signed)
DAILY PROGRESS NOTE   Patient Name: Henry Chandler Date of Encounter: 05/15/2022 Cardiologist: None  Chief Complaint   No complaints  Patient Profile   Henry Chandler is a 41 y.o. male with a hx of uncontrolled HTN (dx around age 58), Covid PNA with associated respiratory failure requiring [place] O2 05/2019 (tx at OSH, c/b rhabdomyolysis and AKI), snoring, morbid obesity, tobacco abuse, habitual alcohol use (10-15 shots/week), family history of heart disease who is being seen 05/10/2022 for the evaluation of elevated troponin at the request of Dr. Roderic Palau.  Subjective   No issues overnight- cath yesterday showed no coronary disease. Appears to have a well-compensated cardiomyopathy at this point.   Objective   Vitals:   05/14/22 1538 05/14/22 1641 05/14/22 2200 05/15/22 0625  BP:  (!) 147/98 139/84 (!) 135/95  Pulse:  91 (!) 103 96  Resp:  16 18 18   Temp:  98.3 F (36.8 C) 98.3 F (36.8 C) 98 F (36.7 C)  TempSrc:  Oral Oral Oral  SpO2: 100% 96% 94% 93%  Weight:    (!) 153.3 kg  Height:        Intake/Output Summary (Last 24 hours) at 05/15/2022 0830 Last data filed at 05/15/2022 0700 Gross per 24 hour  Intake 720 ml  Output --  Net 720 ml   Filed Weights   05/13/22 0530 05/14/22 0641 05/15/22 0625  Weight: (!) 153.3 kg (!) 153.7 kg (!) 153.3 kg    Physical Exam   General appearance: alert, no distress, and morbidly obese Neck: no carotid bruit, no JVD, and thyroid not enlarged, symmetric, no tenderness/mass/nodules Lungs: clear to auscultation bilaterally Heart: regular rate and rhythm, S1, S2 normal, no murmur, click, rub or gallop Abdomen: soft, non-tender; bowel sounds normal; no masses,  no organomegaly Extremities: extremities normal, atraumatic, no cyanosis or edema Pulses: 2+ and symmetric Skin: Skin color, texture, turgor normal. No rashes or lesions Neurologic: Grossly normal Psych: Flat affect  Inpatient Medications    Scheduled Meds:  aspirin  81 mg Oral  Daily   atorvastatin  40 mg Oral q1800   enoxaparin (LOVENOX) injection  40 mg Subcutaneous Q24H   furosemide  40 mg Oral Daily   hydrALAZINE  25 mg Oral TID   irbesartan  300 mg Oral Daily   potassium chloride  40 mEq Oral Daily   sodium chloride flush  3 mL Intravenous Q12H   sodium chloride flush  3 mL Intravenous Q12H   sodium chloride flush  3 mL Intravenous Q12H   spironolactone  50 mg Oral Daily    Continuous Infusions:  sodium chloride     sodium chloride      PRN Meds: sodium chloride, sodium chloride, acetaminophen, hydrALAZINE, nitroGLYCERIN, ondansetron (ZOFRAN) IV, oxyCODONE, sodium chloride flush, sodium chloride flush   Labs   Results for orders placed or performed during the hospital encounter of 05/10/22 (from the past 48 hour(s))  CBC     Status: None   Collection Time: 05/14/22  6:58 AM  Result Value Ref Range   WBC 9.0 4.0 - 10.5 K/uL   RBC 5.80 4.22 - 5.81 MIL/uL   Hemoglobin 16.5 13.0 - 17.0 g/dL   HCT 48.7 39.0 - 52.0 %   MCV 84.0 80.0 - 100.0 fL   MCH 28.4 26.0 - 34.0 pg   MCHC 33.9 30.0 - 36.0 g/dL   RDW 14.6 11.5 - 15.5 %   Platelets 252 150 - 400 K/uL   nRBC 0.0 0.0 - 0.2 %  Comment: Performed at [facility], [place] [address] [place], [place] [id number]  Basic metabolic panel     Status: Abnormal   Collection Time: 05/14/22  6:58 AM  Result Value Ref Range   Sodium 138 135 - 145 mmol/L   Potassium 3.7 3.5 - 5.1 mmol/L   Chloride 106 98 - 111 mmol/L   CO2 23 22 - 32 mmol/L   Glucose, Bld 105 (H) 70 - 99 mg/dL    Comment: Glucose reference range applies only to samples taken after fasting for at least 8 hours.   BUN 13 6 - 20 mg/dL   Creatinine, Ser 1.07 0.61 - 1.24 mg/dL   Calcium 9.2 8.9 - 10.3 mg/dL   GFR, Estimated >60 >60 mL/min    Comment: (NOTE) Calculated using the CKD-EPI Creatinine Equation (2021)    Anion gap 9 5 - 15    Comment: Performed at [facility] [address] [place], [place] [address]  Magnesium      Status: None   Collection Time: 05/14/22  6:58 AM  Result Value Ref Range   Magnesium 1.9 1.7 - 2.4 mg/dL    Comment: Performed at [facility] [address] [place], [place] [address]  POCT I-Stat EG7     Status: Abnormal   Collection Time: 05/14/22  4:05 PM  Result Value Ref Range   pH, Ven 7.398 7.25 - 7.43   pCO2, Ven 42.4 (L) 44 - 60 mmHg   pO2, Ven 41 32 - 45 mmHg   Bicarbonate 26.1 20.0 - 28.0 mmol/L   TCO2 27 22 - 32 mmol/L   O2 Saturation 76 %   Acid-Base Excess 1.0 0.0 - 2.0 mmol/L   Sodium 141 135 - 145 mmol/L   Potassium 3.8 3.5 - 5.1 mmol/L   Calcium, Ion 1.26 1.15 - 1.40 mmol/L   HCT 49.0 39.0 - 52.0 %   Hemoglobin 16.7 13.0 - 17.0 g/dL   Sample type MIXED VENOUS SAMPLE   POCT I-Stat EG7     Status: Abnormal   Collection Time: 05/14/22  4:05 PM  Result Value Ref Range   pH, Ven 7.403 7.25 - 7.43   pCO2, Ven 42.4 (L) 44 - 60 mmHg   pO2, Ven 41 32 - 45 mmHg   Bicarbonate 26.4 20.0 - 28.0 mmol/L   TCO2 28 22 - 32 mmol/L   O2 Saturation 77 %   Acid-Base Excess 1.0 0.0 - 2.0 mmol/L   Sodium 142 135 - 145 mmol/L   Potassium 3.8 3.5 - 5.1 mmol/L   Calcium, Ion 1.24 1.15 - 1.40 mmol/L   HCT 49.0 39.0 - 52.0 %   Hemoglobin 16.7 13.0 - 17.0 g/dL   Sample type MIXED VENOUS SAMPLE   I-STAT 7, (LYTES, BLD GAS, ICA, H+H)     Status: Abnormal   Collection Time: 05/14/22  4:11 PM  Result Value Ref Range   pH, Arterial 7.384 7.35 - 7.45   pCO2 arterial 38.2 32 - 48 mmHg   pO2, Arterial 114 (H) 83 - 108 mmHg   Bicarbonate 22.8 20.0 - 28.0 mmol/L   TCO2 24 22 - 32 mmol/L   O2 Saturation 98 %   Acid-base deficit 2.0 0.0 - 2.0 mmol/L   Sodium 134 (L) 135 - 145 mmol/L   Potassium 3.7 3.5 - 5.1 mmol/L   Calcium, Ion 1.21 1.15 - 1.40 mmol/L   HCT 47.0 39.0 - 52.0 %   Hemoglobin 16.0 13.0 -  17.0 g/dL   Sample type ARTERIAL     ECG   N/A  Telemetry   Sinus rhythm/tachycardia with PVC's- Personally Reviewed  Radiology    CARDIAC CATHETERIZATION  Result  Date: 05/14/2022 CONCLUSIONS: Chronic systolic heart failure with elevated LVEDP of 28 mmHg., Nonischemic cardiomyopathy with compensated volume status given pulmonary wedge mean pressure of 13 mmHg. Right dominant normal coronary arteries. Normal pulmonary artery pressure. RECOMMENDATIONS: Four pillar therapy for systolic heart failure as tolerated by blood pressure. Blood pressure control Cessation of alcohol intake Weight loss Evaluation for sleep apnea    Cardiac Studies   See echo above  Assessment   Principal Problem:   Acute on chronic combined systolic and diastolic CHF (congestive heart failure) (HCC) Active Problems:   Hypertension   Hyperglycemia   Tobacco use   Dyspnea   Class 3 severe obesity due to excess calories with serious comorbidity and body mass index (BMI) of 40.0 to 44.9 in adult [facility])   Chest pain of uncertain etiology   History of medication noncompliance   Polysubstance abuse [facility])   Plan   Mr. Zuke has no angiographic coronary disease. He is now compensated from a heart failure standpoint on oral lasix.  BP improved, now on aldactone 50 mg daily. Potasssium 3.7 yesterday. HR remains in the 90's - add Toprol XL 25 mg daily as he is compensated. Start jardiance 10 mg daily. Consider switch of irbesartan to [facility] as an outpatient. Continue hydralazine - will start low dose nitrate. Some mild right arm pain post cath - will d/w hospitalist regarding a short course of tramadol - recommend hot compresses. Provide work excuse for 2 weeks out until follow-up with cardiology.   Ok to d/c home today from a cardiology standpoint.   Time Spent Directly with Patient:  I have spent a total of 25 minutes with the patient reviewing hospital notes, telemetry, EKGs, labs and examining the patient as well as establishing an assessment and plan that was discussed personally with the patient.  > 50% of time was spent in direct patient care.  Length of Stay:  LOS: 5 days    Pixie Casino, MD, [facility], [place] Director of the Advanced Lipid Disorders &  Cardiovascular Risk Reduction Clinic Diplomate of the American Board of Clinical Lipidology Attending Cardiologist  Direct Dial: [phone]  Fax: [phone]  Website:  [email or web address] 05/15/2022, 8:30 AM

## 2022-05-15 NOTE — Progress Notes (Incomplete)
Heart Failure Nurse Navigator Progress Note  PCP: Pcp, No PCP-Cardiologist: *** Admission Diagnosis: *** Admitted from: ***  Presentation:   Henry Chandler presented with ***  ECHO/ LVEF: ***  Clinical Course:  Past Medical History:  Diagnosis Date   Back pain    Habitual alcohol use    Hypertension    Morbid obesity (HCC)    Pneumonia due to COVID-19 virus 05/2019   Snoring    Suspected sleep apnea    Tobacco abuse      Social History   Socioeconomic History   Marital status: Married    Spouse name: Lameco   Number of children: 0   Years of education: GED   Highest education level: Not on file  Occupational History   Occupation: Occupational psychologist: [employer]  Tobacco Use   Smoking status: Every Day    Packs/day: 0.25    Years: 17.00    Pack years: 4.25    Types: Cigarettes   Smokeless tobacco: Never   Tobacco comments:    less than .5 PPD  Vaping Use   Vaping Use: Never used  Substance and Sexual Activity   Alcohol use: Yes    Alcohol/week: 10.0 - 15.0 standard drinks    Types: 10 - 15 Shots of liquor per week    Comment: 10 -15 shots per week   Drug use: Yes    Types: Marijuana    Comment: daily   Sexual activity: Yes    Birth control/protection: Pill  Other Topics Concern   Not on file  Social History Narrative   Regular exercise-yes   Caffeine Use-yes   Social Determinants of Health   Financial Resource Strain: Low Risk    Difficulty of Paying Living Expenses: Not very hard  Food Insecurity: No Food Insecurity   Worried About Programme researcher, broadcasting/film/video in the Last Year: Never true   Ran Out of Food in the Last Year: Never true  Transportation Needs: No Transportation Needs   Lack of Transportation (Medical): No   Lack of Transportation (Non-Medical): No  Physical Activity: Not on file  Stress: Not on file  Social Connections: Not on file    High Risk Criteria for Readmission and/or Poor Patient Outcomes: Heart failure hospital  admissions (last 6 months): ***  No Show rate: *** Difficult social situation: *** Demonstrates medication adherence: *** Primary Language: English Literacy level: ***  Barriers of Care:   ***  Considerations/Referrals:   Referral made to Heart Failure Pharmacist Stewardship: *** Referral made to Heart Failure CSW/NCM TOC: *** Referral made to Heart & Vascular TOC clinic: ***  Items for Follow-up on DC/TOC: ***   ***

## 2022-05-15 NOTE — Progress Notes (Signed)
Nursing DC note  Patient alert and oriented verbalized understanding of dc instructions.All belongings given to patient.

## 2022-05-15 NOTE — TOC Benefit Eligibility Note (Signed)
Patient Advocate Encounter  Prior Authorization for Jardiance 10MG  tablets has been approved.    PA# [id number] Effective dates: 05/15/2022 through 05/16/2023      Lyndel Safe, [place] Patient Advocate Specialist [place] Patient Advocate Team Direct Number: [phone]  Fax: [phone]

## 2022-05-15 NOTE — Discharge Summary (Signed)
Physician Discharge Summary  Gerrit Rafalski YEM:[id number] DOB: 1981-06-06 DOA: 05/10/2022  PCP: Pcp, No  Admit date: 05/10/2022  Discharge date: 05/15/2022  Admitted From: Home  Disposition:  Home.  Recommendations for Outpatient Follow-up:  Follow up with PCP in 1-2 weeks Please obtain BMP/CBC in one week Advised to follow-up with cardiology as scheduled. Patient's blood pressure medications were adjusted. Advised to follow-up with pulmonology for sleep studies.  Home Health: None Equipment/Devices:None  Discharge Condition: Stable CODE STATUS:Full code Diet recommendation: Heart Healthy   Brief [facility] Course: This 41 years old male with PMH significant for back pain, EtOH dependence, hypertension, morbid obesity and tobacco dependence presented in the ED with c/o: chest pain and shortness of breath. Patient reports having cough for about a month ago and for last 1 week he has developed shortness of breath.  He also noticed some chest tightness that radiates towards the back.  Patient has been using his regular inhalers without any relief.  Pain is associated with exertion.  Patient has been noncompliant with his medications. ED work-up shows elevated troponins.  Patient is admitted for further work-up. Cardiology is consulted , echocardiogram shows LVEF 20 to 25% consistent with cardiomyopathy.  Patient underwent left heart cath, found to have no coronary artery disease.  He is compensated from heart failure standpoint.  Patient is cleared from cardiology to be discharged.  Discharge medications: Aldactone 50 mg daily Toprol XL 25 mg daily Jardiance 10 mg daily Aspirin 81 mg daily Atorvastatin 40 mg daily Hydralazine 25 mg 3 times daily Imdur 30 mg daily Lasix 40 mg daily    Discharge Diagnoses:  Principal Problem:   Acute on chronic combined systolic and diastolic CHF (congestive heart failure) (HCC) Active Problems:   Hypertension   Hyperglycemia    Tobacco use   Dyspnea   Class 3 severe obesity due to excess calories with serious comorbidity and body mass index (BMI) of 40.0 to 44.9 in adult [facility])   Chest pain of uncertain etiology   History of medication noncompliance   Polysubstance abuse (HCC)  Chest pain: Suspected ACS. Patient presented with substernal chest pain, shortness of breath for 1 week.   Pain worsens with exertion,  gets better with rest.  Appears like angina. This is in the setting of medication noncompliance, not on any antihypertensive medications. EKG abnormal ST-T wave changes, troponin 63 > 70, BNP 536. Continue aspirin, statins.  He reports chest pain has improved. Echocardiogram shows EF 20 to 25% consistent with cardiomyopathy. Cardiology recommended ischemic work-up. Left heart cath shows no coronary artery disease.   Essential hypertension: Continue Avapro 300 mg daily, hydralazine as needed. Hydralazine 25 mg PO 3 times daily added for better blood pressure control. Continue to monitor blood pressure.   Hyperlipidemia: Continue Lipitor 40 mg daily.   Polysubstance abuse: Patient acknowledges being drinking on weekends and periodic THC use Cessation encouraged.  Urine drug screen negative.   Tobacco dependence: Counseling completed.  Patient not interested. Refuses nicotine patch   Morbid Obesity: Diet and exercise discussed in detail.  Weight loss encouraged.   Estimated body mass index is 44.71 kg/m as calculated from the following:   Height as of this encounter: 6\' 1"  (1.854 m).   Weight as of this encounter: 153.7 kg.   Discharge Instructions  Discharge Instructions     Call MD for:  difficulty breathing, headache or visual disturbances   Complete by: As directed    Call MD for:  persistant dizziness or  light-headedness   Complete by: As directed    Call MD for:  persistant nausea and vomiting   Complete by: As directed    Diet - low sodium heart healthy   Complete by: As  directed    Diet Carb Modified   Complete by: As directed    Discharge instructions   Complete by: As directed    Advised to follow-up with primary care physician in 1 week. Advised to follow-up with cardiology as scheduled. Patient's blood pressure medications were adjusted.   Increase activity slowly   Complete by: As directed       Allergies as of 05/15/2022       Reactions   Lisinopril Anaphylaxis, Swelling   Swelling of tongue        Medication List     STOP taking these medications    irbesartan-hydrochlorothiazide 300-12.5 MG tablet Commonly known as: AVALIDE       TAKE these medications    acetaminophen 325 MG tablet Commonly known as: TYLENOL Take 650 mg by mouth every 6 (six) hours as needed for mild pain.   atorvastatin 40 MG tablet Commonly known as: LIPITOR Take 1 tablet (40 mg total) by mouth daily at 6 PM.   furosemide 40 MG tablet Commonly known as: LASIX Take 1 tablet (40 mg total) by mouth daily. Start taking on: May 16, 2022   hydrALAZINE 25 MG tablet Commonly known as: APRESOLINE Take 1 tablet (25 mg total) by mouth 3 (three) times daily.   irbesartan 300 MG tablet Commonly known as: AVAPRO Take 1 tablet (300 mg total) by mouth daily. Start taking on: May 16, 2022   isosorbide mononitrate 30 MG 24 hr tablet Commonly known as: IMDUR Take 1 tablet (30 mg total) by mouth daily. Start taking on: May 16, 2022   Jardiance 10 MG Tabs tablet Generic drug: empagliflozin Take 1 tablet (10 mg total) by mouth daily. Start taking on: May 16, 2022   metoprolol succinate 25 MG 24 hr tablet Commonly known as: TOPROL-XL Take 1 tablet (25 mg total) by mouth daily. Start taking on: May 16, 2022   spironolactone 50 MG tablet Commonly known as: ALDACTONE Take 1 tablet (50 mg total) by mouth daily. Start taking on: May 16, 2022   traMADol 50 MG tablet Commonly known as: Ultram Take 1 tablet (50 mg total) by mouth every 6 (six) hours as  needed for up to 3 days.        Follow-up Information     Jake Bathe, MD Follow up in 1 week(s).   Specialty: Cardiology Contact information: [address] [place] [address] [phone]         [facility]. Go in 12 day(s).   Specialty: Cardiology Why: Hospital follow up PLEASE bring current medication list Free valet parking, [address], [address]. Contact information: [address] [id number] mc [place] [address] [phone]        [facility] Follow up in 1 week(s).   Specialty: Pulmonology Contact information: [address] [place] [address] [phone]               Allergies  Allergen Reactions   Lisinopril Anaphylaxis and Swelling    Swelling of tongue    Consultations: Cardiology   Procedures/Studies: DG Chest 2 View  Result Date: 05/10/2022 CLINICAL DATA:  Shortness of breath beginning couple weeks ago, coughing for  months, chest pain since last week, history hypertension EXAM: CHEST - 2 VIEW COMPARISON:  222 20,019 FINDINGS: Enlargement of cardiac silhouette. Mediastinal contours and pulmonary vascularity normal. RIGHT basilar interstitial infiltrate. Remaining lungs clear. No pleural effusion or pneumothorax. IMPRESSION: Enlargement of cardiac silhouette. RIGHT basilar is tissue infiltrate question pneumonia. Electronically Signed   By: Ulyses Southward M.D.   On: 05/10/2022 10:38   CARDIAC CATHETERIZATION  Result Date: 05/14/2022 CONCLUSIONS: Chronic systolic heart failure with elevated LVEDP of 28 mmHg., Nonischemic cardiomyopathy with compensated volume status given pulmonary wedge mean pressure of 13 mmHg. Right dominant normal coronary arteries. Normal pulmonary artery pressure. RECOMMENDATIONS: Four pillar therapy for systolic heart failure as tolerated by blood pressure. Blood pressure control  Cessation of alcohol intake Weight loss Evaluation for sleep apnea   ECHOCARDIOGRAM COMPLETE  Result Date: 05/11/2022    ECHOCARDIOGRAM REPORT   Patient Name:   [facility] Date of Exam: 05/10/2022 Medical Rec #:  [id number]   Height:       73.0 in Accession #:    [id number]  Weight:       346.1 lb Date of Birth:  Jan 01, 1981   BSA:          2.716 m Patient Age:    40 years    BP:           149/105 mmHg Patient Gender: M           HR:           92 bpm. Exam Location:  Inpatient Procedure: 2D Echo, Cardiac Doppler and Color Doppler Indications:    Dyspnea  History:        Patient has no prior history of Echocardiogram examinations.                 Risk Factors:Hypertension.  Sonographer:    Eduard Roux Referring Phys: [id number] DAYNA N DUNN IMPRESSIONS  1. Left ventricular ejection fraction, by estimation, is 20 to 25%. The left ventricle has severely decreased function. The left ventricle demonstrates global hypokinesis, worse anteriorly. The left ventricular internal cavity size was severely dilated.  There is moderate left ventricular hypertrophy. Left ventricular diastolic parameters are consistent with Grade II diastolic dysfunction (pseudonormalization). Elevated left ventricular end-diastolic pressure.  2. Right ventricular systolic function is low normal. The right ventricular size is normal. There is normal pulmonary artery systolic pressure. The estimated right ventricular systolic pressure is 33.9 mmHg.  3. The pericardial effusion is circumferential.  4. The mitral valve is abnormal. Mild mitral valve regurgitation.  5. The aortic valve is tricuspid. Aortic valve regurgitation is not visualized.  6. The inferior vena cava is normal in size with greater than 50% respiratory variability, suggesting right atrial pressure of 3 mmHg. Comparison(s): No prior Echocardiogram. FINDINGS  Left Ventricle: Left ventricular ejection fraction, by estimation, is 20 to 25%. The left ventricle has severely decreased  function. The left ventricle demonstrates global hypokinesis. The left ventricular internal cavity size was severely dilated. There is moderate left ventricular hypertrophy. Left ventricular diastolic parameters are consistent with Grade II diastolic dysfunction (pseudonormalization). Elevated left ventricular end-diastolic pressure. Right Ventricle: The right ventricular size is normal. No increase in right ventricular wall thickness. Right ventricular systolic function is low normal. There is normal pulmonary artery systolic pressure. The tricuspid regurgitant velocity is 2.78 m/s,  and with an assumed right atrial pressure of 3 mmHg, the estimated right ventricular systolic pressure is 33.9 mmHg. Left Atrium: Left atrial size was normal in size.  Right Atrium: Right atrial size was normal in size. Pericardium: Trivial pericardial effusion is present. The pericardial effusion is circumferential. Mitral Valve: The mitral valve is abnormal. There is mild thickening of the anterior and posterior mitral valve leaflet(s). Mild mitral valve regurgitation. Tricuspid Valve: The tricuspid valve is grossly normal. Tricuspid valve regurgitation is mild. Aortic Valve: The aortic valve is tricuspid. Aortic valve regurgitation is not visualized. Aortic valve peak gradient measures 5.9 mmHg. Pulmonic Valve: The pulmonic valve was normal in structure. Pulmonic valve regurgitation is not visualized. Aorta: The aortic root and ascending aorta are structurally normal, with no evidence of dilitation. Venous: The inferior vena cava is normal in size with greater than 50% respiratory variability, suggesting right atrial pressure of 3 mmHg. IAS/Shunts: No atrial level shunt detected by color flow Doppler.  LEFT VENTRICLE PLAX 2D LVIDd:         6.80 cm LVIDs:         6.20 cm LV PW:         1.50 cm LV IVS:        1.40 cm LVOT diam:     2.40 cm LV SV:         86 LV SV Index:   31 LVOT Area:     4.52 cm  IVC IVC diam: 1.80 cm LEFT ATRIUM              Index        RIGHT ATRIUM           Index LA diam:        5.80 cm 2.14 cm/m   RA Area:     14.00 cm LA Vol (A2C):   85.8 ml 31.59 ml/m  RA Volume:   29.70 ml  10.93 ml/m LA Vol (A4C):   75.2 ml 27.68 ml/m LA Biplane Vol: 82.6 ml 30.41 ml/m  AORTIC VALVE                 PULMONIC VALVE AV Area (Vmax): 4.30 cm     PV Vmax:       0.73 m/s AV Vmax:        121.00 cm/s  PV Peak grad:  2.1 mmHg AV Peak Grad:   5.9 mmHg LVOT Vmax:      115.00 cm/s LVOT Vmean:     64.000 cm/s LVOT VTI:       0.189 m  AORTA Ao Root diam: 3.60 cm Ao Asc diam:  3.60 cm MITRAL VALVE               TRICUSPID VALVE MV Area (PHT): 6.48 cm    TR Peak grad:   30.9 mmHg MV Decel Time: 117 msec    TR Vmax:        278.00 cm/s MV E velocity: 99.00 cm/s MV A velocity: 62.60 cm/s  SHUNTS MV E/A ratio:  1.58        Systemic VTI:  0.19 m                            Systemic Diam: 2.40 cm Zoila Shutter MD Electronically signed by Zoila Shutter MD Signature Date/Time: 05/11/2022/10:42:22 AM    Final     Left heart cath, echocardiogram   Subjective: Patient was seen and examined at bedside.  Overnight events noted.   Patient reports feeling better.  Patient wants to be discharged.  Discharge Exam: Vitals:   05/15/22 8185 05/15/22 6314  BP: (!) 135/95 (!) 142/102  Pulse: 96 95  Resp: 18 18  Temp: 98 F (36.7 C) 98.4 F (36.9 C)  SpO2: 93% 95%   Vitals:   05/14/22 1641 05/14/22 2200 05/15/22 0625 05/15/22 0912  BP: (!) 147/98 139/84 (!) 135/95 (!) 142/102  Pulse: 91 (!) 103 96 95  Resp: Temp: 98.3 F (36.8 C) 98.3 F (36.8 C) 98 F (36.7 C) 98.4 F (36.9 C)  TempSrc: Oral Oral Oral Oral  SpO2: 96% 94% 93% 95%  Weight:   (!) 153.3 kg   Height:        General: Pt is alert, awake, not in acute distress Cardiovascular: RRR, S1/S2 +, no rubs, no gallops Respiratory: CTA bilaterally, no wheezing, no rhonchi Abdominal: Soft, NT, ND, bowel sounds + Extremities: no edema, no cyanosis    The results of  significant diagnostics from this hospitalization (including imaging, microbiology, ancillary and laboratory) are listed below for reference.     Microbiology: Recent Results (from the past 240 hour(s))  SARS Coronavirus 2 by RT PCR (hospital order, performed in [facility] hospital lab) *cepheid single result test* Anterior Nasal Swab     Status: None   Collection Time: 05/10/22  1:47 PM   Specimen: Anterior Nasal Swab  Result Value Ref Range Status   SARS Coronavirus 2 by RT PCR NEGATIVE NEGATIVE Final    Comment: (NOTE) SARS-CoV-2 target nucleic acids are NOT DETECTED.  The SARS-CoV-2 RNA is generally detectable in upper and lower respiratory specimens during the acute phase of infection. The lowest concentration of SARS-CoV-2 viral copies this assay can detect is 250 copies / mL. A negative result does not preclude SARS-CoV-2 infection and should not be used as the sole basis for treatment or other patient management decisions.  A negative result may occur with improper specimen collection / handling, submission of specimen other than nasopharyngeal swab, presence of viral mutation(s) within the areas targeted by this assay, and inadequate number of viral copies (<250 copies / mL). A negative result must be combined with clinical observations, patient history, and epidemiological information.  Fact Sheet for Patients:   RoadLapTop.co.za  Fact Sheet for Healthcare Providers: http://kim-miller.com/  This test is not yet approved or  cleared by the [place] FDA and has been authorized for detection and/or diagnosis of SARS-CoV-2 by FDA under an Emergency Use Authorization (EUA).  This EUA will remain in effect (meaning this test can be used) for the duration of the COVID-19 declaration under Section 564(b)(1) of the Act, 21 U.S.C. section 360bbb-3(b)(1), unless the authorization is terminated or revoked sooner.  Performed at  [facility], [address] [place], [place] [address]      Labs: BNP (last 3 results) Recent Labs    05/10/22 1022  BNP 536.0*   Basic Metabolic Panel: Recent Labs  Lab 05/10/22 1022 05/10/22 1630 05/11/22 0309 05/12/22 0248 05/14/22 0658 05/14/22 1605 05/14/22 1611  NA 139  --  137 140 138 142  141 134*  K 3.0*  --  3.1* 3.4* 3.7 3.8  3.8 3.7  CL 105  --  103 107 106  --   --   CO2 26  --  --   --   GLUCOSE 146*  --  110* 115* 105*  --   --   BUN 9  --  --   --   CREATININE 1.08  --  1.10 1.19 1.07  --   --   CALCIUM 8.9  --  8.8* 9.2 9.2  --   --   MG  --  1.9 2.0 2.0 1.9  --   --   PHOS  --   --   --  4.3  --   --   --    Liver Function Tests: Recent Labs  Lab 05/10/22 1219  AST 21  ALT 25  ALKPHOS 70  BILITOT 0.4  PROT 7.0  ALBUMIN 3.6   No results for input(s): LIPASE, AMYLASE in the last 168 hours. No results for input(s): AMMONIA in the last 168 hours. CBC: Recent Labs  Lab 05/10/22 1022 05/11/22 0309 05/12/22 0248 05/14/22 0658 05/14/22 1605 05/14/22 1611  WBC 6.8 7.6 10.1 9.0  --   --   HGB 16.5 16.1 16.9 16.5 16.7  16.7 16.0  HCT 48.7 48.0 49.7 48.7 49.0  49.0 47.0  MCV 84.5 84.4 84.4 84.0  --   --   PLT 270 245 248 252  --   --    Cardiac Enzymes: No results for input(s): CKTOTAL, CKMB, CKMBINDEX, TROPONINI in the last 168 hours. BNP: Invalid input(s): POCBNP CBG: No results for input(s): GLUCAP in the last 168 hours. D-Dimer No results for input(s): DDIMER in the last 72 hours. Hgb A1c No results for input(s): HGBA1C in the last 72 hours. Lipid Profile No results for input(s): CHOL, HDL, LDLCALC, TRIG, CHOLHDL, LDLDIRECT in the last 72 hours. Thyroid function studies No results for input(s): TSH, T4TOTAL, T3FREE, THYROIDAB in the last 72 hours.  Invalid input(s): FREET3 Anemia work up No results for input(s): VITAMINB12, FOLATE, FERRITIN, TIBC, IRON, RETICCTPCT in the last 72  hours. Urinalysis    Component Value Date/Time   COLORURINE YELLOW 08/08/2017 0923   APPEARANCEUR Clear 04/20/2018 1027   LABSPEC 1.015 08/08/2017 0923   PHURINE 6.5 08/08/2017 0923   GLUCOSEU Negative 04/20/2018 1027   HGBUR TRACE (A) 08/08/2017 0923   BILIRUBINUR Negative 04/20/2018 1027   KETONESUR NEGATIVE 08/08/2017 0923   PROTEINUR Negative 04/20/2018 1027   PROTEINUR NEGATIVE 08/08/2017 0923   UROBILINOGEN 0.2 04/30/2017 0907   NITRITE Negative 04/20/2018 1027   NITRITE NEGATIVE 08/08/2017 0923   LEUKOCYTESUR Negative 04/20/2018 1027   Sepsis Labs Invalid input(s): PROCALCITONIN,  WBC,  LACTICIDVEN Microbiology Recent Results (from the past 240 hour(s))  SARS Coronavirus 2 by RT PCR (hospital order, performed in [facility] hospital lab) *cepheid single result test* Anterior Nasal Swab     Status: None   Collection Time: 05/10/22  1:47 PM   Specimen: Anterior Nasal Swab  Result Value Ref Range Status   SARS Coronavirus 2 by RT PCR NEGATIVE NEGATIVE Final    Comment: (NOTE) SARS-CoV-2 target nucleic acids are NOT DETECTED.  The SARS-CoV-2 RNA is generally detectable in upper and lower respiratory specimens during the acute phase of infection. The lowest concentration of SARS-CoV-2 viral copies this assay can detect is 250 copies / mL. A negative result does not preclude SARS-CoV-2 infection and should not be used as the sole basis for treatment or other patient management decisions.  A negative result may occur with improper specimen collection / handling, submission of specimen other than nasopharyngeal swab, presence of viral mutation(s) within the areas targeted by this assay, and inadequate number of viral copies (<250 copies / mL). A negative result must be combined with clinical observations, patient history, and epidemiological information.  Fact Sheet for Patients:   RoadLapTop.co.za  Fact Sheet for Healthcare  Providers: http://kim-miller.com/  This test is not yet approved or  cleared by the [place] FDA and has been authorized for detection and/or diagnosis of SARS-CoV-2 by FDA under an Emergency Use Authorization (EUA).  This EUA will remain in effect (meaning this test can be used) for the duration of the COVID-19 declaration under Section 564(b)(1) of the Act, 21 U.S.C. section 360bbb-3(b)(1), unless the authorization is terminated or revoked sooner.  Performed at [facility], [address] [place], [place] [address]      Time coordinating discharge: Over 30 minutes  SIGNED:   Cipriano Bunker, MD  [facility] 05/15/2022, 4:56 PM Pager   If 7PM-7AM, please contact night-coverage

## 2022-05-15 NOTE — Discharge Instructions (Signed)
Advised to follow-up with primary care physician in 1 week. Advised to follow-up with cardiology as scheduled. Patient's blood pressure medications were adjusted.

## 2022-05-15 NOTE — Progress Notes (Addendum)
Heart Failure Stewardship Pharmacist Progress Note   PCP: Pcp, No PCP-Cardiologist: Donato Schultz, MD    HPI:  41 yo M with PMH of uncontrolled HTN, morbid obesity, tobacco use, and habitual alcohol use. He presented to the ED on 5/26 following a PCP appt for chest tightness and hypertension. CXR with no edema but possible PNA. ECHO on 5/27 with LVEF of 20-25%, moderate LVH with G2DD and RV low normal. R/LHC on 5/30 with normal coronary arteries and elevated LVEDP. He had an ECHO in 12/2019 with LVEF 55-60% and then was lost to follow up without completing a sleep study. Notably noncompliant with medications.  Discharge HF Medications: Diuretic: furosemide 40 mg PO daily Beta Blocker: metoprolol XL 25 mg daily ACE/ARB/ARNI: irbesartan 300 mg daily Aldosterone Antagonist: spironolactone 50 mg daily SGLT2i: Jardiance 10 mg daily Other: hydralazine 25 mg TID, Imdur 30 mg daily  Prior to admission HF Medications: None  Pertinent Lab Values: As of 5/30: Serum creatinine 1.07, BUN 13, Potassium 3.8, Sodium 141, BNP 536, Magnesium 1.9, A1c 5.6  Vital Signs: Weight: 338 lbs (admission weight: 337 lbs) Blood pressure: 140/90s  Heart rate: 90-100s  I/O: not accurate  Medication Assistance / Insurance Benefits Check: Does the patient have prescription insurance?  Yes Type of insurance plan: Control and instrumentation engineer  Outpatient Pharmacy:  Prior to admission outpatient pharmacy: [facility] Is the patient willing to use [facility] pharmacy at discharge? Yes Is the patient willing to transition their outpatient pharmacy to utilize a [facility] outpatient pharmacy?   No - insurance charges higher for retail vs mail order    Assessment: 1. Acute systolic CHF (LVEF 20-25%), due to NICM - uncontrolled HTN. NYHA class II symptoms. - Continue furosemide 40 mg PO daily - Agree with adding metoprolol XL 25 mg daily - Continue irbesartan 300 mg daily - consider transitioning to Entresto 49/51 mg BID - Continue  spironolactone 50 mg daily - Agree with adding Jardiance 10 mg daily - Continue hydralazine 25 mg TID and Imdur 30 mg daily     Plan: 1) Medication changes recommended at this time: - Agree with changes as above - Stop irbesartan - Start Entresto 49/51 mg BID  2) Patient assistance: - Copays $0 - [place] required for Entresto if this is added  3)  Education  - Patient has been educated on current HF medications and potential additions to HF medication regimen - Patient verbalizes understanding that over the next few months, these medication doses may change and more medications may be added to optimize HF regimen - Patient has been educated on basic disease state pathophysiology and goals of therapy   Sharen Hones, PharmD, BCPS Heart Failure Engineer, building services Phone [phone]

## 2022-05-23 ENCOUNTER — Encounter: Payer: Self-pay | Admitting: Physician Assistant

## 2022-05-23 NOTE — Progress Notes (Signed)
Cardiology Office Note:    Date:  05/24/2022   ID:  Loistine Simas, DOB Nov 30, 1981, MRN [id number]  PCP:  Aviva Kluver  [facility] Providers Cardiologist:  Donato Schultz, MD    Referring MD: No ref. provider found   Chief Complaint:  Hospitalization Follow-up (CHF)    Patient Profile: (HFrEF) heart failure with reduced ejection fraction  Non-ischemic cardiomyopathy (cath 04/2022 - no CAD) Echocardiogram 04/2022: EF 20-25 Hypertension  Obesity  Hx of resp failure in setting of COVID-19 in 05/2019 c/b rhabdomyolysis, AKI +Cigs ETOH abuse  Hx of marijuana use  FHx of heart disease  Sister w heart Tx in 30s; mother w PCI  Suspected OSA Hx of angioedema with ACE (Lisinopril)  Prior CV Studies: RIGHT/LEFT HEART CATH AND CORONARY ANGIOGRAPHY 05/14/2022 CONCLUSIONS: Chronic systolic heart failure with elevated LVEDP of 28 mmHg., Normal coronary arteries  Nonischemic cardiomyopathy with compensated volume status given pulmonary wedge mean pressure of 13 mmHg. Right dominant normal coronary arteries. Normal pulmonary artery pressure.   ECHO COMPLETE WO IMAGING ENHANCING AGENT 05/10/2022 EF 20-25, global HK, worse anteriorly, moderate LVH, GRII DD, low normal RVSF, normal PASP, RVSP 33.9, trivial circumferential pericardial effusion, mild MR    Echocardiogram 12/28/2019 ([facility]) EF 55-60, GLS -18.9, normal RVSF, normal PASP   History of Present Illness:   Henry Chandler is a 41 y.o. male with the above problem list.  He was admitted 5/26-5/31 with chest pain and shortness of breath concerning for unstable angina.  He had mildly elevated hsTrops with no trend c/w myocardial injury in the setting of uncontrolled hypertension (BP 159/109 on admission).  Echocardiogram showed newly depressed LVF with EF 20-25.  He was started on furosemide and GDMT was titrated.  He was not placed on Entresto due to a prior allergic reaction to Lisinopril.  He was continued on Irbesartan as he has  tolerated this in the past.  Cardiac catheterization demonstrated no CAD.  He returns for f/u.    He is here today with his mother.  Since discharge from the hospital, he has been doing well.  He has not had significant shortness of breath.  He has not had chest pain.  He has not had syncope, leg edema.  He is tolerating all of his medications well.  He does note disturbed sleep.  He has a history of snoring as well as witnessed apnea.    Past Medical History:  Diagnosis Date   Back pain    Habitual alcohol use    HFrEF (heart failure with reduced ejection fraction) (HCC) 05/14/2022   Echocardiogram 04/2022: EF 20-25, RVSP 33.9, mild MR, effusion Cardiac catheterization 04/2022: no CAD    Hypertension    Morbid obesity (HCC)    Pneumonia due to COVID-19 virus 05/2019   Snoring    Suspected sleep apnea    Tobacco abuse    Current Medications: Current Meds  Medication Sig   acetaminophen (TYLENOL) 325 MG tablet Take 650 mg by mouth every 6 (six) hours as needed for mild pain.   atorvastatin (LIPITOR) 40 MG tablet Take 1 tablet (40 mg total) by mouth daily at 6 PM.   empagliflozin (JARDIANCE) 10 MG TABS tablet Take 1 tablet (10 mg total) by mouth daily.   furosemide (LASIX) 40 MG tablet Take 1 tablet (40 mg total) by mouth daily.   hydrALAZINE (APRESOLINE) 25 MG tablet Take 1 tablet (25 mg total) by mouth 3 (three) times daily.   irbesartan (AVAPRO) 300 MG tablet Take  1 tablet (300 mg total) by mouth daily.   isosorbide mononitrate (IMDUR) 30 MG 24 hr tablet Take 1 tablet (30 mg total) by mouth daily.   metoprolol succinate (TOPROL-XL) 50 MG 24 hr tablet Take 1 tablet (50 mg total) by mouth daily. Take with or immediately following a meal.   spironolactone (ALDACTONE) 50 MG tablet Take 1 tablet (50 mg total) by mouth daily.   traMADol (ULTRAM) 50 MG tablet Take 50 mg by mouth as needed.   [DISCONTINUED] metoprolol succinate (TOPROL-XL) 25 MG 24 hr tablet Take 1 tablet (25 mg total) by mouth  daily.    Allergies:   Lisinopril   Social History   Tobacco Use   Smoking status: Former    Packs/day: 0.25    Years: 17.00    Total pack years: 4.25    Types: Cigarettes   Smokeless tobacco: Never   Tobacco comments:    less than .5 PPD  Vaping Use   Vaping Use: Never used  Substance Use Topics   Alcohol use: Not Currently    Alcohol/week: 10.0 - 15.0 standard drinks of alcohol    Types: 10 - 15 Shots of liquor per week    Comment: 10 -15 shots per week   Drug use: Yes    Types: Marijuana    Comment: daily    Family Hx: The patient's family history includes Cancer in his mother; Heart disease in his mother and sister; Hyperlipidemia in his father and mother; Hypertension in his father and mother. There is no history of Diabetes or Stroke.  Review of Systems  Gastrointestinal:  Negative for hematochezia.  Genitourinary:  Negative for hematuria.     EKGs/Labs/Other Test Reviewed:    EKG:  EKG is not ordered today.  The ekg ordered today demonstrates n/a  Recent Labs: 05/10/2022: ALT 25; B Natriuretic Peptide 536.0; TSH 0.643 05/14/2022: BUN 13; Creatinine, Ser 1.07; Hemoglobin 16.0; Magnesium 1.9; Platelets 252; Potassium 3.7; Sodium 134   Recent Lipid Panel Recent Labs    05/11/22 0309  CHOL 130  TRIG 102  HDL 31*  VLDL 20  LDLCALC 79     Risk Assessment/Calculations/Metrics:              Physical Exam:    VS:  BP 122/82   Pulse 84   Ht 6\' 1"  (1.854 m)   Wt (!) 337 lb 3.2 oz (153 kg)   SpO2 96%   BMI 44.49 kg/m     Wt Readings from Last 3 Encounters:  05/24/22 (!) 337 lb 3.2 oz (153 kg)  05/15/22 (!) 338 lb (153.3 kg)  08/26/18 (!) 348 lb (157.9 kg)    Constitutional:      Appearance: Healthy appearance. Not in distress.  Neck:     Vascular: No JVR. JVD normal.  Pulmonary:     Effort: Pulmonary effort is normal.     Breath sounds: No wheezing. No rales.  Cardiovascular:     Normal rate. Regular rhythm. Normal S1. Normal S2.       Murmurs: There is no murmur.     Comments: Right wrist without hematoma Edema:    Peripheral edema absent.  Abdominal:     General: There is no distension.     Palpations: Abdomen is soft.  Skin:    General: Skin is warm and dry.  Neurological:     General: No focal deficit present.     Mental Status: Alert and oriented to person, place and time.  ASSESSMENT & PLAN:   HFrEF (heart failure with reduced ejection fraction) (HCC) EF 20-25.  Nonischemic cardiomyopathy.  NYHA I-II.  Volume status stable.  He is on good medical regimen currently and his blood pressures are much better controlled.  His heart rate is in the 80s upon auscultation today.  Given his young age, I have recommended that we proceed with a follow-up cardiac MRI instead of repeat echocardiogram to reassess his LV function.  We will not challenge him with Sherryll Burger as Sherryll Burger is contraindicated in patients who have a history of ACE inhibitor induced angioedema. Increase metoprolol succinate to 50 mg daily Continue empagliflozin 10 mg daily, furosemide 40 mg daily, hydralazine 25 mg 3 times a day, irbesartan 300 mg daily, isosorbide mononitrate 30 mg daily, spironolactone 50 mg daily BMET today Cardiac MRI 6 to 8 weeks Follow-up with Dr. Anne Fu in 3 FMLA forms filled out today (I advised him to avoid extreme straining until EF is reevaluated)  Hypertensive heart disease with CHF (congestive heart failure) (HCC) Better controlled.  Continue current medical regimen which includes hydralazine, irbesartan, isosorbide mononitrate, metoprolol succinate, spironolactone  Snoring He clearly has symptoms of sleep apnea.  Arrange split-night sleep study.  Tobacco use He notes that he is currently not smoking.           Dispo:  Return in about 3 months (around 08/24/2022) for Routine Follow Up w/ Dr. Anne Fu.   Medication Adjustments/Labs and Tests Ordered: Current medicines are reviewed at length with the patient today.   Concerns regarding medicines are outlined above.  Tests Ordered: Orders Placed This Encounter  Procedures   MR CARDIAC MORPHOLOGY W WO CONTRAST   Basic Metabolic Panel (BMET)   CBC   Split night study   Medication Changes: Meds ordered this encounter  Medications   metoprolol succinate (TOPROL-XL) 50 MG 24 hr tablet    Sig: Take 1 tablet (50 mg total) by mouth daily. Take with or immediately following a meal.    Dispense:  90 tablet    Refill:  3   Signed, Tereso Newcomer, PA-C  05/24/2022 12:25 PM    [facility] [address] [place], [place], [place]  [address] Phone: [phone]; Fax: [phone]

## 2022-05-24 ENCOUNTER — Encounter: Payer: Self-pay | Admitting: Physician Assistant

## 2022-05-24 ENCOUNTER — Telehealth: Payer: Self-pay | Admitting: Physician Assistant

## 2022-05-24 ENCOUNTER — Ambulatory Visit (INDEPENDENT_AMBULATORY_CARE_PROVIDER_SITE_OTHER): Payer: No Typology Code available for payment source | Admitting: Physician Assistant

## 2022-05-24 VITALS — BP 122/82 | HR 84 | Ht 73.0 in | Wt 337.2 lb

## 2022-05-24 DIAGNOSIS — Z720 Tobacco use: Secondary | ICD-10-CM | POA: Diagnosis not present

## 2022-05-24 DIAGNOSIS — I5020 Unspecified systolic (congestive) heart failure: Secondary | ICD-10-CM | POA: Diagnosis not present

## 2022-05-24 DIAGNOSIS — I110 Hypertensive heart disease with heart failure: Secondary | ICD-10-CM

## 2022-05-24 DIAGNOSIS — R0683 Snoring: Secondary | ICD-10-CM | POA: Diagnosis not present

## 2022-05-24 DIAGNOSIS — I5022 Chronic systolic (congestive) heart failure: Secondary | ICD-10-CM

## 2022-05-24 LAB — BASIC METABOLIC PANEL
BUN/Creatinine Ratio: 12 (ref 9–20)
BUN: 17 mg/dL (ref 6–20)
CO2: 20 mmol/L (ref 20–29)
Calcium: 9.8 mg/dL (ref 8.7–10.2)
Chloride: 104 mmol/L (ref 96–106)
Creatinine, Ser: 1.38 mg/dL — ABNORMAL HIGH (ref 0.76–1.27)
Glucose: 94 mg/dL (ref 70–99)
Potassium: 4.6 mmol/L (ref 3.5–5.2)
Sodium: 137 mmol/L (ref 134–144)
eGFR: 67 mL/min/{1.73_m2} (ref >59)

## 2022-05-24 MED ORDER — METOPROLOL SUCCINATE ER 50 MG PO TB24: 50.0000 mg | ORAL_TABLET | Freq: Every day | ORAL | 3 refills | Status: DC

## 2022-05-24 NOTE — Assessment & Plan Note (Addendum)
EF 20-25.  Nonischemic cardiomyopathy.  NYHA I-II.  Volume status stable.  He is on good medical regimen currently and his blood pressures are much better controlled.  His heart rate is in the 80s upon auscultation today.  Given his young age, I have recommended that we proceed with a follow-up cardiac MRI instead of repeat echocardiogram to reassess his LV function.  We will not challenge him with Sherryll Burger as Sherryll Burger is contraindicated in patients who have a history of ACE inhibitor induced angioedema.  Increase metoprolol succinate to 50 mg daily  Continue empagliflozin 10 mg daily, furosemide 40 mg daily, hydralazine 25 mg 3 times a day, irbesartan 300 mg daily, isosorbide mononitrate 30 mg daily, spironolactone 50 mg daily  BMET today  Cardiac MRI 6 to 8 weeks  Follow-up with Dr. Anne Fu in 3  FMLA forms filled out today (I advised him to avoid extreme straining until EF is reevaluated)

## 2022-05-24 NOTE — Telephone Encounter (Signed)
Forms with pt employer ([employer]) and records per Scott left at the front for the pt and faxed to the number provided on the forms.... copy sent to the pts chart.  Pt advised he can come to pick up.

## 2022-05-24 NOTE — Assessment & Plan Note (Signed)
He clearly has symptoms of sleep apnea.  Arrange split-night sleep study.

## 2022-05-24 NOTE — Assessment & Plan Note (Signed)
He notes that he is currently not smoking.

## 2022-05-24 NOTE — Telephone Encounter (Signed)
Ok.  I have his form but do not have his employer name.  Can you get the records (echocardiogram, cardiac cath reports) and we can leave for him at the desk? Tereso Newcomer, PA-C    05/24/2022 1:26 PM

## 2022-05-24 NOTE — Patient Instructions (Addendum)
Medication Instructions:  INCREASE TOPROL XL TO 50 MG DAILY *If you need a refill on your cardiac medications before your next appointment, please call your pharmacy*   Lab Work: BMET TODAY AND CBC IN 6-8 WEEKS (for mri)  If you have labs (blood work) drawn today and your tests are completely normal, you will receive your results only by: [place] (if you have MyChart) OR A paper copy in the mail If you have any lab test that is abnormal or we need to change your treatment, we will call you to review the results.   Testing/Procedures:  IN 6-8 WEEKS: Your physician has requested that you have a cardiac MRI. Cardiac MRI uses a computer to create images of your heart as its beating, producing both still and moving pictures of your heart and major blood vessels. For further information please visit [email or web address] Please follow the instruction sheet given to you today for more information.  Split night sleep study  Follow-Up: At [facility], you and your health needs are our priority.  As part of our continuing mission to provide you with exceptional heart care, we have created designated Provider Care Teams.  These Care Teams include your primary Cardiologist (physician) and Advanced Practice Providers (APPs -  Physician Assistants and Nurse Practitioners) who all work together to provide you with the care you need, when you need it.  We recommend signing up for the patient portal called "MyChart".  Sign up information is provided on this After Visit Summary.  MyChart is used to connect with patients for Virtual Visits (Telemedicine).  Patients are able to view lab/test results, encounter notes, upcoming appointments, etc.  Non-urgent messages can be sent to your provider as well.   To learn more about what you can do with MyChart, go to [email or web address].    Your next appointment:   3 month(s)  The format for your next appointment:   In Person  Provider:   Candee Furbish, MD     Other Instructions   Important Information About Sugar

## 2022-05-24 NOTE — Assessment & Plan Note (Signed)
Better controlled.  Continue current medical regimen which includes hydralazine, irbesartan, isosorbide mononitrate, metoprolol succinate, spironolactone

## 2022-05-24 NOTE — Telephone Encounter (Signed)
Patient left FMLA forms with Henry Chandler today to be complete.  He needs to write a claim number on the form #[id number].

## 2022-05-26 NOTE — Progress Notes (Incomplete)
HEART & VASCULAR TRANSITION OF CARE CONSULT NOTE     Referring Physician: Dr Anne Fu Primary Care: Maud Deed PA-C  Primary Cardiologist: Dr Anne Fu   HPI: Referred to clinic by Dr Anne Fu  for heart failure consultation.   Henry Chandler is a 41 year old with a history of OSA, obesity, angioedema after taking ACEi, NICM,  and former ETOH abuse. Also had Covid 2020 requiring admit in [place].  His sister had heart transplant (NICM Viral?) in her 30s.   Admitted 04/2022 with increased shortness of breath.  Echo EF 20-25%. Cath with normal cors and preserved cardiac output. Started on GDMT.   05/24/22. Saw [facility] PA-C. Toprol XL was increased to 50 mg daily. MRI was set up.   He presents today with his Mom. Overall feeling fine. Not walking much. Able to walk up 2 flights of stairs. Lives in an apartment with his wife on the the second floor. Denies SOB/PND/Orthopnea. No chest pain. Appetite ok. No fever or chills. Weight at home as been stable 334-336 pounds. Taking all medications. He has not been drinking alcohol.  Currently not working.   Cardiac Testing  RHC/LHC 04/2022 - Normal cors, CO 7.6, CI 2.8  Echo 04/2022 EF 20-25% global HK, moderate LVH,.  Echo 2021 EF 55-60%. At [facility]   Review of Systems: [y] = yes, [ ]  = no   General: Weight gain [ ] ; Weight loss [ ] ; Anorexia [ ] ; Fatigue [ ] ; Fever [ ] ; Chills [ ] ; Weakness [ ]   Cardiac: Chest pain/pressure [ ] ; Resting SOB [ ] ; Exertional SOB [ ] ; Orthopnea [ ] ; Pedal Edema [ ] ; Palpitations [ ] ; Syncope [ ] ; Presyncope [ ] ; Paroxysmal nocturnal dyspnea[ ]   Pulmonary: Cough [ ] ; Wheezing[ ] ; Hemoptysis[ ] ; Sputum [ ] ; Snoring [ ]   GI: Vomiting[ ] ; Dysphagia[ ] ; Melena[ ] ; Hematochezia [ ] ; Heartburn[ ] ; Abdominal pain [ ] ; Constipation [ ] ; Diarrhea [ ] ; BRBPR [ ]   GU: Hematuria[ ] ; Dysuria [ ] ; Nocturia[ ]   Vascular: Pain in legs with walking [ ] ; Pain in feet with lying flat [ ] ; Non-healing sores [ ] ; Stroke [ ] ; TIA [ ] ; Slurred  speech [ ] ;  Neuro: Headaches[ ] ; Vertigo[ ] ; Seizures[ ] ; Paresthesias[ ] ;Blurred vision [ ] ; Diplopia [ ] ; Vision changes [ ]   Ortho/Skin: Arthritis [ ] ; Joint pain [ ] ; Muscle pain [ ] ; Joint swelling [ ] ; Back Pain [ Y]; Rash [ ]   Psych: Depression[ ] ; Anxiety[ ]   Heme: Bleeding problems [ ] ; Clotting disorders [ ] ; Anemia [ ]   Endocrine: Diabetes [ ] ; Thyroid dysfunction[ ]    Past Medical History:  Diagnosis Date   Back pain    Habitual alcohol use    HFrEF (heart failure with reduced ejection fraction) (HCC) 05/14/2022   Echocardiogram 04/2022: EF 20-25, RVSP 33.9, mild Henry, effusion Cardiac catheterization 04/2022: no CAD    Hypertension    Morbid obesity (HCC)    Pneumonia due to COVID-19 virus 05/2019   Snoring    Suspected sleep apnea    Tobacco abuse     Current Outpatient Medications  Medication Sig Dispense Refill   acetaminophen (TYLENOL) 325 MG tablet Take 650 mg by mouth every 6 (six) hours as needed for mild pain.     atorvastatin (LIPITOR) 40 MG tablet Take 1 tablet (40 mg total) by mouth daily at 6 PM. 30 tablet 1   empagliflozin (JARDIANCE) 10 MG TABS tablet Take 1 tablet (10 mg total)  by mouth daily. 30 tablet 1   furosemide (LASIX) 40 MG tablet Take 1 tablet (40 mg total) by mouth daily. 30 tablet 1   hydrALAZINE (APRESOLINE) 25 MG tablet Take 1 tablet (25 mg total) by mouth 3 (three) times daily. 90 tablet 1   irbesartan (AVAPRO) 300 MG tablet Take 1 tablet (300 mg total) by mouth daily. 30 tablet 1   isosorbide mononitrate (IMDUR) 30 MG 24 hr tablet Take 1 tablet (30 mg total) by mouth daily. 30 tablet 1   metoprolol succinate (TOPROL-XL) 50 MG 24 hr tablet Take 1 tablet (50 mg total) by mouth daily. Take with or immediately following a meal. 90 tablet 3   spironolactone (ALDACTONE) 50 MG tablet Take 1 tablet (50 mg total) by mouth daily. 30 tablet 1   traMADol (ULTRAM) 50 MG tablet Take 50 mg by mouth as needed.     No current facility-administered medications  for this encounter.    Allergies  Allergen Reactions   Lisinopril Anaphylaxis and Swelling    Swelling of tongue      Social History   Socioeconomic History   Marital status: Married    Spouse name: Lameco   Number of children: 0   Years of education: GED   Highest education level: Not on file  Occupational History   Occupation: Occupational psychologist: [employer]  Tobacco Use   Smoking status: Former    Packs/day: 0.25    Years: 17.00    Total pack years: 4.25    Types: Cigarettes   Smokeless tobacco: Never   Tobacco comments:    less than .5 PPD  Vaping Use   Vaping Use: Never used  Substance and Sexual Activity   Alcohol use: Not Currently    Alcohol/week: 10.0 - 15.0 standard drinks of alcohol    Types: 10 - 15 Shots of liquor per week    Comment: 10 -15 shots per week   Drug use: Yes    Types: Marijuana    Comment: daily   Sexual activity: Yes    Birth control/protection: Pill  Other Topics Concern   Not on file  Social History Narrative   Regular exercise-yes   Caffeine Use-yes   Social Determinants of Health   Financial Resource Strain: Low Risk  (05/15/2022)   Overall Financial Resource Strain (CARDIA)    Difficulty of Paying Living Expenses: Not very hard  Food Insecurity: No Food Insecurity (05/15/2022)   Hunger Vital Sign    Worried About Running Out of Food in the Last Year: Never true    Ran Out of Food in the Last Year: Never true  Transportation Needs: No Transportation Needs (05/15/2022)   PRAPARE - Administrator, Civil Service (Medical): No    Lack of Transportation (Non-Medical): No  Physical Activity: Not on file  Stress: Not on file  Social Connections: Not on file  Intimate Partner Violence: Not on file      Family History  Problem Relation Age of Onset   Hypertension Mother    Cancer Mother        pancreas   Hyperlipidemia Mother    Heart disease Mother        ? stent? unclear details   Hypertension Father     Hyperlipidemia Father    Heart disease Sister        Had PNA -> had to have heart transplant   Diabetes Neg Hx    Stroke Neg Hx  Vitals:   05/27/22 1017  BP: (!) 120/94  Pulse: 90  SpO2: 96%  Weight: (!) 153.7 kg (338 lb 12.8 oz)   Wt Readings from Last 3 Encounters:  05/27/22 (!) 153.7 kg (338 lb 12.8 oz)  05/24/22 (!) 153 kg (337 lb 3.2 oz)  05/15/22 (!) 153.3 kg (338 lb)    PHYSICAL EXAM: General:  Well appearing. No respiratory difficulty HEENT: normal Neck: supple. no JVD. Carotids 2+ bilat; no bruits. No lymphadenopathy or thryomegaly appreciated. Cor: PMI nondisplaced. Regular rate & rhythm. No rubs, gallops or murmurs. Lungs: clear Abdomen: soft, nontender, nondistended. No hepatosplenomegaly. No bruits or masses. Good bowel sounds. Extremities: no cyanosis, clubbing, rash, edema Neuro: alert & oriented x 3, cranial nerves grossly intact. moves all 4 extremities w/o difficulty. Affect pleasant.   ASSESSMENT & PLAN: 1. HFrEF Echo 05/10/2022 EF 20-25%. Had cath 04/2022 EF 20-25%. CMRI ordered by Tereso Newcomer PA-C.  - NICM it is possible this is ? OSA/HTN versus possible genetic. His sister had heart transplant in her 30s possible due to virus. If EF remains reduced would benefit from genetic testing.  NYHA II GDMT  -Diuretic-Volume status stable. Continue lasix 40 mg daily. Discussed taking extra lasix for 3 pound weight gain in 24 hours.  -BB-Continue Metoprolol 50 mg daily  -Ace/ARB/ARNI- on irbesartan 300 mg daily.  Entresto contraindicated due to angioedema associated with lisinopril.  -MRA- Continue spironolactone 50 mg daily  -SGLT2i- Continue jardiance 10 mg daily  - Discussed with pharmacy. 0$ Co-pay for Bidil. Start Bidil 1 tab three times a day. Stop hydralazine and imdur.  - Discussed low salt food choices and limiting fluid intake < 2 liters per day.  - Will need repeat ECHO once HF meds optimized.   2. HTN -Elevated. See above switching to  Bidil.   3. OSA -Split study has been set up.   4. Obesity  Body mass index is 44.7 kg/m. -Discussed portion control.   Discussed medications changes and plan.   Referred to HFSW (PCP, Medications, Transportation, ETOH Abuse, Drug Abuse, Insurance, Financial ):  No Refer to Pharmacy:  No Refer to Home Health:  No Refer to Advanced Heart Failure Clinic: NO Refer to General Cardiology: He is established with Dr Anne Fu.   Follow up in TOC in 3 weeks then back to Dr Anne Fu.  Marland Kitchen

## 2022-05-27 ENCOUNTER — Ambulatory Visit (HOSPITAL_COMMUNITY)
Admit: 2022-05-27 | Discharge: 2022-05-27 | Disposition: A | Discharge status: Home / Self Care | Payer: No Typology Code available for payment source | Source: Ambulatory Visit | Attending: Adult Health | Admitting: Adult Health

## 2022-05-27 ENCOUNTER — Telehealth (HOSPITAL_COMMUNITY): Payer: Self-pay | Admitting: *Deleted

## 2022-05-27 ENCOUNTER — Encounter (HOSPITAL_COMMUNITY): Payer: Self-pay

## 2022-05-27 ENCOUNTER — Other Ambulatory Visit (HOSPITAL_COMMUNITY): Payer: Self-pay

## 2022-05-27 ENCOUNTER — Telehealth: Payer: Self-pay

## 2022-05-27 VITALS — BP 120/94 | HR 90 | Wt 338.8 lb

## 2022-05-27 DIAGNOSIS — I5020 Unspecified systolic (congestive) heart failure: Secondary | ICD-10-CM

## 2022-05-27 DIAGNOSIS — I428 Other cardiomyopathies: Secondary | ICD-10-CM | POA: Insufficient documentation

## 2022-05-27 DIAGNOSIS — Z7984 Long term (current) use of oral hypoglycemic drugs: Secondary | ICD-10-CM | POA: Insufficient documentation

## 2022-05-27 DIAGNOSIS — I5022 Chronic systolic (congestive) heart failure: Secondary | ICD-10-CM | POA: Insufficient documentation

## 2022-05-27 DIAGNOSIS — Z7901 Long term (current) use of anticoagulants: Secondary | ICD-10-CM | POA: Insufficient documentation

## 2022-05-27 DIAGNOSIS — Z79899 Other long term (current) drug therapy: Secondary | ICD-10-CM | POA: Insufficient documentation

## 2022-05-27 DIAGNOSIS — Z8616 Personal history of COVID-19: Secondary | ICD-10-CM | POA: Insufficient documentation

## 2022-05-27 DIAGNOSIS — I110 Hypertensive heart disease with heart failure: Secondary | ICD-10-CM

## 2022-05-27 DIAGNOSIS — Z6841 Body Mass Index (BMI) 40.0 and over, adult: Secondary | ICD-10-CM | POA: Diagnosis not present

## 2022-05-27 DIAGNOSIS — G4733 Obstructive sleep apnea (adult) (pediatric): Secondary | ICD-10-CM

## 2022-05-27 MED ORDER — ISOSORB DINITRATE-HYDRALAZINE 20-37.5 MG PO TABS: 1.0000 | ORAL_TABLET | Freq: Three times a day (TID) | ORAL | 3 refills | Status: DC

## 2022-05-27 NOTE — Patient Instructions (Signed)
Medication Changes:  START Bidil. One tablet THREE (3) times a day.   Once you start BIDIL, STOP hydralazine and imdur.    Special Instructions // Education:  Do the following things EVERYDAY: Weigh yourself in the morning before breakfast. Write it down and keep it in a log. Take your medicines as prescribed Eat low salt foods--Limit salt (sodium) to 2000 mg per day.  Stay as active as you can everyday Limit all fluids for the day to less than 2 liters  Follow-Up in: 3 weeks.   At the [facility], you and your health needs are our priority. We have a designated team specialized in the treatment of Heart Failure. This Care Team includes your primary Heart Failure Specialized Cardiologist (physician), Advanced Practice Providers (APPs- Physician Assistants and Nurse Practitioners), and Pharmacist who all work together to provide you with the care you need, when you need it.   You may see any of the following providers on your designated Care Team at your next follow up:  Dr Glori Bickers Dr Haynes Kerns, NP Lyda Jester, [place] [facility] [place], [place] Audry Riles, PharmD   Please be sure to bring in all your medications bottles to every appointment.   Need to Contact us:  If you have any questions or concerns before your next appointment please send [place] a message through [place] or call our office at [phone].    TO LEAVE A MESSAGE FOR THE NURSE SELECT OPTION 2, PLEASE LEAVE A MESSAGE INCLUDING: YOUR NAME DATE OF BIRTH CALL BACK NUMBER REASON FOR CALL**this is important as we prioritize the call backs  YOU WILL RECEIVE A CALL BACK THE SAME DAY AS LONG AS YOU CALL BEFORE 4:00 PM

## 2022-05-27 NOTE — Telephone Encounter (Signed)
-----   Message from Beatrice Lecher, PA-C sent at 05/27/2022  7:27 AM EDT ----- Creatinine increased.  K+ normal.  PLAN:  -Decrease Lasix to 20 mg once daily  -BMET 1 week -Call if weight increases > 3 lbs in 1 day or pt notes leg swelling/increased shortness of breath  Tereso Newcomer, PA-C    05/27/2022 7:25 AM

## 2022-05-27 NOTE — Telephone Encounter (Signed)
Left message for patient to call back  

## 2022-05-27 NOTE — Telephone Encounter (Signed)
Called to confirm Heart & Vascular Transitions of Care appointment at 10 am on 05/27/22. Patient reminded to bring all medications and pill box organizer with them. Confirmed patient has transportation. Gave directions, instructed to utilize valet parking.  Confirmed appointment prior to ending call.    Rhae Hammock, BSN, Scientist, clinical (histocompatibility and immunogenetics) Only

## 2022-05-31 ENCOUNTER — Encounter: Payer: Self-pay | Admitting: Pulmonary Disease

## 2022-05-31 ENCOUNTER — Ambulatory Visit (INDEPENDENT_AMBULATORY_CARE_PROVIDER_SITE_OTHER): Payer: No Typology Code available for payment source | Admitting: Pulmonary Disease

## 2022-05-31 VITALS — BP 126/80 | HR 92 | Temp 98.3°F | Ht 73.0 in | Wt 339.4 lb

## 2022-05-31 DIAGNOSIS — R0683 Snoring: Secondary | ICD-10-CM

## 2022-05-31 MED ORDER — FUROSEMIDE 20 MG PO TABS: 20.0000 mg | ORAL_TABLET | Freq: Every day | ORAL | 3 refills | Status: DC

## 2022-05-31 NOTE — Telephone Encounter (Signed)
Hold off on resuming for now.  Will see what repeat labs show. Have patient collect BPs and turn it when he comes in for BMET Tereso Newcomer, PA-C    05/31/2022 12:44 PM

## 2022-05-31 NOTE — Progress Notes (Signed)
Henry Chandler    [id number]    06-29-1981  Primary Care Physician:Pcp, No  Referring Physician: No referring provider defined for this encounter.  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%  History of hypertension, heart failure, Denies a history of diabetes  Active smoker, trying to cut down  Usually goes to bed between 10 and 2 AM, wakes up between 830 and 9 AM Multiple awakenings Falls asleep easily  Is tired during the day  Was hospitalized for COVID about a year ago, discharged home on oxygen supplementation which he was able to wean off He does have a sister who is s/p heart transplant for cardiomyopathy-viral cardiomyopathy  His recent cardiac cath was within normal limits  He is trying to increase his activity levels Tries to walk on a regular basis Denies any shortness of breath at rest, no orthopnea, no chest pain or chest discomfort  Admits to dryness of his mouth in the mornings Occasional choking episodes at night Occasional coughing at night No morning headaches  Mom does have obstructive sleep apnea on CPAP therapy  His recent echo with an ejection fraction of 20 to 25%, echo in 2021 with 55 to 60%  Outpatient Encounter Medications as of 05/31/2022  Medication Sig   acetaminophen (TYLENOL) 325 MG tablet Take 650 mg by mouth every 6 (six) hours as needed for mild pain.   atorvastatin (LIPITOR) 40 MG tablet Take 1 tablet (40 mg total) by mouth daily at 6 PM.   empagliflozin (JARDIANCE) 10 MG TABS tablet Take 1 tablet (10 mg total) by mouth daily.   furosemide (LASIX) 40 MG tablet Take 1 tablet (40 mg total) by mouth daily.   irbesartan (AVAPRO) 300 MG tablet Take 1 tablet (300 mg total) by mouth daily.   isosorbide-hydrALAZINE (BIDIL)  20-37.5 MG tablet Take 1 tablet by mouth 3 (three) times daily.   metoprolol succinate (TOPROL-XL) 50 MG 24 hr tablet Take 1 tablet (50 mg total) by mouth daily. Take with or immediately following a meal.   spironolactone (ALDACTONE) 50 MG tablet Take 1 tablet (50 mg total) by mouth daily.   traMADol (ULTRAM) 50 MG tablet Take 50 mg by mouth as needed.   No facility-administered encounter medications on file as of 05/31/2022.    Allergies as of 05/31/2022 - Review Complete 05/31/2022  Allergen Reaction Noted   Lisinopril Anaphylaxis and Swelling 06/07/2016    Past Medical History:  Diagnosis Date   Back pain    Habitual alcohol use    HFrEF (heart failure with reduced ejection fraction) (HCC) 05/14/2022   Echocardiogram 04/2022: EF 20-25, RVSP 33.9, mild MR, effusion Cardiac catheterization 04/2022: no CAD    Hypertension    Morbid obesity (HCC)    Pneumonia due to COVID-19 virus 05/2019   Snoring    Suspected sleep apnea    Tobacco abuse     Past Surgical History:  Procedure Laterality Date   RIGHT/LEFT HEART CATH AND CORONARY ANGIOGRAPHY N/A 05/14/2022   Procedure: RIGHT/LEFT HEART CATH AND CORONARY ANGIOGRAPHY;  Surgeon: Lyn Records, MD;  Location: [facility];  Service: Cardiovascular;  Laterality: N/A;    Family History  Problem Relation Age of Onset   Hypertension Mother    Cancer Mother  pancreas   Hyperlipidemia Mother    Heart disease Mother        ? stent? unclear details   Hypertension Father    Hyperlipidemia Father    Heart disease Sister        Had PNA -> had to have heart transplant   Diabetes Neg Hx    Stroke Neg Hx     Social History   Socioeconomic History   Marital status: Married    Spouse name: Lameco   Number of children: 0   Years of education: GED   Highest education level: Not on file  Occupational History   Occupation: Occupational psychologist: [employer]  Tobacco Use   Smoking status: Former    Packs/day: 0.25     Years: 17.00    Total pack years: 4.25    Types: Cigarettes   Smokeless tobacco: Never   Tobacco comments:    less than .5 PPD  Vaping Use   Vaping Use: Never used  Substance and Sexual Activity   Alcohol use: Not Currently    Alcohol/week: 10.0 - 15.0 standard drinks of alcohol    Types: 10 - 15 Shots of liquor per week    Comment: 10 -15 shots per week   Drug use: Yes    Types: Marijuana    Comment: daily   Sexual activity: Yes    Birth control/protection: Pill  Other Topics Concern   Not on file  Social History Narrative   Regular exercise-yes   Caffeine Use-yes   Social Determinants of Health   Financial Resource Strain: Low Risk  (05/15/2022)   Overall Financial Resource Strain (CARDIA)    Difficulty of Paying Living Expenses: Not very hard  Food Insecurity: No Food Insecurity (05/15/2022)   Hunger Vital Sign    Worried About Running Out of Food in the Last Year: Never true    Ran Out of Food in the Last Year: Never true  Transportation Needs: No Transportation Needs (05/15/2022)   PRAPARE - Administrator, Civil Service (Medical): No    Lack of Transportation (Non-Medical): No  Physical Activity: Not on file  Stress: Not on file  Social Connections: Not on file  Intimate Partner Violence: Not on file    Review of Systems  Constitutional:  Positive for fatigue.  Psychiatric/Behavioral:  Positive for sleep disturbance.     Vitals:   05/31/22 1011  BP: 126/80  Pulse: 92  Temp: 98.3 F (36.8 C)  SpO2: 99%     Physical Exam Constitutional:      Appearance: He is obese.  HENT:     Head: Normocephalic.  Eyes:     Pupils: Pupils are equal, round, and reactive to light.  Neck:     Thyroid: No thyromegaly.  Cardiovascular:     Rate and Rhythm: Normal rate and regular rhythm.  Pulmonary:     Effort: No tachypnea.     Breath sounds: No stridor. No decreased breath sounds, wheezing or rhonchi.  Chest:     Chest wall: No mass.   Musculoskeletal:        General: Normal range of motion.  Lymphadenopathy:     Cervical: No cervical adenopathy.  Neurological:     General: No focal deficit present.     Mental Status: He is alert.     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% on goal-directed medical treatment -On diuretics, beta-blockers, irbesartan, spironolactone -On SGLT2i  Patient is scheduled for a split-night study to assess for significant obstructive sleep apnea, there is potential he may have central sleep apnea as well with his heart failure  Pathophysiology of sleep disordered breathing discussed with the patient Treatment options for sleep disordered breathing discussed  History of past COVID infection with respiratory failure and decreased exercise tolerance post-COVID  Plan/Recommendations: Risks with not treating sleep apnea discussed with the patient  Risks associated with continuing to smoke discussed with the patient  Importance of weight loss and weight management discussed with the patient  Regular exercises encouraged  Follow-up following sleep study  Encouraged to call with any significant concerns   Virl Diamond MD [facility] 05/31/2022, 11:07 AM  CC: No ref. provider found

## 2022-05-31 NOTE — Telephone Encounter (Signed)
Patient aware we will not be filling irbesartan at this time.  He will begin monitoring BPs 1-2 times per day and drop off a list to [facility] when he comes next Friday for lab work.

## 2022-05-31 NOTE — Patient Instructions (Signed)
I will see you back in about 6 to 8 weeks  Schedule for in lab split-night study  Moderate probability of significant sleep apnea  Continue weight loss efforts  Continue to work on quitting smoking  Regular exercises  Continue follow-up with cardiology  Call us with significant concerns  Living With Sleep Apnea Sleep apnea is a condition in which breathing pauses or becomes shallow during sleep. Sleep apnea is most commonly caused by a collapsed or blocked airway. People with sleep apnea usually snore loudly. They may have times when they gasp and stop breathing for 10 seconds or more during sleep. This may happen many times during the night. The breaks in breathing also interrupt the deep sleep that you need to feel rested. Even if you do not completely wake up from the gaps in breathing, your sleep may not be restful and you feel tired during the day. You may also have a headache in the morning and low energy during the day, and you may feel anxious or depressed. How can sleep apnea affect me? Sleep apnea increases your chances of extreme tiredness during the day (daytime fatigue). It can also increase your risk for health conditions, such as: Heart attack. Stroke. Obesity. Type 2 diabetes. Heart failure. Irregular heartbeat. High blood pressure. If you have daytime fatigue as a result of sleep apnea, you may be more likely to: Perform poorly at school or work. Fall asleep while driving. Have difficulty with attention. Develop depression or anxiety. Have sexual dysfunction. What actions can I take to manage sleep apnea? Sleep apnea treatment  If you were given a device to open your airway while you sleep, use it only as told by your health care provider. You may be given: An oral appliance. This is a custom-made mouthpiece that shifts your lower jaw forward. A continuous positive airway pressure (CPAP) device. This device blows air through a mask when you breathe out  (exhale). A nasal expiratory positive airway pressure (EPAP) device. This device has valves that you put into each nostril. A bi-level positive airway pressure (BIPAP) device. This device blows air through a mask when you breathe in (inhale) and breathe out (exhale). You may need surgery if other treatments do not work for you. Sleep habits Go to sleep and wake up at the same time every day. This helps set your internal clock (circadian rhythm) for sleeping. If you stay up later than usual, such as on weekends, try to get up in the morning within 2 hours of your normal wake time. Try to get at least 7-9 hours of sleep each night. Stop using a computer, tablet, and mobile phone a few hours before bedtime. Do not take long naps during the day. If you nap, limit it to 30 minutes. Have a relaxing bedtime routine. Reading or listening to music may relax you and help you sleep. Use your bedroom only for sleep. Keep your television and computer out of your bedroom. Keep your bedroom cool, dark, and quiet. Use a supportive mattress and pillows. Follow your health care provider's instructions for other changes to sleep habits. Nutrition Do not eat heavy meals in the evening. Do not have caffeine in the later part of the day. The effects of caffeine can last for more than 5 hours. Follow your health care provider's or dietitian's instructions for any diet changes. Lifestyle     Do not drink alcohol before bedtime. Alcohol can cause you to fall asleep at first, but then it can  cause you to wake up in the middle of the night and have trouble getting back to sleep. Do not use any products that contain nicotine or tobacco. These products include cigarettes, chewing tobacco, and vaping devices, such as e-cigarettes. If you need help quitting, ask your health care provider. Medicines Take over-the-counter and prescription medicines only as told by your health care provider. Do not use over-the-counter  sleep medicine. You can become dependent on this medicine, and it can make sleep apnea worse. Do not use medicines, such as sedatives and narcotics, unless told by your health care provider. Activity Exercise on most days, but avoid exercising in the evening. Exercising near bedtime can interfere with sleeping. If possible, spend time outside every day. Natural light helps regulate your circadian rhythm. General information Lose weight if you need to, and maintain a healthy weight. Keep all follow-up visits. This is important. If you are having surgery, make sure to tell your health care provider that you have sleep apnea. You may need to bring your device with you. Where to find more information Learn more about sleep apnea and daytime fatigue from: American Sleep Association: sleepassociation.org National Sleep Foundation: sleepfoundation.org National Heart, Lung, and Blood Institute: [employer] Summary Sleep apnea is a condition in which breathing pauses or becomes shallow during sleep. Sleep apnea can cause daytime fatigue and other serious health conditions. You may need to wear a device while sleeping to help keep your airway open. If you are having surgery, make sure to tell your health care provider that you have sleep apnea. You may need to bring your device with you. Making changes to sleep habits, diet, lifestyle, and activity can help you manage sleep apnea. This information is not intended to replace advice given to you by your health care provider. Make sure you discuss any questions you have with your health care provider. Document Revised: 07/11/2021 Document Reviewed: 11/10/2020 Elsevier Patient Education  2023 [employer].

## 2022-05-31 NOTE — Telephone Encounter (Signed)
I spoke with the patient. He will decrease the lasix to 20 mg daily and call if increase in weight or SOB. Will come for lab work next Friday 06/07/22.  He asked about [place] sending irbesartan to his pharmacy that it is on his list but he never picked it up upon discharge.  I adv we would forward to APP for review in light of the creatinine increase and will call him back if we send it.

## 2022-06-03 ENCOUNTER — Telehealth: Payer: Self-pay

## 2022-06-03 NOTE — Telephone Encounter (Signed)
Patient had referral to our office for Sleep Consultation from [place], [place] - pt already established with [facility] and has sleep study orders through them. Patient denied being seen in our office due to receiving sleep care from [facility]

## 2022-06-05 ENCOUNTER — Telehealth: Payer: Self-pay | Admitting: Pulmonary Disease

## 2022-06-05 DIAGNOSIS — Z0279 Encounter for issue of other medical certificate: Secondary | ICD-10-CM

## 2022-06-05 NOTE — Telephone Encounter (Signed)
Patient dropped off forms to be filled out by Dr. Wynona Neat for his insurance- forms placed in Ao's box. Please advise.

## 2022-06-05 NOTE — Telephone Encounter (Signed)
Pt came in a dropped documents off to be signed by provider, Payment received, forms are in providers box.   Thank you

## 2022-06-05 NOTE — Telephone Encounter (Signed)
Henry Chandler,   Can you please inform patient that you received the paperwork or not the he dropped off up front  Please and thank you

## 2022-06-07 ENCOUNTER — Other Ambulatory Visit: Payer: No Typology Code available for payment source

## 2022-06-07 ENCOUNTER — Telehealth: Payer: Self-pay | Admitting: Physician Assistant

## 2022-06-07 DIAGNOSIS — I5020 Unspecified systolic (congestive) heart failure: Secondary | ICD-10-CM

## 2022-06-07 NOTE — Telephone Encounter (Signed)
The patient is aware that Cardiology will fill out the form. Nothing further needed.

## 2022-06-08 LAB — BASIC METABOLIC PANEL WITH GFR
BUN/Creatinine Ratio: 10 (ref 9–20)
BUN: 15 mg/dL (ref 6–20)
CO2: 20 mmol/L (ref 20–29)
Calcium: 9.4 mg/dL (ref 8.7–10.2)
Chloride: 106 mmol/L (ref 96–106)
Creatinine, Ser: 1.46 mg/dL — ABNORMAL HIGH (ref 0.76–1.27)
Glucose: 103 mg/dL — ABNORMAL HIGH (ref 70–99)
Potassium: 4.3 mmol/L (ref 3.5–5.2)
Sodium: 140 mmol/L (ref 134–144)
eGFR: 62 mL/min/1.73

## 2022-06-10 ENCOUNTER — Telehealth: Payer: Self-pay | Admitting: Physician Assistant

## 2022-06-10 ENCOUNTER — Telehealth: Payer: Self-pay | Admitting: *Deleted

## 2022-06-10 DIAGNOSIS — Z0279 Encounter for issue of other medical certificate: Secondary | ICD-10-CM

## 2022-06-10 DIAGNOSIS — Z79899 Other long term (current) drug therapy: Secondary | ICD-10-CM

## 2022-06-10 MED ORDER — SPIRONOLACTONE 50 MG PO TABS: 25.0000 mg | ORAL_TABLET | Freq: Every day | ORAL | 3 refills | Status: DC

## 2022-06-14 NOTE — Telephone Encounter (Signed)
Completed forms handed to Samul Dada, Passenger transport manager, 06/14/22.

## 2022-06-14 NOTE — Telephone Encounter (Signed)
Completed. Tereso Newcomer, PA-C    06/14/2022 2:05 PM

## 2022-06-14 NOTE — Telephone Encounter (Signed)
FORMS FAXED AND UPLOADED TO DOCUMENTS

## 2022-06-16 NOTE — Progress Notes (Signed)
HEART & VASCULAR TRANSITION OF CARE NOTE     Referring Physician: Dr Anne Fu Primary Care: Maud Deed PA-C  Primary Cardiologist: Dr Anne Fu   HPI: Referred to clinic by Dr Anne Fu  for heart failure consultation.   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 Also had Covid 2020 requiring admit in [place].  His sister had heart transplant (NICM Viral?) in her 30s.   Had previously seen at [facility] for resistant hypertension 12/20. Echo 01/21: EF 55-60. He did not follow-up after that visit. He stopped taking his blood pressure medications.  Admitted 04/2022 with increased shortness of breath and acute systolic CHF.  He was hypertensive. Echo EF 20-25%. Cath with normal cors and preserved cardiac output. Started on GDMT.   05/24/22. Saw [facility] PA-C. Toprol XL was increased to 50 mg daily. MRI was set up.   Seen in [facility] clinic for hospital follow-up 06/12. Imdur and hydralazine stopped and placed on bidil instead.  He had labs done with Cardiology on 06/25. Lasix stopped, spiro decreased to 25 mg daily and irbesartan held X 1 day d/t worsening Scr (1.07>1.38>1.46). He has f/u labs scheduled today.  He is here today for f/u and medication titration.  Reports he made the above changes except he is taking 20 mg furosemide daily. Denies dyspnea, orthopnea, PND or LE edema. His weight is up 8 lb since last visit which he attributes to caloric intake. He is watching sodium intake, does not use salt shaker when cooking. He admits to drinking a lot of fluids. Home blood pressures have been averaging around 130/80. No dizziness, presyncope or syncope. Reports occasional headache after medications.  Cardiac Testing  RHC/LHC 04/2022 - Normal cors, CO 7.6, CI 2.8  Echo 04/2022 EF 20-25% global HK, moderate LVH,.  Echo 2021 EF 55-60%. At [facility]   Review of Systems:  Cardiac and Respiratory. Negative except as mentioned in HPI.  Past Medical History:  Diagnosis Date   Back pain    Habitual alcohol use    HFrEF (heart failure with reduced ejection fraction) (HCC) 05/14/2022   Echocardiogram 04/2022: EF 20-25, RVSP 33.9, mild MR, effusion Cardiac catheterization 04/2022: no CAD    Hypertension    Morbid obesity (HCC)    Pneumonia due to COVID-19 virus 05/2019   Snoring    Suspected sleep apnea    Tobacco abuse     Current Outpatient Medications  Medication Sig Dispense Refill   acetaminophen (TYLENOL) 325 MG tablet Take 650 mg by mouth every 6 (six) hours as needed for mild pain.     atorvastatin (LIPITOR) 40 MG tablet Take 1 tablet (40 mg total) by mouth daily at 6 PM. 30 tablet 1   carvedilol (COREG) 6.25 MG tablet Take 1 tablet (6.25 mg total) by mouth 2 (two) times daily. 180 tablet 0   empagliflozin (JARDIANCE) 10 MG TABS tablet Take 1 tablet (10 mg total) by mouth daily. 30 tablet 1   furosemide (LASIX) 20 MG tablet Take 20 mg by mouth daily.     irbesartan (AVAPRO) 300 MG tablet Take 1 tablet (300 mg total) by mouth daily. 30 tablet 1   isosorbide-hydrALAZINE (BIDIL) 20-37.5 MG tablet Take 1 tablet by mouth 3 (three) times daily. 90 tablet 3   spironolactone (ALDACTONE) 50 MG tablet Take 0.5 tablets (25 mg total) by mouth daily. 45 tablet  3   traMADol (ULTRAM) 50 MG tablet Take 50 mg by mouth as needed.     No current facility-administered medications for this encounter.    Allergies  Allergen Reactions   Lisinopril Anaphylaxis and Swelling    Swelling of tongue      Social History   Socioeconomic History   Marital status: Married    Spouse name: Lameco   Number of children: 0   Years of education: GED   Highest education level: Not on file  Occupational History   Occupation: Occupational psychologist: [employer]  Tobacco Use   Smoking status: Former    Packs/day: 0.25    Years: 17.00    Total pack years: 4.25    Types:  Cigarettes   Smokeless tobacco: Never   Tobacco comments:    less than .5 PPD  Vaping Use   Vaping Use: Never used  Substance and Sexual Activity   Alcohol use: Not Currently    Alcohol/week: 10.0 - 15.0 standard drinks of alcohol    Types: 10 - 15 Shots of liquor per week    Comment: 10 -15 shots per week   Drug use: Yes    Types: Marijuana    Comment: daily   Sexual activity: Yes    Birth control/protection: Pill  Other Topics Concern   Not on file  Social History Narrative   Regular exercise-yes   Caffeine Use-yes   Social Determinants of Health   Financial Resource Strain: Low Risk  (05/15/2022)   Overall Financial Resource Strain (CARDIA)    Difficulty of Paying Living Expenses: Not very hard  Food Insecurity: No Food Insecurity (05/15/2022)   Hunger Vital Sign    Worried About Running Out of Food in the Last Year: Never true    Ran Out of Food in the Last Year: Never true  Transportation Needs: No Transportation Needs (05/15/2022)   PRAPARE - Administrator, Civil Service (Medical): No    Lack of Transportation (Non-Medical): No  Physical Activity: Not on file  Stress: Not on file  Social Connections: Not on file  Intimate Partner Violence: Not on file      Family History  Problem Relation Age of Onset   Hypertension Mother    Cancer Mother        pancreas   Hyperlipidemia Mother    Heart disease Mother        ? stent? unclear details   Hypertension Father    Hyperlipidemia Father    Heart disease Sister        Had PNA -> had to have heart transplant   Diabetes Neg Hx    Stroke Neg Hx      Vitals:   06/17/22 1038  BP: (!) 134/100  Pulse: 97  SpO2: 97%  Weight: (!) 157.3 kg (346 lb 12.8 oz)    Wt Readings from Last 3 Encounters:  06/17/22 (!) 157.3 kg (346 lb 12.8 oz)  05/31/22 (!) 154 kg (339 lb 6.4 oz)  05/27/22 (!) 153.7 kg (338 lb 12.8 oz)    PHYSICAL EXAM: General:  Well appearing. Ambulated into clinic. HEENT: normal Neck:  supple. Thick neck but JVP does not appear elevated. Carotids 2+ bilat; no bruits.  Cor: PMI nondisplaced. Regular rate & rhythm. No rubs, gallops or murmurs. Lungs: clear Abdomen: obese, soft, nontender, nondistended.  Extremities: no cyanosis, clubbing, rash, edema Neuro: alert & oriented x 3, cranial nerves grossly intact. moves all 4 extremities  w/o difficulty. Affect pleasant.   ASSESSMENT & PLAN: 1. HFrEF/NICM -Echo 05/10/2022 EF 20-25%.  -Etiology uncertain. ? Longstanding uncontrolled HTN +/- OSA versus possibly genetic. His sister had heart transplant in her 30s possibly due to myocarditis. If EF remains reduced would benefit from genetic testing.  -cMRI scheduled for next month NYHA II GDMT  -Diuretic-Volume status stable. On lasix 20 mg daily, had been instructed to stop by Cardiology but there was a misunderstanding with instructions. Continue for now pending results of BMET at Cardiology office today.  -BB-Switch metoprolol xl to coreg 6.25 BID for better BP control. -ARB-Continue irbesartan 300 mg daily, no ARNi d/t hx angioedema associated with lisinopril.  -MRA-Continue spironolactone 25 mg daily  -SGLT2i-Continue jardiance 10 mg daily  -Continue bidil 1 tab TID. Can consider switching to just hydralazine if headaches become bothersome. -Reiterated importance of low salt food choices and limiting fluid intake < 2 liters per day.   2. HTN -BP initially quite elevated in clinic. Improved on recheck but diastolic still high (134/100). -BP around 130/80 at home -Meds as above -Sleep study pending  3. OSA -Split night sleep study has been set up.   4. Obesity  -Body mass index is 45.75 kg/m. -Discussed portion control.     Referred to HFSW (PCP, Medications, Transportation, ETOH Abuse, Drug Abuse, Insurance, Financial ):  No Refer to Pharmacy:  No Refer to Home Health:  No Refer to Advanced Heart Failure Clinic: No Refer to General Cardiology: No, He is  established with Dr Anne Fu.   Follow up As needed, has f/u with Dr. Anne Fu 08/30/22 .

## 2022-06-17 ENCOUNTER — Other Ambulatory Visit: Payer: No Typology Code available for payment source

## 2022-06-17 ENCOUNTER — Telehealth (HOSPITAL_COMMUNITY): Payer: Self-pay | Admitting: *Deleted

## 2022-06-17 ENCOUNTER — Ambulatory Visit (HOSPITAL_COMMUNITY)
Admission: RE | Admit: 2022-06-17 | Discharge: 2022-06-17 | Disposition: A | Discharge status: Home / Self Care | Payer: BC Managed Care – PPO | Source: Ambulatory Visit | Attending: Physician Assistant | Admitting: Physician Assistant

## 2022-06-17 ENCOUNTER — Encounter (HOSPITAL_COMMUNITY): Payer: Self-pay

## 2022-06-17 VITALS — BP 134/100 | HR 97 | Wt 346.8 lb

## 2022-06-17 DIAGNOSIS — I110 Hypertensive heart disease with heart failure: Secondary | ICD-10-CM | POA: Insufficient documentation

## 2022-06-17 DIAGNOSIS — Z833 Family history of diabetes mellitus: Secondary | ICD-10-CM | POA: Diagnosis not present

## 2022-06-17 DIAGNOSIS — I5021 Acute systolic (congestive) heart failure: Secondary | ICD-10-CM | POA: Diagnosis not present

## 2022-06-17 DIAGNOSIS — F1010 Alcohol abuse, uncomplicated: Secondary | ICD-10-CM | POA: Insufficient documentation

## 2022-06-17 DIAGNOSIS — T465X6A Underdosing of other antihypertensive drugs, initial encounter: Secondary | ICD-10-CM | POA: Diagnosis not present

## 2022-06-17 DIAGNOSIS — Z6841 Body Mass Index (BMI) 40.0 and over, adult: Secondary | ICD-10-CM | POA: Diagnosis not present

## 2022-06-17 DIAGNOSIS — Z8349 Family history of other endocrine, nutritional and metabolic diseases: Secondary | ICD-10-CM | POA: Insufficient documentation

## 2022-06-17 DIAGNOSIS — Z87891 Personal history of nicotine dependence: Secondary | ICD-10-CM | POA: Diagnosis not present

## 2022-06-17 DIAGNOSIS — I10 Essential (primary) hypertension: Secondary | ICD-10-CM

## 2022-06-17 DIAGNOSIS — Z888 Allergy status to other drugs, medicaments and biological substances status: Secondary | ICD-10-CM | POA: Insufficient documentation

## 2022-06-17 DIAGNOSIS — Z8616 Personal history of COVID-19: Secondary | ICD-10-CM | POA: Insufficient documentation

## 2022-06-17 DIAGNOSIS — I5020 Unspecified systolic (congestive) heart failure: Secondary | ICD-10-CM | POA: Diagnosis not present

## 2022-06-17 DIAGNOSIS — Z7984 Long term (current) use of oral hypoglycemic drugs: Secondary | ICD-10-CM | POA: Insufficient documentation

## 2022-06-17 DIAGNOSIS — Z79899 Other long term (current) drug therapy: Secondary | ICD-10-CM | POA: Diagnosis not present

## 2022-06-17 DIAGNOSIS — G4733 Obstructive sleep apnea (adult) (pediatric): Secondary | ICD-10-CM | POA: Diagnosis not present

## 2022-06-17 DIAGNOSIS — Z8249 Family history of ischemic heart disease and other diseases of the circulatory system: Secondary | ICD-10-CM | POA: Diagnosis not present

## 2022-06-17 DIAGNOSIS — Z823 Family history of stroke: Secondary | ICD-10-CM | POA: Insufficient documentation

## 2022-06-17 DIAGNOSIS — I428 Other cardiomyopathies: Secondary | ICD-10-CM | POA: Diagnosis not present

## 2022-06-17 LAB — BASIC METABOLIC PANEL
BUN/Creatinine Ratio: 9 (ref 9–20)
BUN: 11 mg/dL (ref 6–20)
CO2: 22 mmol/L (ref 20–29)
Calcium: 9.5 mg/dL (ref 8.7–10.2)
Chloride: 102 mmol/L (ref 96–106)
Creatinine, Ser: 1.23 mg/dL (ref 0.76–1.27)
Glucose: 90 mg/dL (ref 70–99)
Potassium: 4.2 mmol/L (ref 3.5–5.2)
Sodium: 137 mmol/L (ref 134–144)
eGFR: 77 mL/min/{1.73_m2} (ref >59)

## 2022-06-17 MED ORDER — CARVEDILOL 6.25 MG PO TABS: 6.2500 mg | ORAL_TABLET | Freq: Two times a day (BID) | ORAL | 0 refills | Status: DC

## 2022-06-17 NOTE — Patient Instructions (Signed)
Great to see you today! Please stop Metoprolol and begin Coreg 6.25 mg twice daily Follow up with our clinic as needed.    Please keep appt with [facility] Dr Anne Fu

## 2022-06-17 NOTE — Telephone Encounter (Signed)
Call attempted to confirm HV TOC appt 06/17/22 @ 10 am. . HIPPA appropriate VM left with callback number.    Rhae Hammock, BSN, Scientist, clinical (histocompatibility and immunogenetics) Only

## 2022-06-20 NOTE — Progress Notes (Signed)
Pt has been made aware of normal result and verbalized understanding.  jw

## 2022-07-22 ENCOUNTER — Other Ambulatory Visit (HOSPITAL_COMMUNITY): Payer: No Typology Code available for payment source

## 2022-07-30 ENCOUNTER — Telehealth (HOSPITAL_COMMUNITY): Payer: Self-pay | Admitting: *Deleted

## 2022-07-30 NOTE — Telephone Encounter (Signed)
Reaching out to patient to offer assistance regarding upcoming cardiac imaging study; pt verbalizes understanding of appt date/time, parking situation and where to check in, and verified current allergies; name and call back number provided for further questions should they arise  Riyanna Crutchley RN Navigator Cardiac Imaging [facility] [phone] office [phone] cell  Patient denies claustrophobia and metal. 

## 2022-07-31 ENCOUNTER — Ambulatory Visit (HOSPITAL_COMMUNITY)
Admission: RE | Admit: 2022-07-31 | Discharge: 2022-07-31 | Disposition: A | Discharge status: Home / Self Care | Payer: BC Managed Care – PPO | Source: Ambulatory Visit | Attending: Physician Assistant | Admitting: Physician Assistant

## 2022-07-31 DIAGNOSIS — I110 Hypertensive heart disease with heart failure: Secondary | ICD-10-CM | POA: Diagnosis present

## 2022-07-31 DIAGNOSIS — I5020 Unspecified systolic (congestive) heart failure: Secondary | ICD-10-CM | POA: Insufficient documentation

## 2022-07-31 DIAGNOSIS — I5022 Chronic systolic (congestive) heart failure: Secondary | ICD-10-CM | POA: Insufficient documentation

## 2022-07-31 DIAGNOSIS — Z720 Tobacco use: Secondary | ICD-10-CM | POA: Insufficient documentation

## 2022-07-31 DIAGNOSIS — R0683 Snoring: Secondary | ICD-10-CM | POA: Diagnosis present

## 2022-07-31 MED ORDER — GADOBUTROL 1 MMOL/ML IV SOLN
10.0000 mL | Freq: Once | INTRAVENOUS | Status: AC | PRN
  Administered 2022-07-31: 10 mL via INTRAVENOUS

## 2022-08-02 ENCOUNTER — Other Ambulatory Visit: Payer: Self-pay

## 2022-08-02 DIAGNOSIS — I428 Other cardiomyopathies: Secondary | ICD-10-CM

## 2022-08-02 NOTE — Progress Notes (Signed)
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  Orders placed.

## 2022-08-04 ENCOUNTER — Ambulatory Visit (HOSPITAL_BASED_OUTPATIENT_CLINIC_OR_DEPARTMENT_OTHER): Payer: BC Managed Care – PPO | Attending: Physician Assistant | Admitting: Cardiology

## 2022-08-04 DIAGNOSIS — R0683 Snoring: Secondary | ICD-10-CM

## 2022-08-04 DIAGNOSIS — Z720 Tobacco use: Secondary | ICD-10-CM

## 2022-08-04 DIAGNOSIS — I5020 Unspecified systolic (congestive) heart failure: Secondary | ICD-10-CM | POA: Diagnosis not present

## 2022-08-04 DIAGNOSIS — I493 Ventricular premature depolarization: Secondary | ICD-10-CM | POA: Insufficient documentation

## 2022-08-04 DIAGNOSIS — G4733 Obstructive sleep apnea (adult) (pediatric): Secondary | ICD-10-CM | POA: Diagnosis not present

## 2022-08-04 DIAGNOSIS — I5022 Chronic systolic (congestive) heart failure: Secondary | ICD-10-CM | POA: Diagnosis not present

## 2022-08-04 DIAGNOSIS — I110 Hypertensive heart disease with heart failure: Secondary | ICD-10-CM

## 2022-08-05 NOTE — Procedures (Signed)
   Patient Name: Henry Chandler, Henry Chandler Study Date:12/02/2017 08/04/2022 Gender: Male D.O.B: [birth year] Age (years): 40 Referring Provider: Tereso Newcomer Height (inches): 71 Interpreting Physician: Armanda Magic MD, ABSM Weight (lbs): 350 RPSGT: Cherylann Parr BMI: 49 MRN: [id number] Neck Size: 20.00  CLINICAL INFORMATION Sleep Study Type: NPSG  Indication for sleep study: Congestive Heart Failure, Fatigue, Obesity, Snoring, Witnesses Apnea / Gasping During Sleep  Epworth Sleepiness Score: 13  SLEEP STUDY TECHNIQUE As per the AASM Manual for the Scoring of Sleep and Associated Events v2.3 (April 2016) with a hypopnea requiring 4% desaturations.  The channels recorded and monitored were frontal, central and occipital EEG, electrooculogram (EOG), submentalis EMG (chin), nasal and oral airflow, thoracic and abdominal wall motion, anterior tibialis EMG, snore microphone, electrocardiogram, and pulse oximetry.  MEDICATIONS Medications self-administered by patient taken the night of the study : N/A  SLEEP ARCHITECTURE The study was initiated at 10:38:38 PM and ended at 5:02:04 AM.  Sleep onset time was 22.4 minutes and the sleep efficiency was 81.6%. The total sleep time was 313 minutes.  Stage REM latency was 126.0 minutes.  The patient spent 6.2% of the night in stage N1 sleep, 85.1% in stage N2 sleep, 0.0% in stage N3 and 8.6% in REM.  Alpha intrusion was absent.  Supine sleep was 100.00%.  RESPIRATORY PARAMETERS The overall apnea/hypopnea index (AHI) was 11.7 per hour. There were 31 total apneas, including 31 obstructive, 0 central and 0 mixed apneas. There were 30 hypopneas and 70 RERAs.  The AHI during Stage REM sleep was 37.8 per hour.  AHI while supine was 11.7 per hour.  The mean oxygen saturation was 94.2%. The minimum SpO2 during sleep was 82.0%.  loud snoring was noted during this study.  CARDIAC DATA The 2 lead EKG demonstrated sinus rhythm. The mean heart rate was  67.2 beats per minute. Other EKG findings include: PVCs  LEG MOVEMENT DATA The total PLMS were 0 with a resulting PLMS index of 0.0. Associated arousal with leg movement index was 0.0 .  IMPRESSIONS - Mild obstructive sleep apnea occurred during this study (AHI = 11.7/h). - Mild oxygen desaturation was noted during this study (Min O2 = 82.0%). - The patient snored with loud snoring volume. - EKG findings include PVCs. - Clinically significant periodic limb movements did not occur during sleep. No significant associated arousals.  DIAGNOSIS - Obstructive Sleep Apnea (G47.33)  RECOMMENDATIONS - Therapeutic CPAP titration to determine optimal pressure required to alleviate sleep disordered breathing. - Positional therapy avoiding supine position during sleep. - Avoid alcohol, sedatives and other CNS depressants that may worsen sleep apnea and disrupt normal sleep architecture. - Sleep hygiene should be reviewed to assess factors that may improve sleep quality. - Weight management and regular exercise should be initiated or continued if appropriate.  [Electronically signed] 08/05/2022 09:48 AM  Armanda Magic MD, ABSM Diplomate, American Board of Sleep Medicine

## 2022-08-09 ENCOUNTER — Ambulatory Visit: Payer: No Typology Code available for payment source | Admitting: Pulmonary Disease

## 2022-08-28 ENCOUNTER — Telehealth: Payer: Self-pay | Admitting: *Deleted

## 2022-08-28 DIAGNOSIS — G4733 Obstructive sleep apnea (adult) (pediatric): Secondary | ICD-10-CM

## 2022-08-28 DIAGNOSIS — R0683 Snoring: Secondary | ICD-10-CM

## 2022-08-28 DIAGNOSIS — I10 Essential (primary) hypertension: Secondary | ICD-10-CM

## 2022-08-28 NOTE — Telephone Encounter (Signed)
-----   Message from Gaynelle Cage, [place] sent at 08/12/2022  2:27 PM EDT -----  ----- Message ----- From: Quintella Reichert, MD Sent: 08/05/2022   9:50 AM EDT To: [facility] Sleep Studies  Please let patient know that they have sleep apnea.  Recommend therapeutic CPAP titration for treatment of patient's sleep disordered breathing.  If unable to perform an in lab titration then initiate ResMed auto CPAP from 4 to 15cm H2O with heated humidity and mask of choice and overnight pulse ox on CPAP.

## 2022-08-28 NOTE — Telephone Encounter (Signed)
The patient has been notified of the result and verbalized understanding.  All questions (if any) were answered. Latrelle Dodrill, CMA 08/28/2022 5:22 PM    Precert titration

## 2022-08-30 ENCOUNTER — Ambulatory Visit: Payer: BC Managed Care – PPO | Admitting: Cardiology

## 2022-09-17 DIAGNOSIS — I428 Other cardiomyopathies: Secondary | ICD-10-CM | POA: Insufficient documentation

## 2022-09-17 NOTE — Progress Notes (Deleted)
ELECTROPHYSIOLOGY CONSULT NOTE  Patient ID: Henry Chandler, MRN: [id number], DOB/AGE: 1981-05-11 41 y.o. Admit date: (Not on file) Date of Consult: 09/17/2022  Primary Physician: Maud Deed, PA Primary Cardiologist: ***     Henry Chandler is a 41 y.o. male who is being seen today for the evaluation of *** at the request of ***.    HPI Henry Chandler is a 41 y.o. male ***  DATE TEST EF   ***/*** ***  *** %   ***/*** ***  *** %         Date Cr K Hgb  ***/*** *** *** ***  ***/*** *** *** ***      Past Medical History:  Diagnosis Date   Back pain    Habitual alcohol use    Henry (heart failure with reduced ejection fraction) (HCC) 05/14/2022   Echocardiogram 04/2022: EF 20-25, RVSP 33.9, mild MR, effusion Cardiac catheterization 04/2022: no CAD    Hypertension    Morbid obesity (HCC)    Pneumonia due to COVID-19 virus 05/2019   Snoring    Suspected sleep apnea    Tobacco abuse       Surgical History:  Past Surgical History:  Procedure Laterality Date   RIGHT/LEFT HEART CATH AND CORONARY ANGIOGRAPHY N/A 05/14/2022   Procedure: RIGHT/LEFT HEART CATH AND CORONARY ANGIOGRAPHY;  Surgeon: Lyn Records, MD;  Location: [facility];  Service: Cardiovascular;  Laterality: N/A;     Home Meds: No outpatient medications have been marked as taking for the 09/18/22 encounter (Appointment) with Duke Salvia, MD.    Allergies:  Allergies  Allergen Reactions   Lisinopril Anaphylaxis and Swelling    Swelling of tongue    Social History   Socioeconomic History   Marital status: Married    Spouse name: Lameco   Number of children: 0   Years of education: GED   Highest education level: Not on file  Occupational History   Occupation: Occupational psychologist: [employer]  Tobacco Use   Smoking status: Former    Packs/day: 0.25    Years: 17.00    Total pack years: 4.25    Types: Cigarettes   Smokeless tobacco: Never   Tobacco  comments:    less than .5 PPD  Vaping Use   Vaping Use: Never used  Substance and Sexual Activity   Alcohol use: Not Currently    Alcohol/week: 10.0 - 15.0 standard drinks of alcohol    Types: 10 - 15 Shots of liquor per week    Comment: 10 -15 shots per week   Drug use: Yes    Types: Marijuana    Comment: daily   Sexual activity: Yes    Birth control/protection: Pill  Other Topics Concern   Not on file  Social History Narrative   Regular exercise-yes   Caffeine Use-yes   Social Determinants of Health   Financial Resource Strain: Low Risk  (05/15/2022)   Overall Financial Resource Strain (CARDIA)    Difficulty of Paying Living Expenses: Not very hard  Food Insecurity: No Food Insecurity (05/15/2022)   Hunger Vital Sign    Worried About Running Out of Food in the Last Year: Never true    Ran Out of Food in the Last Year: Never true  Transportation Needs: No Transportation Needs (05/15/2022)   PRAPARE - Administrator, Civil Service (Medical): No    Lack of Transportation (Non-Medical): No  Physical Activity: Not on file  Stress: Not on file  Social Connections: Not on file  Intimate Partner Violence: Not on file     Family History  Problem Relation Age of Onset   Hypertension Mother    Cancer Mother        pancreas   Hyperlipidemia Mother    Heart disease Mother        ? stent? unclear details   Hypertension Father    Hyperlipidemia Father    Heart disease Sister        Had PNA -> had to have heart transplant   Diabetes Neg Hx    Stroke Neg Hx      ROS:  Please see the history of present illness.   {ros [id number]}  All other systems reviewed and negative.    Physical Exam:*** There were no vitals taken for this visit. General: Well developed, well nourished male in no acute distress. Head: Normocephalic, atraumatic, sclera non-icteric, no xanthomas, nares are without discharge. EENT: normal  Lymph Nodes:  none Neck: Negative for carotid  bruits. JVD not elevated. Back:without scoliosis kyphosis*** Lungs: Clear bilaterally to auscultation without wheezes, rales, or rhonchi. Breathing is unlabored. Heart: RRR with S1 S2. No *** ***/6 systolic*** murmur . No rubs, or gallops appreciated. Abdomen: Soft, non-tender, non-distended with normoactive bowel sounds. No hepatomegaly. No rebound/guarding. No obvious abdominal masses. Msk:  Strength and tone appear normal for age. Extremities: No clubbing or cyanosis. No*** ***+*** edema.  Distal pedal pulses are 2+ and equal bilaterally. Skin: Warm and Dry Neuro: Alert and oriented X 3. CN III-XII intact Grossly normal sensory and motor function . Psych:  Responds to questions appropriately with a normal affect.        EKG: ***   Assessment and Plan: *** Virl Axe

## 2022-09-18 ENCOUNTER — Institutional Professional Consult (permissible substitution): Payer: BC Managed Care – PPO | Admitting: Internal Medicine

## 2022-09-18 DIAGNOSIS — I428 Other cardiomyopathies: Secondary | ICD-10-CM

## 2022-09-19 ENCOUNTER — Telehealth (HOSPITAL_COMMUNITY): Payer: Self-pay | Admitting: Vascular Surgery

## 2022-09-19 NOTE — Telephone Encounter (Signed)
Left pt message to make new chf w/ AS next week

## 2022-10-07 ENCOUNTER — Other Ambulatory Visit (HOSPITAL_COMMUNITY): Payer: Self-pay | Admitting: Physician Assistant

## 2022-10-07 ENCOUNTER — Other Ambulatory Visit (HOSPITAL_COMMUNITY): Payer: Self-pay | Admitting: Adult Health

## 2022-10-31 ENCOUNTER — Ambulatory Visit (HOSPITAL_BASED_OUTPATIENT_CLINIC_OR_DEPARTMENT_OTHER): Payer: BC Managed Care – PPO | Admitting: Cardiology

## 2022-11-01 ENCOUNTER — Ambulatory Visit: Payer: BC Managed Care – PPO | Attending: Cardiology | Admitting: Cardiology

## 2022-11-01 ENCOUNTER — Encounter: Payer: Self-pay | Admitting: Cardiology

## 2022-11-01 VITALS — BP 114/82 | HR 83 | Ht 73.0 in | Wt 343.2 lb

## 2022-11-01 DIAGNOSIS — I110 Hypertensive heart disease with heart failure: Secondary | ICD-10-CM | POA: Diagnosis not present

## 2022-11-01 DIAGNOSIS — I5020 Unspecified systolic (congestive) heart failure: Secondary | ICD-10-CM

## 2022-11-01 DIAGNOSIS — I5022 Chronic systolic (congestive) heart failure: Secondary | ICD-10-CM

## 2022-11-01 DIAGNOSIS — I428 Other cardiomyopathies: Secondary | ICD-10-CM

## 2022-11-01 NOTE — Progress Notes (Unsigned)
Cardiology Office Note:    Date:  11/02/2022   ID:  Henry Chandler, DOB [birth year], MRN [id number]  PCP:  Maud Deed, PA   [facility] Providers Cardiologist:  Donato Schultz, MD     Referring MD: Maud Deed, [place]     History of Present Illness:    Henry Chandler is a 41 y.o. male with a hx of HLD, HTN, and HFrEF.  He was seen by Maud Deed, PA-C for follow-up on hypertension. He noted experiencing left shoulder pain that was present for a few days, no tingling or numbness and woke up with discomfort. The pain is exacerbated by movement and lifting. Anterior joint line was painful. A lipid panel was completed upon visit.  He was taking hypertensive medication and was compliant without any side effects. Denied any chest pain, dyspnea, edema, or TIA's. His home blood pressure readings were ~130/80.   He was recommended to ice and rest as well as stretching for his shoulder pain. He was advised to use NSAID PRN for pain.   Today, he has been doing well.   He noted that he has been doing well on medication regimens, denied any issues.  He noted that his blood pressure ranges around 85-95 systolic.   He has been consistent with physical activity such as going to the gym. Most of his wok out regimens consist of cardio but also include weight training.  He denies any palpitations, chest pain, shortness of breath, or peripheral edema. No lightheadedness, headaches, syncope, orthopnea, or PND.   Past Medical History:  Diagnosis Date   Back pain    Habitual alcohol use    HFrEF (heart failure with reduced ejection fraction) (HCC) 05/14/2022   Echocardiogram 04/2022: EF 20-25, RVSP 33.9, mild MR, effusion Cardiac catheterization 04/2022: no CAD    Hypertension    Morbid obesity (HCC)    Pneumonia due to COVID-19 virus 05/2019   Snoring    Suspected sleep apnea    Tobacco abuse     Past Surgical History:  Procedure Laterality Date   RIGHT/LEFT HEART  CATH AND CORONARY ANGIOGRAPHY N/A 05/14/2022   Procedure: RIGHT/LEFT HEART CATH AND CORONARY ANGIOGRAPHY;  Surgeon: Lyn Records, MD;  Location: [facility];  Service: Cardiovascular;  Laterality: N/A;    Current Medications: Current Meds  Medication Sig   acetaminophen (TYLENOL) 325 MG tablet Take 650 mg by mouth every 6 (six) hours as needed for mild pain.   atorvastatin (LIPITOR) 40 MG tablet Take 1 tablet (40 mg total) by mouth daily at 6 PM.   carvedilol (COREG) 6.25 MG tablet TAKE 1 TABLET(6.25 MG) BY MOUTH TWICE DAILY   empagliflozin (JARDIANCE) 10 MG TABS tablet Take 1 tablet (10 mg total) by mouth daily.   furosemide (LASIX) 20 MG tablet Take 20 mg by mouth daily.   irbesartan (AVAPRO) 300 MG tablet Take 1 tablet (300 mg total) by mouth daily.   isosorbide-hydrALAZINE (BIDIL) 20-37.5 MG tablet TAKE 1 TABLET BY MOUTH THREE TIMES DAILY   spironolactone (ALDACTONE) 50 MG tablet Take 0.5 tablets (25 mg total) by mouth daily.   traMADol (ULTRAM) 50 MG tablet Take 50 mg by mouth as needed.     Allergies:   Lisinopril   Social History   Socioeconomic History   Marital status: Married    Spouse name: Lameco   Number of children: 0   Years of education: GED   Highest education level: Not on file  Occupational History  Occupation: Psychologist, educational  Tobacco Use   Smoking status: Former    Packs/day: 0.25    Years: 17.00    Total pack years: 4.25    Types: Cigarettes   Smokeless tobacco: Never   Tobacco comments:    less than .5 PPD  Vaping Use   Vaping Use: Never used  Substance and Sexual Activity   Alcohol use: Not Currently    Alcohol/week: 10.0 - 15.0 standard drinks of alcohol    Types: 10 - 15 Shots of liquor per week    Comment: 10 -15 shots per week   Drug use: Yes    Types: Marijuana    Comment: daily   Sexual activity: Yes    Birth control/protection: Pill  Other Topics Concern   Not on file  Social History Narrative    Regular exercise-yes   Caffeine Use-yes   Social Determinants of Health   Financial Resource Strain: Low Risk  (05/15/2022)   Overall Financial Resource Strain (CARDIA)    Difficulty of Paying Living Expenses: Not very hard  Food Insecurity: No Food Insecurity (05/15/2022)   Hunger Vital Sign    Worried About Running Out of Food in the Last Year: Never true    Ran Out of Food in the Last Year: Never true  Transportation Needs: No Transportation Needs (05/15/2022)   PRAPARE - Administrator, Civil Service (Medical): No    Lack of Transportation (Non-Medical): No  Physical Activity: Not on file  Stress: Not on file  Social Connections: Not on file     Family History: The patient's family history includes Cancer in his mother; Heart disease in his mother and sister; Hyperlipidemia in his father and mother; Hypertension in his father and mother. There is no history of Diabetes or Stroke.  ROS:   Please see the history of present illness.     All other systems reviewed and are negative.  EKGs/Labs/Other Studies Reviewed:    The following studies were reviewed today:  MR Cardiac w/ Contrast 07/31/2022: IMPRESSION: 1. Severe LVE with global hypokinesis EF 26%   2. 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  No significant delayed gadolinium uptake   4.  Normal parametric measures see above   5.  Mild LAE   6.  Trivial appearing MR  Cardiac Catheterization 05/14/2022: CONCLUSIONS: Chronic systolic heart failure with elevated LVEDP of 28 mmHg., Nonischemic cardiomyopathy with compensated volume status given pulmonary wedge mean pressure of 13 mmHg. Right dominant normal coronary arteries. Normal pulmonary artery pressure.   Echo 05/10/2022: IMPRESSIONS    1. Left ventricular ejection fraction, by estimation, is 20 to 25%. The  left ventricle has severely decreased function. The left  ventricle  demonstrates global hypokinesis, worse anteriorly. The left ventricular  internal cavity size was severely dilated.   There is moderate left ventricular hypertrophy. Left ventricular  diastolic parameters are consistent with Grade II diastolic dysfunction  (pseudonormalization). Elevated left ventricular end-diastolic pressure.   2. Right ventricular systolic function is low normal. The right  ventricular size is normal. There is normal pulmonary artery systolic  pressure. The estimated right ventricular systolic pressure is 33.9 mmHg.   3. The pericardial effusion is circumferential.   4. The mitral valve is abnormal. Mild mitral valve regurgitation.   5. The aortic valve is tricuspid. Aortic valve regurgitation is not  visualized.   6. The inferior  vena cava is normal in size with greater than 50%  respiratory variability, suggesting right atrial pressure of 3 mmHg.     EKG: EKG is personally reviewed. 11/01/2022:EKG was not ordered. 05/14/2022: Sinus rhythm with occasional PVC. Rate 99 bpm.   Recent Labs: 05/10/2022: ALT 25; B Natriuretic Peptide 536.0; TSH 0.643 05/14/2022: Hemoglobin 16.0; Magnesium 1.9; Platelets 252 06/17/2022: BUN 11; Creatinine, Ser 1.23; Potassium 4.2; Sodium 137  Recent Lipid Panel    Component Value Date/Time   CHOL 130 05/11/2022 0309   TRIG 102 05/11/2022 0309   HDL 31 (L) 05/11/2022 0309   CHOLHDL 4.2 05/11/2022 0309   VLDL 20 05/11/2022 0309   LDLCALC 79 05/11/2022 0309             Physical Exam:    VS:  BP 114/82 (BP Location: Left Arm, Patient Position: Sitting, Cuff Size: Large)   Pulse 83   Ht 6\' 1"  (1.854 m)   Wt (!) 343 lb 4 oz (155.7 kg)   SpO2 94%   BMI 45.29 kg/m     Wt Readings from Last 3 Encounters:  11/01/22 (!) 343 lb 4 oz (155.7 kg)  08/04/22 (!) 350 lb (158.8 kg)  06/17/22 (!) 346 lb 12.8 oz (157.3 kg)     GEN:  Well nourished, well developed in no acute distress HEENT: Normal NECK: No JVD; No carotid  bruits LYMPHATICS: No lymphadenopathy CARDIAC: RRR, no murmurs, rubs, gallops RESPIRATORY:  Clear to auscultation without rales, wheezing or rhonchi  ABDOMEN: Soft, non-tender, non-distended MUSCULOSKELETAL:  No edema; No deformity  SKIN: Warm and dry NEUROLOGIC:  Alert and oriented x 3 PSYCHIATRIC:  Normal affect   ASSESSMENT:    1. HFrEF (heart failure with reduced ejection fraction) (HCC)   2. Nonischemic cardiomyopathy (HCC)   3. Hypertensive heart disease with chronic systolic congestive heart failure (HCC)    PLAN:    In order of problems listed above:  Chronic systolic heart failure/hypertension/morbid obesity - On carvedilol Jardiance spironolactone BiDil Lasix.  No side effects.  Last renal function was normal.  Normal potassium.  Continue.  MRI in August showed EF of 26%.  We will repeat echocardiogram.  Likely hypertensive cardiomyopathy or nonischemic.  Okay to workout at gym.  Continue to work on weight loss.  Appreciate advanced heart failure clinic consultation.  Reviewed note.       Follow-up: 67-months.  Medication Adjustments/Labs and Tests Ordered: Current medicines are reviewed at length with the patient today.  Concerns regarding medicines are outlined above.  Orders Placed This Encounter  Procedures   ECHOCARDIOGRAM COMPLETE   No orders of the defined types were placed in this encounter.   Patient Instructions  Medication Instructions:  Your physician recommends that you continue on your current medications as directed. Please refer to the Current Medication list given to you today.  *If you need a refill on your cardiac medications before your next appointment, please call your pharmacy*   Testing/Procedures: Your physician has requested that you have an echocardiogram. Echocardiography is a painless test that uses sound waves to create images of your heart. It provides your doctor with information about the size and shape of your heart and how well  your heart's chambers and valves are working. This procedure takes approximately one hour. There are no restrictions for this procedure. Please do NOT wear cologne, perfume, aftershave, or lotions (deodorant is allowed). Please arrive 15 minutes prior to your appointment time.    Follow-Up: At [facility], you  and your health needs are our priority.  As part of our continuing mission to provide you with exceptional heart care, we have created designated Provider Care Teams.  These Care Teams include your primary Cardiologist (physician) and Advanced Practice Providers (APPs -  Physician Assistants and Nurse Practitioners) who all work together to provide you with the care you need, when you need it.  Your next appointment:   6 month(s)  The format for your next appointment:   In Person  Provider:   Tereso Newcomer, PA-C     Then, Donato Schultz, MD will plan to see you again in 12 month(s).    Important Information About Sugar          I,Danny Valdes,acting as a scribe for [employer], MD.,have documented all relevant documentation on the behalf of Donato Schultz, MD,as directed by  Donato Schultz, MD while in the presence of Donato Schultz, MD.  I, Donato Schultz, MD, have reviewed all documentation for this visit. The documentation on 11/02/22 for the exam, diagnosis, procedures, and orders are all accurate and complete.   Signed, Donato Schultz, MD  11/02/2022 12:08 PM    [facility]

## 2022-11-01 NOTE — Patient Instructions (Signed)
Medication Instructions:  Your physician recommends that you continue on your current medications as directed. Please refer to the Current Medication list given to you today.  *If you need a refill on your cardiac medications before your next appointment, please call your pharmacy*   Testing/Procedures: Your physician has requested that you have an echocardiogram. Echocardiography is a painless test that uses sound waves to create images of your heart. It provides your doctor with information about the size and shape of your heart and how well your heart's chambers and valves are working. This procedure takes approximately one hour. There are no restrictions for this procedure. Please do NOT wear cologne, perfume, aftershave, or lotions (deodorant is allowed). Please arrive 15 minutes prior to your appointment time.    Follow-Up: At [facility], you and your health needs are our priority.  As part of our continuing mission to provide you with exceptional heart care, we have created designated Provider Care Teams.  These Care Teams include your primary Cardiologist (physician) and Advanced Practice Providers (APPs -  Physician Assistants and Nurse Practitioners) who all work together to provide you with the care you need, when you need it.  Your next appointment:   6 month(s)  The format for your next appointment:   In Person  Provider:   Tereso Newcomer, PA-C     Then, Donato Schultz, MD will plan to see you again in 12 month(s).    Important Information About Sugar

## 2022-11-13 ENCOUNTER — Institutional Professional Consult (permissible substitution): Payer: BC Managed Care – PPO | Admitting: Internal Medicine

## 2022-11-13 DIAGNOSIS — I428 Other cardiomyopathies: Secondary | ICD-10-CM

## 2022-11-15 ENCOUNTER — Ambulatory Visit (HOSPITAL_BASED_OUTPATIENT_CLINIC_OR_DEPARTMENT_OTHER): Payer: BC Managed Care – PPO | Attending: Cardiology | Admitting: Cardiology

## 2022-11-15 VITALS — Ht 73.0 in | Wt 343.0 lb

## 2022-11-15 DIAGNOSIS — I10 Essential (primary) hypertension: Secondary | ICD-10-CM | POA: Diagnosis not present

## 2022-11-15 DIAGNOSIS — G4733 Obstructive sleep apnea (adult) (pediatric): Secondary | ICD-10-CM | POA: Insufficient documentation

## 2022-11-15 DIAGNOSIS — R0683 Snoring: Secondary | ICD-10-CM | POA: Insufficient documentation

## 2022-11-18 NOTE — Procedures (Signed)
   Patient Name: Henry Chandler, Henry Chandler Date: 11/15/2022 Gender: Male D.O.B: 1981-05-30 Age (years): 41 Referring Provider: Tereso Newcomer Height (inches): 71 Interpreting Physician: Armanda Magic MD, ABSM Weight (lbs): 343 RPSGT: Cherylann Parr BMI: 48 MRN: [id number] Neck Size: 20.00  CLINICAL INFORMATION The patient is referred for a CPAP titration to treat sleep apnea.  SLEEP STUDY TECHNIQUE As per the AASM Manual for the Scoring of Sleep and Associated Events v2.3 (April 2016) with a hypopnea requiring 4% desaturations.  The channels recorded and monitored were frontal, central and occipital EEG, electrooculogram (EOG), submentalis EMG (chin), nasal and oral airflow, thoracic and abdominal wall motion, anterior tibialis EMG, snore microphone, electrocardiogram, and pulse oximetry. Continuous positive airway pressure (CPAP) was initiated at the beginning of the study and titrated to treat sleep-disordered breathing.  MEDICATIONS Medications self-administered by patient taken the night of the study : N/A  TECHNICIAN COMMENTS Comments added by technician: Patient tolerated the mask Comments added by scorer: N/A  RESPIRATORY PARAMETERS Optimal PAP Pressure (cm):11  AHI at Optimal Pressure (/hr):0 Overall Minimal O2 (%):84.0  Supine % at Optimal Pressure (%):100 Minimal O2 at Optimal Pressure (%): 89.0   SLEEP ARCHITECTURE The study was initiated at 11:04:19 PM and ended at 5:04:49 AM.  Sleep onset time was 0.1 minutes and the sleep efficiency was 97.2%. The total sleep time was 350.5 minutes.  The patient spent 2.1% of the night in stage N1 sleep, 84.0% in stage N2 sleep, 0.0% in stage N3 and 13.8% in REM.Stage REM latency was 80.0 minutes  Wake after sleep onset was 9.9. Alpha intrusion was absent. Supine sleep was 100.00%.  CARDIAC DATA The 2 lead EKG demonstrated sinus rhythm. The mean heart rate was 79.8 beats per minute. Other EKG findings include: None.  LEG MOVEMENT  DATA The total Periodic Limb Movements of Sleep (PLMS) were 0. The PLMS index was 0.0. A PLMS index of <15 is considered normal in adults.  IMPRESSIONS - The optimal PAP pressure was 11 cm of water. - Moderate oxygen desaturations were observed during this titration (min O2 = 84.0%). - No snoring was audible during this study. - No cardiac abnormalities were observed during this study. - Clinically significant periodic limb movements were not noted during this study. Arousals associated with PLMs were rare.  DIAGNOSIS - Obstructive Sleep Apnea (G47.33)  RECOMMENDATIONS - Trial of CPAP therapy on 11 cm H2O with a Large size Fisher&Paykel Full Face Simplus mask and heated humidification. - Avoid alcohol, sedatives and other CNS depressants that may worsen sleep apnea and disrupt normal sleep architecture. - Sleep hygiene should be reviewed to assess factors that may improve sleep quality. - Weight management and regular exercise should be initiated or continued. - Return to Sleep Center for re-evaluation after 6 weeks of therapy  [Electronically signed] 11/18/2022 09:59 PM  Armanda Magic MD, ABSM Diplomate, American Board of Sleep Medicine

## 2022-11-21 ENCOUNTER — Telehealth: Payer: Self-pay | Admitting: *Deleted

## 2022-11-21 NOTE — Telephone Encounter (Signed)
-----   Message from Gaynelle Cage, CMA sent at 11/19/2022 10:25 AM EST -----  ----- Message ----- From: Quintella Reichert, MD Sent: 11/18/2022  10:01 PM EST To: Cv Div Sleep Studies  Please let patient know that they had a successful PAP titration and let DME know that orders are in EPIC.  Please set up 6 week OV with me.

## 2022-11-21 NOTE — Telephone Encounter (Signed)
The patient has been notified of the result and verbalized understanding.  All questions (if any) were answered. Latrelle Dodrill, CMA 11/21/2022 4:41 PM   Upon patient request DME selection is [facility]. Patient understands he will be contacted by [facility] to set up his cpap. Patient understands to call if [facility] does not contact him with new setup in a timely manner. Patient understands they will be called once confirmation has been received from [facility]/ that they have received their new machine to schedule 10 week follow up appointment.   [facility] notified of new cpap order  Please add to airview Patient was grateful for the call and thanked me.

## 2022-11-29 ENCOUNTER — Other Ambulatory Visit (HOSPITAL_COMMUNITY): Payer: BC Managed Care – PPO

## 2022-12-03 ENCOUNTER — Encounter (HOSPITAL_COMMUNITY): Payer: Self-pay | Admitting: Cardiology

## 2022-12-19 ENCOUNTER — Telehealth (HOSPITAL_COMMUNITY): Payer: Self-pay | Admitting: Surgery

## 2022-12-19 NOTE — Telephone Encounter (Signed)
I called patient regarding re-referral to the [facility]. I left a message requesting a call back to schedule the appt.

## 2022-12-20 ENCOUNTER — Telehealth: Payer: Self-pay | Admitting: Cardiology

## 2022-12-20 NOTE — Telephone Encounter (Addendum)
Spoke with pt's mother, DPR who reports pt was seen in the ED today for cough and sore throat.  Pt was started on an antibiotic and inhaler for acute process.  Covid and Flu swabs negative.  Pt's mother is concerned pt may be fluid overloaded and that is the reason for his cough.  Advised pt's chest xray negative.  Pt's mother has advised pt to monitor for swelling and limit his fluid intake.  Advised to have pt continue with prescribed medications.  Pt has been re-referred to HF clinic and has been contacted by the clinic re:an appointment and message was left.  Provided HF clinic phone number and encouraged to have pt contact that clinic to schedule appointment for further evaluation and monitoring.  If S/Sx worsen contact office.  Keep appointments as scheduled with Dr Caryl Comes on 01/02/2023.  Pt's mother verbalizes understanding and agrees with current plan.

## 2022-12-20 NOTE — Telephone Encounter (Signed)
Pt c/o medication issue:  1. Name of Medication:  furosemide (LASIX) 20 MG tablet  2. How are you currently taking this medication (dosage and times per day)?   3. Are you having a reaction (difficulty breathing--STAT)?   4. What is your medication issue?   Patient's mother is requesting to speak with Dr. Marlou Porch' nurse. She states the patient was recently admitted at [facility] with a cough and sore throat and she worries that he may need to have his fluid pill increased.

## 2022-12-23 ENCOUNTER — Telehealth (HOSPITAL_COMMUNITY): Payer: Self-pay | Admitting: Vascular Surgery

## 2022-12-23 NOTE — Telephone Encounter (Signed)
LVM to make pt new chf appt w/ [facility]

## 2023-01-02 ENCOUNTER — Ambulatory Visit: Payer: BC Managed Care – PPO | Admitting: Internal Medicine

## 2023-02-04 ENCOUNTER — Encounter: Payer: Self-pay | Admitting: Internal Medicine

## 2023-02-04 ENCOUNTER — Ambulatory Visit: Payer: BC Managed Care – PPO | Attending: Internal Medicine | Admitting: Internal Medicine

## 2023-02-04 VITALS — BP 110/76 | HR 69 | Ht 73.0 in | Wt 341.6 lb

## 2023-02-04 DIAGNOSIS — I428 Other cardiomyopathies: Secondary | ICD-10-CM | POA: Diagnosis not present

## 2023-02-04 DIAGNOSIS — Z79899 Other long term (current) drug therapy: Secondary | ICD-10-CM

## 2023-02-04 DIAGNOSIS — I5020 Unspecified systolic (congestive) heart failure: Secondary | ICD-10-CM | POA: Diagnosis not present

## 2023-02-04 NOTE — Progress Notes (Signed)
ELECTROPHYSIOLOGY CONSULT NOTE  Patient ID: Henry Chandler, MRN: [id number], DOB/AGE: [birth year] 42 y.o. Admit date: (Not on file) Date of Consult: 02/04/2023  Primary Physician: Henry Niece, PA Primary Cardiologist: DB/MS     Henry Chandler is a 42 y.o. male who is being seen today for the evaluation of ICD at the request of Dr MS.    HPI Henry Chandler is a 42 y.o. male referred for consideration of an ICD.  He presented 5/23 with hypertensive urgency complicated by dyspnea edema and cough.  Nondescript chest discomfort.  Evaluation is as below and got started on guideline directed therapy.  They are going to follow-up issues with heart failure and prior referrals here.  Of late, he is doing well.  He denies dyspnea and his activities at work or climbing stairs, no edema.  Has sleep apnea  DATE TEST EF   5/23 Echo   20-25 %   5/23 LHC  No obstructive CAD  8/23 cMRI 26%         Date Cr K Hgb  7/23 1.23 4.2 16.0           Treated OSA  Past Medical History:  Diagnosis Date   Back pain    Habitual alcohol use    HFrEF (heart failure with reduced ejection fraction) ([place]) 05/14/2022   Echocardiogram 04/2022: EF 20-25, RVSP 33.9, mild MR, effusion Cardiac catheterization 04/2022: no CAD    Hypertension    Morbid obesity ([place])    Pneumonia due to COVID-19 virus 05/2019   Snoring    Suspected sleep apnea    Tobacco abuse       Surgical History:  Past Surgical History:  Procedure Laterality Date   RIGHT/LEFT HEART CATH AND CORONARY ANGIOGRAPHY N/A 05/14/2022   Procedure: RIGHT/LEFT HEART CATH AND CORONARY ANGIOGRAPHY;  Surgeon: Belva Crome, MD;  Location: [place] [facility];  Service: Cardiovascular;  Laterality: N/A;     Home Meds: Current Meds  Medication Sig   acetaminophen (TYLENOL) 325 MG tablet Take 650 mg by mouth every 6 (six) hours as needed for mild pain.   atorvastatin (LIPITOR) 40 MG tablet Take 1 tablet (40 mg total) by  mouth daily at 6 PM.   carvedilol (COREG) 6.25 MG tablet TAKE 1 TABLET(6.25 MG) BY MOUTH TWICE DAILY   empagliflozin (JARDIANCE) 10 MG TABS tablet Take 1 tablet (10 mg total) by mouth daily.   furosemide (LASIX) 20 MG tablet Take 20 mg by mouth daily.   irbesartan (AVAPRO) 300 MG tablet Take 1 tablet (300 mg total) by mouth daily.   isosorbide-hydrALAZINE (BIDIL) 20-37.5 MG tablet TAKE 1 TABLET BY MOUTH THREE TIMES DAILY   spironolactone (ALDACTONE) 50 MG tablet Take 0.5 tablets (25 mg total) by mouth daily.   traMADol (ULTRAM) 50 MG tablet Take 50 mg by mouth as needed.    Allergies:  Allergies  Allergen Reactions   Lisinopril Anaphylaxis and Swelling    Swelling of tongue    Social History   Socioeconomic History   Marital status: Married    Spouse name: Henry Chandler   Number of children: 0   Years of education: GED   Highest education level: Not on file  Occupational History   Occupation: Chief Strategy Officer: [employer]  Tobacco Use   Smoking status: Former    Packs/day: 0.25    Years: 17.00    Total pack years: 4.25    Types: Cigarettes  Smokeless tobacco: Never   Tobacco comments:    less than .5 PPD  Vaping Use   Vaping Use: Never used  Substance and Sexual Activity   Alcohol use: Not Currently    Alcohol/week: 10.0 - 15.0 standard drinks of alcohol    Types: 10 - 15 Shots of liquor per week    Comment: 10 -15 shots per week   Drug use: Yes    Types: Marijuana    Comment: daily   Sexual activity: Yes    Birth control/protection: Pill  Other Topics Concern   Not on file  Social History Narrative   Regular exercise-yes   Caffeine Use-yes   Social Determinants of Health   Financial Resource Strain: Low Risk  (05/15/2022)   Overall Financial Resource Strain (CARDIA)    Difficulty of Paying Living Expenses: Not very hard  Food Insecurity: No Food Insecurity (05/15/2022)   Hunger Vital Sign    Worried About Running Out of Food in the Last Year: Never  true    Ran Out of Food in the Last Year: Never true  Transportation Needs: No Transportation Needs (05/15/2022)   PRAPARE - Hydrologist (Medical): No    Lack of Transportation (Non-Medical): No  Physical Activity: Not on file  Stress: Not on file  Social Connections: Not on file  Intimate Partner Violence: Not on file     Family History  Problem Relation Age of Onset   Hypertension Mother    Cancer Mother        pancreas   Hyperlipidemia Mother    Heart disease Mother        ? stent? unclear details   Hypertension Father    Hyperlipidemia Father    Heart disease Sister        Had PNA -> had to have heart transplant   Diabetes Neg Hx    Stroke Neg Hx      ROS:  Please see the history of present illness.      All other systems reviewed and negative.    Physical Exam:  Blood pressure 110/76, pulse 69, height 6' 1"$  (1.854 m), weight (!) 341 lb 9.6 oz (154.9 kg), SpO2 92 %. General: Well developed, well nourished male in no acute distress. Head: Normocephalic, atraumatic, sclera non-icteric, no xanthomas, nares are without discharge. EENT: normal  Lymph Nodes:  none Neck: Negative for carotid bruits. JVD not elevated. Back:without scoliosis kyphosis Lungs: Clear bilaterally to auscultation without wheezes, rales, or rhonchi. Breathing is unlabored. Heart: RRR with S1 S2. No murmur . No rubs, or gallops appreciated. Abdomen: Soft, non-tender, non-distended with normoactive bowel sounds. No hepatomegaly. No rebound/guarding. No obvious abdominal masses. Msk:  Strength and tone appear normal for age. Extremities: No clubbing or cyanosis. No + edema.  Distal pedal pulses are 2+ and equal bilaterally. Skin: Warm and Dry Neuro: Alert and oriented X 3. CN III-XII intact Grossly normal sensory and motor function . Psych:  Responds to questions appropriately with a normal affect.        EKG: Sinus at 69 Interval 17/10/41 Nonspecific T wave  changes   Assessment and Plan:  Nonischemic cardiomyopathy with unclear ejection fraction  Congestive heart failure-class II  Hypertension-well-controlled  Obesity  Angioedema on ACE inhibitor Patient has symptoms related to heart failure, this in the context of very well-controlled blood pressure.  In terms of restratification for an implantable defibrillator, we would need to know what his ejection fraction is.  Will  obtain this.   He apparently has a history of edema, this precludes the use of Entresto.  Will continue him on BiDil, irbesartan, carvedilol and spironolactone.  Will check his metabolic profile today.  I think encouraged him to think about following up with heart failure clinic given his young age and their expertise that they will be able to bring to his care over the next hopefully decades       Virl Axe

## 2023-02-04 NOTE — Patient Instructions (Addendum)
Medication Instructions:  Your physician recommends that you continue on your current medications as directed. Please refer to the Current Medication list given to you today.  *If you need a refill on your cardiac medications before your next appointment, please call your pharmacy*   Lab Work: BMET today  If you have labs (blood work) drawn today and your tests are completely normal, you will receive your results only by: [place] (if you have MyChart) OR A paper copy in the mail If you have any lab test that is abnormal or we need to change your treatment, we will call you to review the results.   Testing/Procedures: Your physician has requested that you have an echocardiogram. Echocardiography is a painless test that uses sound waves to create images of your heart. It provides your doctor with information about the size and shape of your heart and how well your heart's chambers and valves are working. This procedure takes approximately one hour. There are no restrictions for this procedure. Please do NOT wear cologne, perfume, aftershave, or lotions (deodorant is allowed). Please arrive 15 minutes prior to your appointment time.    Follow-Up: At [facility], you and your health needs are our priority.  As part of our continuing mission to provide you with exceptional heart care, we have created designated Provider Care Teams.  These Care Teams include your primary Cardiologist (physician) and Advanced Practice Providers (APPs -  Physician Assistants and Nurse Practitioners) who all work together to provide you with the care you need, when you need it.  We recommend signing up for the patient portal called "MyChart".  Sign up information is provided on this After Visit Summary.  MyChart is used to connect with patients for Virtual Visits (Telemedicine).  Patients are able to view lab/test results, encounter notes, upcoming appointments, etc.  Non-urgent messages can be sent  to your provider as well.   To learn more about what you can do with MyChart, go to [email or web address].    Your next appointment:   Follow up with Dr Caryl Comes as needed  Dr Marlou Porch in 3 months

## 2023-02-04 NOTE — Telephone Encounter (Signed)
Per Dr Caryl Comes, nina this guy apparently had a sleep study but has not heard about his CPAP. The belong to you guys? Can you follow-up?   Reached out to [facility] talked to intake Henry Chandler) and they were missing the prescription so the order has just been sitting but I was not notified about it. Henry Chandler pulls everything out of  epic for [facility] so I'm sure it was an overlook. Rx faxed to [phone].

## 2023-02-05 LAB — BASIC METABOLIC PANEL
BUN/Creatinine Ratio: 11 (ref 9–20)
BUN: 17 mg/dL (ref 6–24)
CO2: 21 mmol/L (ref 20–29)
Calcium: 9.8 mg/dL (ref 8.7–10.2)
Chloride: 104 mmol/L (ref 96–106)
Creatinine, Ser: 1.52 mg/dL — ABNORMAL HIGH (ref 0.76–1.27)
Glucose: 86 mg/dL (ref 70–99)
Potassium: 4.3 mmol/L (ref 3.5–5.2)
Sodium: 140 mmol/L (ref 134–144)
eGFR: 59 mL/min/{1.73_m2} — ABNORMAL LOW (ref >59)

## 2023-03-06 ENCOUNTER — Other Ambulatory Visit (HOSPITAL_COMMUNITY): Payer: Self-pay | Admitting: Physician Assistant

## 2023-03-07 ENCOUNTER — Telehealth (HOSPITAL_COMMUNITY): Payer: Self-pay | Admitting: Cardiology

## 2023-03-07 ENCOUNTER — Ambulatory Visit (HOSPITAL_COMMUNITY): Payer: BC Managed Care – PPO | Attending: Internal Medicine

## 2023-04-11 ENCOUNTER — Ambulatory Visit (HOSPITAL_COMMUNITY)
Admission: RE | Admit: 2023-04-11 | Discharge: 2023-04-11 | Disposition: A | Discharge status: Home / Self Care | Payer: BC Managed Care – PPO | Source: Ambulatory Visit | Attending: Cardiology | Admitting: Cardiology

## 2023-04-11 ENCOUNTER — Ambulatory Visit (HOSPITAL_BASED_OUTPATIENT_CLINIC_OR_DEPARTMENT_OTHER): Payer: BC Managed Care – PPO

## 2023-04-11 VITALS — BP 140/100 | HR 86 | Wt 341.0 lb

## 2023-04-11 DIAGNOSIS — I428 Other cardiomyopathies: Secondary | ICD-10-CM | POA: Diagnosis not present

## 2023-04-11 DIAGNOSIS — I110 Hypertensive heart disease with heart failure: Secondary | ICD-10-CM | POA: Insufficient documentation

## 2023-04-11 DIAGNOSIS — I5022 Chronic systolic (congestive) heart failure: Secondary | ICD-10-CM | POA: Insufficient documentation

## 2023-04-11 DIAGNOSIS — Z7984 Long term (current) use of oral hypoglycemic drugs: Secondary | ICD-10-CM | POA: Insufficient documentation

## 2023-04-11 DIAGNOSIS — Z823 Family history of stroke: Secondary | ICD-10-CM | POA: Insufficient documentation

## 2023-04-11 DIAGNOSIS — Z79899 Other long term (current) drug therapy: Secondary | ICD-10-CM

## 2023-04-11 DIAGNOSIS — Z8249 Family history of ischemic heart disease and other diseases of the circulatory system: Secondary | ICD-10-CM | POA: Diagnosis not present

## 2023-04-11 DIAGNOSIS — I10 Essential (primary) hypertension: Secondary | ICD-10-CM

## 2023-04-11 DIAGNOSIS — Z6841 Body Mass Index (BMI) 40.0 and over, adult: Secondary | ICD-10-CM | POA: Diagnosis not present

## 2023-04-11 DIAGNOSIS — I5020 Unspecified systolic (congestive) heart failure: Secondary | ICD-10-CM

## 2023-04-11 DIAGNOSIS — Z8616 Personal history of COVID-19: Secondary | ICD-10-CM | POA: Insufficient documentation

## 2023-04-11 DIAGNOSIS — E785 Hyperlipidemia, unspecified: Secondary | ICD-10-CM | POA: Diagnosis not present

## 2023-04-11 DIAGNOSIS — Z87891 Personal history of nicotine dependence: Secondary | ICD-10-CM | POA: Insufficient documentation

## 2023-04-11 DIAGNOSIS — Z888 Allergy status to other drugs, medicaments and biological substances status: Secondary | ICD-10-CM | POA: Insufficient documentation

## 2023-04-11 LAB — ECHOCARDIOGRAM COMPLETE
Area-P 1/2: 2.43 cm2
S' Lateral: 5.3 cm
Weight: 5456 oz

## 2023-04-11 LAB — BASIC METABOLIC PANEL
Anion gap: 8 (ref 5–15)
BUN: 14 mg/dL (ref 6–20)
CO2: 22 mmol/L (ref 22–32)
Calcium: 9 mg/dL (ref 8.9–10.3)
Chloride: 105 mmol/L (ref 98–111)
Creatinine, Ser: 1.13 mg/dL (ref 0.61–1.24)
GFR, Estimated: >60 mL/min (ref >60)
Glucose, Bld: 105 mg/dL — ABNORMAL HIGH (ref 70–99)
Potassium: 4.2 mmol/L (ref 3.5–5.1)
Sodium: 135 mmol/L (ref 135–145)

## 2023-04-11 LAB — BRAIN NATRIURETIC PEPTIDE: B Natriuretic Peptide: 36.6 pg/mL (ref 0.0–100.0)

## 2023-04-11 MED ORDER — ISOSORB DINITRATE-HYDRALAZINE 20-37.5 MG PO TABS: 1.5000 | ORAL_TABLET | Freq: Three times a day (TID) | ORAL | 3 refills | Status: DC

## 2023-04-11 MED ORDER — EMPAGLIFLOZIN 10 MG PO TABS: 10.0000 mg | ORAL_TABLET | Freq: Every day | ORAL | 11 refills | Status: DC

## 2023-04-11 MED ORDER — CARVEDILOL 12.5 MG PO TABS: 12.5000 mg | ORAL_TABLET | Freq: Two times a day (BID) | ORAL | 3 refills | Status: DC

## 2023-04-11 NOTE — Patient Instructions (Signed)
INCREASE Carvedilol to 12.5 mg Twice daily  INCREASE Bidil to 1.5 mg Three times a day  Labs done today, your results will be available in MyChart, we will contact you for abnormal readings.  Your physician has requested that you have an echocardiogram. Echocardiography is a painless test that uses sound waves to create images of your heart. It provides your doctor with information about the size and shape of your heart and how well your heart's chambers and valves are working. This procedure takes approximately one hour. There are no restrictions for this procedure. Please do NOT wear cologne, perfume, aftershave, or lotions (deodorant is allowed). Please arrive 15 minutes prior to your appointment time.  Your physician recommends that you schedule a follow-up appointment in: 1 month with an echocardiogram   If you have any questions or concerns before your next appointment please send [place] a message through [place] or call our office at [phone].    TO LEAVE A MESSAGE FOR THE NURSE SELECT OPTION 2, PLEASE LEAVE A MESSAGE INCLUDING: YOUR NAME DATE OF BIRTH CALL BACK NUMBER REASON FOR CALL**this is important as we prioritize the call backs  YOU WILL RECEIVE A CALL BACK THE SAME DAY AS LONG AS YOU CALL BEFORE 4:00 PM  At the [facility], you and your health needs are our priority. As part of our continuing mission to provide you with exceptional heart care, we have created designated Provider Care Teams. These Care Teams include your primary Cardiologist (physician) and Advanced Practice Providers (APPs- Physician Assistants and Nurse Practitioners) who all work together to provide you with the care you need, when you need it.   You may see any of the following providers on your designated Care Team at your next follow up: Dr Arvilla Meres Dr Marca Ancona Dr. Marcos Eke, NP Robbie Lis, [place] [facility] [place], [place] Brynda Peon,  NP Karle Plumber, PharmD   Please be sure to bring in all your medications bottles to every appointment.    Thank you for choosing [facility]

## 2023-04-11 NOTE — Progress Notes (Signed)
ADVANCED HEART FAILURE CLINIC NOTE  Referring Physician: Maud Deed, PA  Primary Care: Maud Deed, [place] Primary Cardiologist:  HPI: Henry Chandler is a 42 y.o. male with a history of uncontrolled hypertension, heart failure with severely reduced EF, hyperlipidemia, history of COVID-pneumonia complicated by respiratory failure, morbid obesity presenting today to establish care.  According to Mr. Rockefeller, he has had uncontrolled HTN since 21-18 years old. He has been on anti-hypertensives since that time. He was on and off medications during this time. In 2023, he was off his medications for some time. During a PCP visit, he started having chest pain & SOB; he admitted to [facility] for hypertensive urgency. He subsequently had an echocardiogram in May 2023 with LVEF of 25-30%. He was started on GDMT and reports much improvement in his overall functional status and blood pressure control.  Since that time he has also seen Dr. Graciela Husbands who has continued to uptitrate his GDMT.   Activity level/exercise tolerance:  NYHA II Orthopnea:  Sleeps on 2 pillows Paroxysmal noctural dyspnea:  NO Chest pain/pressure:  NO Orthostatic lightheadedness:  NO Palpitations:  NO Lower extremity edema:  NO Presyncope/syncope:  NO Cough:  NO  Past Medical History:  Diagnosis Date   Back pain    Habitual alcohol use    HFrEF (heart failure with reduced ejection fraction) (HCC) 05/14/2022   Echocardiogram 04/2022: EF 20-25, RVSP 33.9, mild MR, effusion Cardiac catheterization 04/2022: no CAD    Hypertension    Morbid obesity (HCC)    Pneumonia due to COVID-19 virus 05/2019   Snoring    Suspected sleep apnea    Tobacco abuse     Current Outpatient Medications  Medication Sig Dispense Refill   acetaminophen (TYLENOL) 325 MG tablet Take 650 mg by mouth every 6 (six) hours as needed for mild pain.     atorvastatin (LIPITOR) 40 MG tablet Take 1 tablet (40 mg total) by mouth daily at 6 PM. 30 tablet  1   furosemide (LASIX) 20 MG tablet Take 20 mg by mouth daily.     irbesartan (AVAPRO) 300 MG tablet Take 1 tablet (300 mg total) by mouth daily. 30 tablet 1   spironolactone (ALDACTONE) 50 MG tablet Take 0.5 tablets (25 mg total) by mouth daily. 45 tablet 3   traMADol (ULTRAM) 50 MG tablet Take 50 mg by mouth as needed.     carvedilol (COREG) 12.5 MG tablet Take 1 tablet (12.5 mg total) by mouth 2 (two) times daily with a meal. 180 tablet 3   empagliflozin (JARDIANCE) 10 MG TABS tablet Take 1 tablet (10 mg total) by mouth daily. 30 tablet 11   isosorbide-hydrALAZINE (BIDIL) 20-37.5 MG tablet Take 1.5 tablets by mouth 3 (three) times daily. 180 tablet 3   No current facility-administered medications for this encounter.    Allergies  Allergen Reactions   Lisinopril Anaphylaxis and Swelling    Swelling of tongue      Social History   Socioeconomic History   Marital status: Married    Spouse name: Lameco   Number of children: 0   Years of education: GED   Highest education level: Not on file  Occupational History   Occupation: Occupational psychologist: [employer]  Tobacco Use   Smoking status: Former    Packs/day: 0.25    Years: 17.00    Additional pack years: 0.00    Total pack years: 4.25    Types: Cigarettes   Smokeless tobacco: Never  Tobacco comments:    less than .5 PPD  Vaping Use   Vaping Use: Never used  Substance and Sexual Activity   Alcohol use: Not Currently    Alcohol/week: 10.0 - 15.0 standard drinks of alcohol    Types: 10 - 15 Shots of liquor per week    Comment: 10 -15 shots per week   Drug use: Yes    Types: Marijuana    Comment: daily   Sexual activity: Yes    Birth control/protection: Pill  Other Topics Concern   Not on file  Social History Narrative   Regular exercise-yes   Caffeine Use-yes   Social Determinants of Health   Financial Resource Strain: Low Risk  (05/15/2022)   Overall Financial Resource Strain (CARDIA)    Difficulty  of Paying Living Expenses: Not very hard  Food Insecurity: No Food Insecurity (05/15/2022)   Hunger Vital Sign    Worried About Running Out of Food in the Last Year: Never true    Ran Out of Food in the Last Year: Never true  Transportation Needs: No Transportation Needs (05/15/2022)   PRAPARE - Administrator, Civil Service (Medical): No    Lack of Transportation (Non-Medical): No  Physical Activity: Not on file  Stress: Not on file  Social Connections: Not on file  Intimate Partner Violence: Not on file      Family History  Problem Relation Age of Onset   Hypertension Mother    Cancer Mother        pancreas   Hyperlipidemia Mother    Heart disease Mother        ? stent? unclear details   Hypertension Father    Hyperlipidemia Father    Heart disease Sister        Had PNA -> had to have heart transplant   Diabetes Neg Hx    Stroke Neg Hx     PHYSICAL EXAM: Vitals:   04/11/23 0945  BP: (!) 140/100  Pulse: 86  SpO2: 94%   GENERAL: Well nourished, well developed, and in no apparent distress at rest.  HEENT: Negative for arcus senilis or xanthelasma. There is no scleral icterus.  The mucous membranes are pink and moist.   NECK: Supple, No masses. Normal carotid upstrokes without bruits. No masses or thyromegaly.    CHEST: There are no chest wall deformities. There is no chest wall tenderness. Respirations are unlabored.  Lungs- cta b/l CARDIAC:  JVP: 7 cm H2O         Normal S1, S2  Normal rate with regular rhythm. No murmurs, rubs or gallops.  Pulses are 2+ and symmetrical in upper and lower extremities. NO edema.  ABDOMEN: Soft, non-tender, non-distended. There are no masses or hepatomegaly. There are normal bowel sounds.  EXTREMITIES: Warm and well perfused with no cyanosis, clubbing.  LYMPHATIC: No axillary or supraclavicular lymphadenopathy.  NEUROLOGIC: Patient is oriented x3 with no focal or lateralizing neurologic deficits.  PSYCH: Patients affect is  appropriate, there is no evidence of anxiety or depression.  SKIN: Warm and dry; no lesions or wounds.   DATA REVIEW  ECG: 04/11/23: NSR  As per my personal interpretation  ECHO: 04/11/23: LVEF 30-35%, normal RV function as per my personal interpretation 05/10/22: LVEF 20-25%, normal RV function.   CATH: 05/14/22: Chronic systolic heart failure with elevated LVEDP of 28 mmHg., Nonischemic cardiomyopathy with compensated volume status given pulmonary wedge mean pressure of 13 mmHg. Right dominant normal coronary arteries. Normal pulmonary artery  pressure.   CMR: 07/2022: 1. Severe LVE with global hypokinesis EF 26%  2. 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  No significant delayed gadolinium uptake  4.  Normal parametric measures see above  5.  Mild LAE  6.  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  Will plan on genetic testing for further assessment. NYHA class / AHA Stage: NYHA II Volume status & Diuretics: Euvolemic, continue Lasix 20 mg daily Vasodilators:Increase Bidil 1.5 TID.  Will discuss transition to [facility] with allergy due to history of angioedema.  Currently taking irbesartan without difficulty. Beta-Blocker:coreg 6.25mg  BID, increase to 12.5mg  BID MRA: Continue spironolactone Cardiometabolic:jardiance 10mg  daily Devices therapies & Valvulopathies: Not currently indicated, improvement in EF to 30 to 35%.  Will continue to uptitrate GDMT and reevaluate. Advanced therapies: Not currently indicated  2. Lisinopril angioedema  - Reports 5-6 years ago having respiratory distress, throat swelling that required steroids for improvement.    3.  Uncontrolled hypertension -CT abdomen from August 2019 does not demonstrate any significant renal artery stenosis or calcification as per my read. -Improving blood pressure control.  Will continue to uptitrate BiDil today and carvedilol.  Continue spironolactone 25 mg daily.  Plan for renal artery duplex although CT scan as noted above does not demonstrate RAS.  4.  Obesity -Body mass index is 44.99 kg/m. -We discussed the importance of weight loss.  If he were to require transplant in the future I explained the ordinance of continued weight loss.  Will refer to cardiac rehab and healthy weight loss clinic.  Allyn Bertoni Advanced Heart Failure Mechanical Circulatory Support

## 2023-04-18 ENCOUNTER — Telehealth (HOSPITAL_COMMUNITY): Payer: Self-pay

## 2023-04-18 ENCOUNTER — Telehealth: Payer: Self-pay

## 2023-04-18 NOTE — Telephone Encounter (Signed)
-----   Message from Duke Salvia, MD sent at 04/12/2023 10:55 AM EDT -----  Please Inform Patient Echo showed  stable  heart muscle function still < 35 %   we can proceed with ICD if that is the way he would like to go      Thanks

## 2023-04-18 NOTE — Telephone Encounter (Signed)
Spoke with pt and advised of echo results per Dr Graciela Husbands.  Pt verbalizes understanding and states he will call and let us know if and when he decides to proceed with ICD implantation.

## 2023-04-18 NOTE — Telephone Encounter (Signed)
Called and spoke with pt in regards to CR, pt stated he is not interested at this time due to his work schedule.  Closed referral 

## 2023-05-16 ENCOUNTER — Ambulatory Visit: Payer: BC Managed Care – PPO | Attending: Cardiology | Admitting: Cardiology

## 2023-05-16 ENCOUNTER — Encounter: Payer: Self-pay | Admitting: Cardiology

## 2023-05-16 VITALS — BP 124/82 | HR 79 | Ht 73.0 in | Wt 349.4 lb

## 2023-05-16 DIAGNOSIS — I5020 Unspecified systolic (congestive) heart failure: Secondary | ICD-10-CM

## 2023-05-16 DIAGNOSIS — I10 Essential (primary) hypertension: Secondary | ICD-10-CM

## 2023-05-16 NOTE — Patient Instructions (Signed)
Medication Instructions:  The current medical regimen is effective;  continue present plan and medications.  *If you need a refill on your cardiac medications before your next appointment, please call your pharmacy*  Follow-Up: At [facility], you and your health needs are our priority.  As part of our continuing mission to provide you with exceptional heart care, we have created designated Provider Care Teams.  These Care Teams include your primary Cardiologist (physician) and Advanced Practice Providers (APPs -  Physician Assistants and Nurse Practitioners) who all work together to provide you with the care you need, when you need it.  We recommend signing up for the patient portal called "MyChart".  Sign up information is provided on this After Visit Summary.  MyChart is used to connect with patients for Virtual Visits (Telemedicine).  Patients are able to view lab/test results, encounter notes, upcoming appointments, etc.  Non-urgent messages can be sent to your provider as well.   To learn more about what you can do with MyChart, go to [email or web address].    Your next appointment:   6 month(s)  Provider:   Mark Skains, MD     

## 2023-05-16 NOTE — Progress Notes (Signed)
Cardiology Office Note:    Date:  05/16/2023   ID:  Loistine Simas, DOB [birth year], MRN [id number]  PCP:  Maud Deed, PA   [facility] Providers Cardiologist:  Donato Schultz, MD     Referring MD: Maud Deed, [place]    History of Present Illness:    Henry Chandler is a 42 y.o. male here for the follow-up of ejection fraction 25 to 30% in May 2023.  Much improved functional status with blood pressure control.  Taking goal-directed medical therapy.  No chest pain no orthopnea no palpitations no cough.  Doing quite well.  No current limitations.  He did have sleep study, waiting for CPAP.  Past Medical History:  Diagnosis Date   Back pain    Habitual alcohol use    HFrEF (heart failure with reduced ejection fraction) (HCC) 05/14/2022   Echocardiogram 04/2022: EF 20-25, RVSP 33.9, mild MR, effusion Cardiac catheterization 04/2022: no CAD    Hypertension    Morbid obesity (HCC)    Pneumonia due to COVID-19 virus 05/2019   Snoring    Suspected sleep apnea    Tobacco abuse     Past Surgical History:  Procedure Laterality Date   RIGHT/LEFT HEART CATH AND CORONARY ANGIOGRAPHY N/A 05/14/2022   Procedure: RIGHT/LEFT HEART CATH AND CORONARY ANGIOGRAPHY;  Surgeon: Lyn Records, MD;  Location: [facility] INVASIVE CV LAB;  Service: Cardiovascular;  Laterality: N/A;    Current Medications: Current Meds  Medication Sig   acetaminophen (TYLENOL) 325 MG tablet Take 650 mg by mouth every 6 (six) hours as needed for mild pain.   atorvastatin (LIPITOR) 40 MG tablet Take 1 tablet (40 mg total) by mouth daily at 6 PM.   carvedilol (COREG) 12.5 MG tablet Take 1 tablet (12.5 mg total) by mouth 2 (two) times daily with a meal.   empagliflozin (JARDIANCE) 10 MG TABS tablet Take 1 tablet (10 mg total) by mouth daily.   furosemide (LASIX) 20 MG tablet Take 20 mg by mouth daily.   irbesartan (AVAPRO) 300 MG tablet Take 1 tablet (300 mg total) by mouth daily.   isosorbide-hydrALAZINE  (BIDIL) 20-37.5 MG tablet Take 1.5 tablets by mouth 3 (three) times daily.   spironolactone (ALDACTONE) 50 MG tablet Take 0.5 tablets (25 mg total) by mouth daily.   traMADol (ULTRAM) 50 MG tablet Take 50 mg by mouth as needed.     Allergies:   Lisinopril   Social History   Socioeconomic History   Marital status: Married    Spouse name: Lameco   Number of children: 0   Years of education: GED   Highest education level: Not on file  Occupational History   Occupation: Occupational psychologist: [employer]  Tobacco Use   Smoking status: Former    Packs/day: 0.25    Years: 17.00    Additional pack years: 0.00    Total pack years: 4.25    Types: Cigarettes   Smokeless tobacco: Never   Tobacco comments:    less than .5 PPD  Vaping Use   Vaping Use: Never used  Substance and Sexual Activity   Alcohol use: Not Currently    Alcohol/week: 10.0 - 15.0 standard drinks of alcohol    Types: 10 - 15 Shots of liquor per week    Comment: 10 -15 shots per week   Drug use: Yes    Types: Marijuana    Comment: daily   Sexual activity: Yes    Birth control/protection:  Pill  Other Topics Concern   Not on file  Social History Narrative   Regular exercise-yes   Caffeine Use-yes   Social Determinants of Health   Financial Resource Strain: Low Risk  (05/15/2022)   Overall Financial Resource Strain (CARDIA)    Difficulty of Paying Living Expenses: Not very hard  Food Insecurity: No Food Insecurity (05/15/2022)   Hunger Vital Sign    Worried About Running Out of Food in the Last Year: Never true    Ran Out of Food in the Last Year: Never true  Transportation Needs: No Transportation Needs (05/15/2022)   PRAPARE - Administrator, Civil Service (Medical): No    Lack of Transportation (Non-Medical): No  Physical Activity: Not on file  Stress: Not on file  Social Connections: Not on file     Family History: The patient's family history includes Cancer in his mother; Heart  disease in his mother and sister; Hyperlipidemia in his father and mother; Hypertension in his father and mother. There is no history of Diabetes or Stroke.  ROS:   Please see the history of present illness.     All other systems reviewed and are negative.  EKGs/Labs/Other Studies Reviewed:    The following studies were reviewed today:   ECHO: 04/11/23: LVEF 30-35%, normal RV function as per my personal interpretation 05/10/22: LVEF 20-25%, normal RV function.    CATH: 05/14/22: Chronic systolic heart failure with elevated LVEDP of 28 mmHg., Nonischemic cardiomyopathy with compensated volume status given pulmonary wedge mean pressure of 13 mmHg. Right dominant normal coronary arteries. Normal pulmonary artery pressure.    CMR: 07/2022: 1. Severe LVE with global hypokinesis EF 26%  2. 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  No significant delayed gadolinium uptake  4.  Normal parametric measures see above  5.  Mild LAE  6.  Trivial appearing MR  EKG:  No new  Recent Labs: 04/11/2023: B Natriuretic Peptide 36.6; BUN 14; Creatinine, Ser 1.13; Potassium 4.2; Sodium 135  Recent Lipid Panel    Component Value Date/Time   CHOL 130 05/11/2022 0309   TRIG 102 05/11/2022 0309   HDL 31 (L) 05/11/2022 0309   CHOLHDL 4.2 05/11/2022 0309   VLDL 20 05/11/2022 0309   LDLCALC 79 05/11/2022 0309     Risk Assessment/Calculations:               Physical Exam:    VS:  BP 124/82   Pulse 79   Ht 6\' 1"  (1.854 m)   Wt (!) 349 lb 6.4 oz (158.5 kg)   SpO2 95%   BMI 46.10 kg/m     Wt Readings from Last 3 Encounters:  05/16/23 (!) 349 lb 6.4 oz (158.5 kg)  04/11/23 (!) 341 lb (154.7 kg)  02/04/23 (!) 341 lb 9.6 oz (154.9 kg)     GEN:  Well nourished, well developed in no acute distress HEENT: Normal NECK: No JVD; No carotid bruits LYMPHATICS: No lymphadenopathy CARDIAC: RRR, no murmurs, rubs,  gallops RESPIRATORY:  Clear to auscultation without rales, wheezing or rhonchi  ABDOMEN: Soft, non-tender, non-distended MUSCULOSKELETAL:  No edema; No deformity  SKIN: Warm and dry NEUROLOGIC:  Alert and oriented x 3 PSYCHIATRIC:  Normal affect   ASSESSMENT:    1. HFrEF (heart failure with reduced ejection fraction) (HCC)   2. 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  Doing quite well.  Works in a Environmental manager.  Not feeling short of breath.  No orthopnea.  No chest pain.  Hypertension - Under much better control.  Multidrug regimen.  No renal artery stenosis on CT scan 2019.  Continue with goal-directed medical therapy as above.  He is not on Entresto because of prior angioedema.  Has upcoming echocardiogram appointment and follow-up with advanced heart failure clinic.  Appreciate their assistance.  We will inquire about CPAP for him.          Medication Adjustments/Labs and Tests Ordered: Current medicines are reviewed at length with the patient today.  Concerns regarding medicines are outlined above.  No orders of the defined types were placed in this encounter.  No orders of the defined types were placed in this encounter.   Patient Instructions  Medication Instructions:  The current medical regimen is effective;  continue present plan and medications.  *If you need a refill on your cardiac medications before your next appointment, please call your pharmacy*  Follow-Up: At [facility], you and your health needs are our priority.  As part of our continuing mission to provide you with exceptional heart care, we have created designated Provider Care Teams.  These Care Teams include your primary Cardiologist (physician) and Advanced Practice Providers (APPs -  Physician Assistants and  Nurse Practitioners) who all work together to provide you with the care you need, when you need it.  We recommend signing up for the patient portal called "MyChart".  Sign up information is provided on this After Visit Summary.  MyChart is used to connect with patients for Virtual Visits (Telemedicine).  Patients are able to view lab/test results, encounter notes, upcoming appointments, etc.  Non-urgent messages can be sent to your provider as well.   To learn more about what you can do with MyChart, go to [email or web address].    Your next appointment:   6 month(s)  Provider:   Donato Schultz, MD        Signed, Donato Schultz, MD  05/16/2023 12:34 PM    [facility]

## 2023-05-23 ENCOUNTER — Encounter (HOSPITAL_COMMUNITY): Payer: BC Managed Care – PPO | Admitting: Cardiology

## 2023-05-23 ENCOUNTER — Ambulatory Visit (HOSPITAL_COMMUNITY): Payer: BC Managed Care – PPO

## 2023-06-09 ENCOUNTER — Other Ambulatory Visit: Payer: Self-pay | Admitting: Physician Assistant

## 2023-06-10 NOTE — Telephone Encounter (Signed)
Rx(s) sent to pharmacy electronically.  

## 2023-07-03 NOTE — Progress Notes (Signed)
ADVANCED HEART FAILURE CLINIC NOTE  Referring Physician: Maud Deed, PA  Primary Care: Maud Deed, [place] Primary Cardiologist: Dr. Anne Fu HF: Dr. Gasper Lloyd  HPI: Henry Chandler is a 42 y.o. male with a history of uncontrolled hypertension, heart failure with severely reduced EF, hyperlipidemia, history of COVID-pneumonia complicated by respiratory failure, morbid obesity presenting today for follow up.   According to Mr. Lipsett, he has had uncontrolled HTN since [age] years old. He has been on anti-hypertensives since that time. He was on and off medications during this time. In 2023, he was off his medications for some time. During a PCP visit, he started having chest pain & SOB; he admitted to [facility] for hypertensive urgency. He subsequently had an echocardiogram in May 2023 with LVEF of 25-30%. He was started on GDMT and reports much improvement in his overall functional status and blood pressure control.  Since that time he has also seen Dr. Graciela Husbands who has continued to uptitrate his GDMT.  Interval hx: - NYHA II symptoms; working in a warehouse currently. Goes to the gym to lift weights everyday after work. Minimal fatigue or functional limitations.   Activity level/exercise tolerance:  NYHA II Orthopnea:  Sleeps on 2 pillows Paroxysmal noctural dyspnea:  NO Chest pain/pressure:  NO Orthostatic lightheadedness:  NO Palpitations:  NO Lower extremity edema:  NO Presyncope/syncope:  NO Cough:  NO  Past Medical History:  Diagnosis Date   Back pain    Habitual alcohol use    HFrEF (heart failure with reduced ejection fraction) (HCC) 05/14/2022   Echocardiogram 04/2022: EF 20-25, RVSP 33.9, mild MR, effusion Cardiac catheterization 04/2022: no CAD    Hypertension    Morbid obesity (HCC)    Pneumonia due to COVID-19 virus 05/2019   Snoring    Suspected sleep apnea    Tobacco abuse     Current Outpatient Medications  Medication Sig Dispense Refill   acetaminophen  (TYLENOL) 325 MG tablet Take 650 mg by mouth every 6 (six) hours as needed for mild pain.     atorvastatin (LIPITOR) 40 MG tablet Take 1 tablet (40 mg total) by mouth daily at 6 PM. 30 tablet 1   empagliflozin (JARDIANCE) 10 MG TABS tablet Take 1 tablet (10 mg total) by mouth daily. 30 tablet 11   furosemide (LASIX) 20 MG tablet TAKE 1 TABLET(20 MG) BY MOUTH DAILY 90 tablet 1   irbesartan (AVAPRO) 300 MG tablet Take 1 tablet (300 mg total) by mouth daily. 30 tablet 1   isosorbide-hydrALAZINE (BIDIL) 20-37.5 MG tablet Take 1.5 tablets by mouth 3 (three) times daily. 180 tablet 3   spironolactone (ALDACTONE) 50 MG tablet Take 0.5 tablets (25 mg total) by mouth daily. 45 tablet 3   carvedilol (COREG) 25 MG tablet Take 1 tablet (25 mg total) by mouth 2 (two) times daily with a meal. 60 tablet 11   No current facility-administered medications for this encounter.    Allergies  Allergen Reactions   Lisinopril Anaphylaxis and Swelling    Swelling of tongue      Social History   Socioeconomic History   Marital status: Married    Spouse name: Lameco   Number of children: 0   Years of education: GED   Highest education level: Not on file  Occupational History   Occupation: Occupational psychologist: [employer]  Tobacco Use   Smoking status: Former    Current packs/day: 0.25    Average packs/day: 0.3 packs/day for 17.0  years (4.3 ttl pk-yrs)    Types: Cigarettes   Smokeless tobacco: Never   Tobacco comments:    less than .5 PPD  Vaping Use   Vaping status: Never Used  Substance and Sexual Activity   Alcohol use: Not Currently    Alcohol/week: 10.0 - 15.0 standard drinks of alcohol    Types: 10 - 15 Shots of liquor per week    Comment: 10 -15 shots per week   Drug use: Yes    Types: Marijuana    Comment: daily   Sexual activity: Yes    Birth control/protection: Pill  Other Topics Concern   Not on file  Social History Narrative   Regular exercise-yes   Caffeine Use-yes    Social Determinants of Health   Financial Resource Strain: Low Risk  (01/17/2023)   Received from [facility]   Overall Financial Resource Strain (CARDIA)    Difficulty of Paying Living Expenses: Not hard at all  Food Insecurity: No Food Insecurity (01/17/2023)   Received [facility]   Hunger Vital Sign    Worried About Running Out of Food in the Last Year: Never true    Ran Out of Food in the Last Year: Never true  Transportation Needs: No Transportation Needs (01/17/2023)   Received from [facility] - Transportation    Lack of Transportation (Medical): No    Lack of Transportation (Non-Medical): No  Physical Activity: Insufficiently Active (05/10/2022)   Received from [facility]   Exercise Vital Sign    Days of Exercise per Week: 4 days    Minutes of Exercise per Session: 10 min  Stress: No Stress Concern Present (05/10/2022)   Received from [facility] of Occupational [facility] - Occupational Stress Questionnaire    Feeling of Stress : Only a little  Social Connections: Unknown (05/24/2023)   Received from [facility]   Social Network    Social Network: Not on file  Intimate Partner Violence: Unknown (05/24/2023)   Received from [facility]   HITS    Physically Hurt: Not on file    Insult or Talk Down To: Not on file    Threaten Physical Harm: Not on file    Scream or Curse: Not on file      Family History  Problem Relation Age of Onset   Hypertension Mother    Cancer Mother        pancreas   Hyperlipidemia Mother    Heart disease Mother        ? stent? unclear details   Hypertension Father    Hyperlipidemia Father    Heart disease Sister        Had PNA -> had to have heart transplant   Diabetes Neg Hx    Stroke Neg Hx     PHYSICAL EXAM: Vitals:   07/04/23 0959  BP: 120/82  Pulse: 87  SpO2: 95%   GENERAL: Well nourished, well developed, and in no apparent distress at rest.  HEENT: Negative for arcus senilis or  xanthelasma. There is no scleral icterus.  The mucous membranes are pink and moist.   NECK: Supple, No masses. Normal carotid upstrokes without bruits. No masses or thyromegaly.    CHEST: There are no chest wall deformities. There is no chest wall tenderness. Respirations are unlabored.  Lungs- CTA B/L CARDIAC:  JVP: 7 cm          Normal rate with regular rhythm. No murmurs, rubs or  gallops.  Pulses are 2+ and symmetrical in upper and lower extremities. No edema.  ABDOMEN: Soft, non-tender, non-distended. There are no masses or hepatomegaly. There are normal bowel sounds.  EXTREMITIES: Warm and well perfused with no cyanosis, clubbing.  LYMPHATIC: No axillary or supraclavicular lymphadenopathy.  NEUROLOGIC: Patient is oriented x3 with no focal or lateralizing neurologic deficits.  PSYCH: Patients affect is appropriate, there is no evidence of anxiety or depression.  SKIN: Warm and dry; no lesions or wounds.    DATA REVIEW  ECG: 04/11/23: NSR  As per my personal interpretation  ECHO: 04/11/23: LVEF 30-35%, normal RV function as per my personal interpretation 05/10/22: LVEF 20-25%, normal RV function.   CATH: 05/14/22: Chronic systolic heart failure with elevated LVEDP of 28 mmHg., Nonischemic cardiomyopathy with compensated volume status given pulmonary wedge mean pressure of 13 mmHg. Right dominant normal coronary arteries. Normal pulmonary artery pressure.   CMR: 07/2022: 1. Severe LVE with global hypokinesis EF 26%  2. 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  No significant delayed gadolinium uptake  4.  Normal parametric measures see above  5.  Mild LAE  6.  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  His sister required cardiac transplant a few years ago. His parents also have heart disease however he does not know the details. Will start genetic testing today.  NYHA class / AHA Stage: NYHA II Volume status & Diuretics: Euvolemic, continue Lasix 20 mg daily Vasodilators:Continue Bidil 1.5 TID.  Will discuss transition to [facility] with allergy due to history of angioedema.  Currently taking irbesartan without difficulty. Beta-Blocker: Increase coreg to 25mg  BID. Repeat labs today MRA: Continue spironolactone Cardiometabolic:jardiance 10mg  daily Devices therapies & Valvulopathies: LVEF today 35%-40%. Will continue to monitor.  Advanced therapies: Not currently indicated  2. Lisinopril angioedema  - Reports 5-6 years ago having respiratory distress, throat swelling that required steroids for improvement.   3.  Uncontrolled hypertension -CT abdomen from August 2019 does not demonstrate any significant renal artery stenosis or calcification as per my read. -Improving blood pressure control.  Will continue to uptitrate BiDil today and carvedilol.  Continue spironolactone 25 mg daily.   - Repeat albs today.   4.  Obesity -Body mass index is 45.31 kg/m. -We discussed the importance of weight loss.  If he were to require transplant in the future I explained the ordinance of continued weight loss.  Henry Chandler Advanced Heart Failure Mechanical Circulatory Support

## 2023-07-04 ENCOUNTER — Ambulatory Visit (HOSPITAL_COMMUNITY)
Admission: RE | Admit: 2023-07-04 | Discharge: 2023-07-04 | Disposition: A | Discharge status: Home / Self Care | Payer: BC Managed Care – PPO | Source: Ambulatory Visit | Attending: Cardiology | Admitting: Cardiology

## 2023-07-04 ENCOUNTER — Ambulatory Visit (HOSPITAL_BASED_OUTPATIENT_CLINIC_OR_DEPARTMENT_OTHER)
Admission: RE | Admit: 2023-07-04 | Discharge: 2023-07-04 | Disposition: A | Discharge status: Home / Self Care | Payer: BC Managed Care – PPO | Source: Ambulatory Visit | Attending: Cardiology | Admitting: Cardiology

## 2023-07-04 VITALS — BP 120/82 | HR 87 | Wt 343.4 lb

## 2023-07-04 DIAGNOSIS — Z8249 Family history of ischemic heart disease and other diseases of the circulatory system: Secondary | ICD-10-CM | POA: Diagnosis not present

## 2023-07-04 DIAGNOSIS — I10 Essential (primary) hypertension: Secondary | ICD-10-CM | POA: Diagnosis not present

## 2023-07-04 DIAGNOSIS — Z6841 Body Mass Index (BMI) 40.0 and over, adult: Secondary | ICD-10-CM

## 2023-07-04 DIAGNOSIS — I5022 Chronic systolic (congestive) heart failure: Secondary | ICD-10-CM | POA: Insufficient documentation

## 2023-07-04 DIAGNOSIS — I5020 Unspecified systolic (congestive) heart failure: Secondary | ICD-10-CM

## 2023-07-04 DIAGNOSIS — Z87891 Personal history of nicotine dependence: Secondary | ICD-10-CM | POA: Diagnosis not present

## 2023-07-04 DIAGNOSIS — I428 Other cardiomyopathies: Secondary | ICD-10-CM | POA: Diagnosis not present

## 2023-07-04 DIAGNOSIS — T783XXD Angioneurotic edema, subsequent encounter: Secondary | ICD-10-CM | POA: Insufficient documentation

## 2023-07-04 DIAGNOSIS — E785 Hyperlipidemia, unspecified: Secondary | ICD-10-CM | POA: Insufficient documentation

## 2023-07-04 DIAGNOSIS — Z79899 Other long term (current) drug therapy: Secondary | ICD-10-CM | POA: Diagnosis not present

## 2023-07-04 DIAGNOSIS — I110 Hypertensive heart disease with heart failure: Secondary | ICD-10-CM | POA: Insufficient documentation

## 2023-07-04 DIAGNOSIS — T464X5D Adverse effect of angiotensin-converting-enzyme inhibitors, subsequent encounter: Secondary | ICD-10-CM | POA: Insufficient documentation

## 2023-07-04 DIAGNOSIS — Z8616 Personal history of COVID-19: Secondary | ICD-10-CM | POA: Insufficient documentation

## 2023-07-04 LAB — BASIC METABOLIC PANEL
Anion gap: 14 (ref 5–15)
BUN: 21 mg/dL — ABNORMAL HIGH (ref 6–20)
CO2: 21 mmol/L — ABNORMAL LOW (ref 22–32)
Calcium: 9.2 mg/dL (ref 8.9–10.3)
Chloride: 102 mmol/L (ref 98–111)
Creatinine, Ser: 1.3 mg/dL — ABNORMAL HIGH (ref 0.61–1.24)
GFR, Estimated: >60 mL/min (ref >60)
Glucose, Bld: 104 mg/dL — ABNORMAL HIGH (ref 70–99)
Potassium: 4 mmol/L (ref 3.5–5.1)
Sodium: 137 mmol/L (ref 135–145)

## 2023-07-04 LAB — ECHOCARDIOGRAM COMPLETE
AR max vel: 3.78 cm2
AV Area VTI: 3.34 cm2
AV Area mean vel: 3.38 cm2
AV Mean grad: 4.5 mmHg
AV Peak grad: 7.3 mmHg
Ao pk vel: 1.35 m/s
Calc EF: 36.3 %
S' Lateral: 4.7 cm
Single Plane A2C EF: 38.1 %
Single Plane A4C EF: 27 %

## 2023-07-04 LAB — BRAIN NATRIURETIC PEPTIDE: B Natriuretic Peptide: 20.6 pg/mL (ref 0.0–100.0)

## 2023-07-04 MED ORDER — CARVEDILOL 25 MG PO TABS: 25.0000 mg | ORAL_TABLET | Freq: Two times a day (BID) | ORAL | 11 refills | Status: DC

## 2023-07-04 NOTE — Patient Instructions (Signed)
INCREASE Carvedilol to 25 mg Twice daily  Labs done today, your results will be available in MyChart, we will contact you for abnormal readings.  Genetic test has been done, this has to be sent to [place] to be processed and can take 1-2 weeks to get results back.  We will let you know the results.  Your physician recommends that you schedule a follow-up appointment in: 3 months (October) **PLEASE CALL THE OFFICE IN MID AUGUST TO ARRANGE YOUR FOLLOW UP APPOINTMENT. **  If you have any questions or concerns before your next appointment please send [place] a message through [place] or call our office at [phone].    TO LEAVE A MESSAGE FOR THE NURSE SELECT OPTION 2, PLEASE LEAVE A MESSAGE INCLUDING: YOUR NAME DATE OF BIRTH CALL BACK NUMBER REASON FOR CALL**this is important as we prioritize the call backs  YOU WILL RECEIVE A CALL BACK THE SAME DAY AS LONG AS YOU CALL BEFORE 4:00 PM  At the [facility], you and your health needs are our priority. As part of our continuing mission to provide you with exceptional heart care, we have created designated Provider Care Teams. These Care Teams include your primary Cardiologist (physician) and Advanced Practice Providers (APPs- Physician Assistants and Nurse Practitioners) who all work together to provide you with the care you need, when you need it.   You may see any of the following providers on your designated Care Team at your next follow up: Dr Arvilla Meres Dr Marca Ancona Dr. Marcos Eke, NP Robbie Lis, [place] [facility] [place], [place] Brynda Peon, NP Karle Plumber, PharmD   Please be sure to bring in all your medications bottles to every appointment.    Thank you for choosing [facility]

## 2023-07-28 ENCOUNTER — Encounter: Payer: Self-pay | Admitting: *Deleted

## 2023-10-20 NOTE — Telephone Encounter (Signed)
Patients wife called in to ask about getting patients cpap. Reached out to Brad at [facility] and he found the order had been voided back in August on the 26/24. [employer] [facility] tried to call them somebody answered the phone they lost the call tried calling them back couldn't reach no-one so [employer] voided the order. Nida Boatman [place] will resubmit the order to [employer] [facility] and asked patients wife to call the [place] branch to let them know the patient is actually going through with the set up this time.

## 2023-10-31 ENCOUNTER — Ambulatory Visit: Payer: BC Managed Care – PPO | Attending: Cardiology | Admitting: Cardiology

## 2023-11-03 ENCOUNTER — Encounter: Payer: Self-pay | Admitting: Cardiology

## 2023-11-20 ENCOUNTER — Encounter (HOSPITAL_COMMUNITY): Payer: Self-pay | Admitting: Emergency Medicine

## 2023-11-20 ENCOUNTER — Emergency Department (HOSPITAL_COMMUNITY)
Admission: EM | Admit: 2023-11-20 | Discharge: 2023-11-20 | Disposition: A | Discharge status: Home / Self Care | Payer: BC Managed Care – PPO | Attending: Emergency Medicine | Admitting: Emergency Medicine

## 2023-11-20 DIAGNOSIS — G510 Bell's palsy: Secondary | ICD-10-CM | POA: Insufficient documentation

## 2023-11-20 DIAGNOSIS — Z79899 Other long term (current) drug therapy: Secondary | ICD-10-CM | POA: Insufficient documentation

## 2023-11-20 DIAGNOSIS — I10 Essential (primary) hypertension: Secondary | ICD-10-CM | POA: Insufficient documentation

## 2023-11-20 MED ORDER — PREDNISONE 20 MG PO TABS: 60.0000 mg | ORAL_TABLET | Freq: Every day | ORAL | 0 refills | Status: AC

## 2023-11-20 MED ORDER — VALACYCLOVIR HCL 1 G PO TABS: 1000.0000 mg | ORAL_TABLET | Freq: Three times a day (TID) | ORAL | 0 refills | Status: AC

## 2023-11-20 MED ORDER — HYPROMELLOSE (GONIOSCOPIC) 2.5 % OP SOLN: 1.0000 [drp] | Freq: Four times a day (QID) | OPHTHALMIC | 12 refills | Status: DC | PRN

## 2023-11-20 NOTE — ED Triage Notes (Signed)
PT felt as if he was losing feeling on r side of face 12/4 around lunch time. Pt's R side is not moving, and drooping. No neuro deficits. Speech slightly impaired due to lack of mouth coordination. Pt denies chest pain, SOB, dizziness.

## 2023-11-20 NOTE — ED Provider Notes (Signed)
[place] EMERGENCY DEPARTMENT AT [facility] Provider Note   CSN: [id number] Arrival date & time: 11/20/23  1478     History  Chief Complaint  Patient presents with   Facial Droop    Henry Chandler is a 42 y.o. male.  Henry Chandler is a 42 y.o. male with a history of hypertension, HFrEF, who presents to the emergency department for evaluation of right-sided facial droop.  He reports this occurred around lunchtime yesterday.  He reports that the symptoms have been localized just to his face and he has not experienced any weakness or numbness in his extremities.  He denies any visual changes.  No headache, no dizziness.  He has never had similar symptoms.  He denies any slurred speech or word finding difficulty.  He reports that he feels like his voice sounds a bit funny more so due to the right side of his mouth not moving normally.  No other aggravating or alleviating factors.  The history is provided by the patient and medical records.       Home Medications Prior to Admission medications   Medication Sig Start Date End Date Taking? Authorizing Provider  acetaminophen (TYLENOL) 325 MG tablet Take 650 mg by mouth every 6 (six) hours as needed for mild pain.    [provider]  atorvastatin (LIPITOR) 40 MG tablet Take 1 tablet (40 mg total) by mouth daily at 6 PM. 05/15/22   Willeen Niece, MD  carvedilol (COREG) 25 MG tablet Take 1 tablet (25 mg total) by mouth 2 (two) times daily with a meal. 07/04/23   Sabharwal, Aditya, DO  empagliflozin (JARDIANCE) 10 MG TABS tablet Take 1 tablet (10 mg total) by mouth daily. 04/11/23   Sabharwal, Aditya, DO  furosemide (LASIX) 20 MG tablet TAKE 1 TABLET(20 MG) BY MOUTH DAILY 06/10/23   Jake Bathe, MD  irbesartan (AVAPRO) 300 MG tablet Take 1 tablet (300 mg total) by mouth daily. 05/16/22   Willeen Niece, MD  isosorbide-hydrALAZINE (BIDIL) 20-37.5 MG tablet Take 1.5 tablets by mouth 3 (three) times daily.  04/11/23   Sabharwal, Aditya, DO  spironolactone (ALDACTONE) 50 MG tablet Take 0.5 tablets (25 mg total) by mouth daily. 06/10/22   Tereso Newcomer T, PA-C      Allergies    Lisinopril    Review of Systems   Review of Systems  Constitutional:  Negative for appetite change and fever.  Eyes:  Negative for pain and visual disturbance.  Neurological:  Positive for facial asymmetry. Negative for dizziness, tremors, seizures, syncope, speech difficulty, weakness, light-headedness, numbness and headaches.  All other systems reviewed and are negative.   Physical Exam Updated Vital Signs BP (!) 186/112 (BP Location: Left Arm)   Pulse 89   Temp 98.1 F (36.7 C) (Oral)   Resp 18   SpO2 96%  Physical Exam Vitals and nursing note reviewed.  Constitutional:      General: He is not in acute distress.    Appearance: Normal appearance. He is well-developed. He is not diaphoretic.  HENT:     Head: Normocephalic and atraumatic.  Eyes:     General:        Right eye: No discharge.        Left eye: No discharge.     Pupils: Pupils are equal, round, and reactive to light.  Cardiovascular:     Rate and Rhythm: Normal rate and regular rhythm.     Pulses: Normal pulses.  Heart sounds: Normal heart sounds.  Pulmonary:     Effort: Pulmonary effort is normal. No respiratory distress.     Breath sounds: Normal breath sounds. No wheezing or rales.     Comments: Respirations equal and unlabored, patient able to speak in full sentences, lungs clear to auscultation bilaterally  Abdominal:     General: Bowel sounds are normal. There is no distension.     Palpations: Abdomen is soft. There is no mass.     Tenderness: There is no abdominal tenderness. There is no guarding.     Comments: Abdomen soft, nondistended, nontender to palpation in all quadrants without guarding or peritoneal signs  Musculoskeletal:        General: No deformity.     Cervical back: Neck supple.  Skin:    General: Skin is warm  and dry.     Capillary Refill: Capillary refill takes less than 2 seconds.  Neurological:     Mental Status: He is alert and oriented to person, place, and time.     Coordination: Coordination normal.     Comments: Speech is clear, able to follow commands Right sided facial droop which involves the forehead, all other cranial nerves intact Normal strength in upper and lower extremities bilaterally including dorsiflexion and plantar flexion, strong and equal grip strength Sensation normal to light and sharp touch Moves extremities without ataxia, coordination intact  Psychiatric:        Mood and Affect: Mood normal.        Behavior: Behavior normal.     ED Results / Procedures / Treatments   Labs (all labs ordered are listed, but only abnormal results are displayed) Labs Reviewed - No data to display  EKG None  Radiology No results found.  Procedures Procedures    Medications Ordered in ED Medications - No data to display  ED Course/ Medical Decision Making/ A&P                                 Medical Decision Making Risk OTC drugs. Prescription drug management.   43 year old male presents with right-sided facial droop starting at 12:00 yesterday with no other associated neurologic symptoms.  Facial droop includes the forehead and presentation is consistent with Bell's palsy.  No prior history.  Given clear presentation of Bell's palsy do not feel that imaging or further diagnostic workup is indicated at this time.  Will treat with course of steroids and antivirals.  Encourage patient to use eye patch or take eyes shut and discussed PCP follow-up in the next 3 to 5 days.  Patient noted to be hypertensive on arrival but did not take his blood pressure medication yet this morning.  At this time there does not appear to be any evidence of an acute emergency medical condition requiring further emergent evaluation and the patient appears stable for discharge with appropriate  outpatient follow up. Diagnosis and return precautions discussed with patient who verbalizes understanding and is agreeable to discharge.           Final Clinical Impression(s) / ED Diagnoses Final diagnoses:  Bell's palsy    Rx / DC Orders ED Discharge Orders          Ordered    valACYclovir (VALTREX) 1000 MG tablet  3 times daily        11/20/23 0741    predniSONE (DELTASONE) 20 MG tablet  Daily  11/20/23 0741    hydroxypropyl methylcellulose / hypromellose (ISOPTO TEARS / GONIOVISC) 2.5 % ophthalmic solution  4 times daily PRN        11/20/23 0741              Rosezella Rumpf, PA-C 11/24/23 0912    Melene Plan, DO 11/24/23 1250

## 2023-11-20 NOTE — Discharge Instructions (Signed)
You have bells palsy this effects the nerve that controls the muscles of your face on one side.  Use eyedrops to help keep the eye moist since it is hard to keep it closed.  You should purchase an eye patch at the drugstore and wear this at night when sleeping to keep you from scratching your eye or tape your eye shut.  Take prescribed steroids and antivirals for the next 7 days to help improve your symptoms.  Follow-up closely with your primary care provider.  If you develop any weakness numbness or other neurologic symptoms outside of the right side of your face please return to the ED for further evaluation.

## 2023-12-04 ENCOUNTER — Other Ambulatory Visit (HOSPITAL_COMMUNITY): Payer: Self-pay | Admitting: Physician Assistant

## 2024-09-16 ENCOUNTER — Telehealth (HOSPITAL_COMMUNITY): Payer: Self-pay

## 2024-09-16 NOTE — Telephone Encounter (Signed)
 Called to confirm/remind patient of their appointment at the [facility] on 09/17/24.   Appointment:   [x] Confirmed  [] Left mess   [] No answer/No voice mail  [] VM Full/unable to leave message  [] Phone not in service  Patient reminded to bring all medications and/or complete list.  Confirmed patient has transportation. Gave directions, instructed to utilize valet parking.

## 2024-09-17 ENCOUNTER — Encounter (HOSPITAL_COMMUNITY): Payer: Self-pay

## 2024-09-17 ENCOUNTER — Ambulatory Visit (HOSPITAL_COMMUNITY)
Admission: RE | Admit: 2024-09-17 | Discharge: 2024-09-17 | Disposition: A | Discharge status: Home / Self Care | Payer: Self-pay | Source: Ambulatory Visit | Attending: Family Medicine | Admitting: Family Medicine

## 2024-09-17 ENCOUNTER — Ambulatory Visit (HOSPITAL_COMMUNITY): Payer: Self-pay | Admitting: Family Medicine

## 2024-09-17 VITALS — BP 130/100 | HR 104 | Wt 376.6 lb

## 2024-09-17 DIAGNOSIS — G4733 Obstructive sleep apnea (adult) (pediatric): Secondary | ICD-10-CM | POA: Insufficient documentation

## 2024-09-17 DIAGNOSIS — Z888 Allergy status to other drugs, medicaments and biological substances status: Secondary | ICD-10-CM | POA: Diagnosis not present

## 2024-09-17 DIAGNOSIS — I10 Essential (primary) hypertension: Secondary | ICD-10-CM | POA: Diagnosis not present

## 2024-09-17 DIAGNOSIS — Z7984 Long term (current) use of oral hypoglycemic drugs: Secondary | ICD-10-CM | POA: Diagnosis not present

## 2024-09-17 DIAGNOSIS — F17290 Nicotine dependence, other tobacco product, uncomplicated: Secondary | ICD-10-CM | POA: Insufficient documentation

## 2024-09-17 DIAGNOSIS — Z6841 Body Mass Index (BMI) 40.0 and over, adult: Secondary | ICD-10-CM | POA: Insufficient documentation

## 2024-09-17 DIAGNOSIS — I428 Other cardiomyopathies: Secondary | ICD-10-CM | POA: Insufficient documentation

## 2024-09-17 DIAGNOSIS — E66813 Obesity, class 3: Secondary | ICD-10-CM | POA: Diagnosis not present

## 2024-09-17 DIAGNOSIS — I5022 Chronic systolic (congestive) heart failure: Secondary | ICD-10-CM | POA: Diagnosis not present

## 2024-09-17 DIAGNOSIS — Z79899 Other long term (current) drug therapy: Secondary | ICD-10-CM | POA: Diagnosis not present

## 2024-09-17 DIAGNOSIS — I110 Hypertensive heart disease with heart failure: Secondary | ICD-10-CM | POA: Diagnosis not present

## 2024-09-17 DIAGNOSIS — R0602 Shortness of breath: Secondary | ICD-10-CM | POA: Insufficient documentation

## 2024-09-17 DIAGNOSIS — I5020 Unspecified systolic (congestive) heart failure: Secondary | ICD-10-CM | POA: Diagnosis present

## 2024-09-17 LAB — BASIC METABOLIC PANEL WITH GFR
Anion gap: 10 (ref 5–15)
BUN: 7 mg/dL (ref 6–20)
CO2: 24 mmol/L (ref 22–32)
Calcium: 9.1 mg/dL (ref 8.9–10.3)
Chloride: 104 mmol/L (ref 98–111)
Creatinine, Ser: 1.12 mg/dL (ref 0.61–1.24)
GFR, Estimated: >60 mL/min (ref >60)
Glucose, Bld: 122 mg/dL — ABNORMAL HIGH (ref 70–99)
Potassium: 4.1 mmol/L (ref 3.5–5.1)
Sodium: 138 mmol/L (ref 135–145)

## 2024-09-17 LAB — BRAIN NATRIURETIC PEPTIDE: B Natriuretic Peptide: 424.1 pg/mL — ABNORMAL HIGH (ref 0.0–100.0)

## 2024-09-17 MED ORDER — ATORVASTATIN CALCIUM 40 MG PO TABS: 40.0000 mg | ORAL_TABLET | Freq: Every day | ORAL | 3 refills | Status: DC

## 2024-09-17 MED ORDER — ISOSORB DINITRATE-HYDRALAZINE 20-37.5 MG PO TABS: ORAL_TABLET | ORAL | 3 refills | Status: DC

## 2024-09-17 MED ORDER — SPIRONOLACTONE 25 MG PO TABS: 25.0000 mg | ORAL_TABLET | Freq: Every day | ORAL | 3 refills | Status: DC

## 2024-09-17 MED ORDER — CARVEDILOL 25 MG PO TABS: 25.0000 mg | ORAL_TABLET | Freq: Two times a day (BID) | ORAL | 11 refills | Status: DC

## 2024-09-17 MED ORDER — EMPAGLIFLOZIN 10 MG PO TABS: 10.0000 mg | ORAL_TABLET | Freq: Every day | ORAL | 3 refills | Status: DC

## 2024-09-17 MED ORDER — FUROSEMIDE 20 MG PO TABS: 20.0000 mg | ORAL_TABLET | Freq: Every day | ORAL | 3 refills | Status: DC

## 2024-09-17 NOTE — Patient Instructions (Addendum)
 Re-start Bidil 1/2 tablet three times daily. Updated Rx sent. Re-start Lasix  20 mg daily - Rx sent. Refills sent on all your heart failure medications. Labs today - will call you if abnormal. Echo has been ordered - see below. Referral sent for genetic counseling. That office should call you to schedule, if not, please contact them at number below. Referral sent to Pulmonary to help manage your sleep apnea. They should call you to schedule, if not, please contact them at number below. Rx provided for home blood pressure cuff. Please take your BP daily and record in log. Provided you with scale today. Please record your daily weights in BP log. Return to Heart Failure [facility] Clinic in 3 weeks - see below. Please call us  at [phone] if any questions or concerns prior to your next visit.

## 2024-09-17 NOTE — Progress Notes (Signed)
 ADVANCED HEART FAILURE CLINIC NOTE   Primary Care: Vonita Quince, [place] Primary Cardiologist: Dr. Jeffrie HF Cardiologist: Dr. Gardenia  HPI: Henry Chandler is a 43 y.o. male with a history of uncontrolled hypertension, heart failure with severely reduced EF, hyperlipidemia, history of COVID-pneumonia complicated by respiratory failure, morbid obesity.  According to Mr. Ouch, he has had uncontrolled HTN since [age] years old. He has been on anti-hypertensives since that time. He was on and off medications during this time. In 2023, he was off his medications for some time. During a PCP visit, he started having chest pain & SOB; he admitted to [facility] for hypertensive urgency. He subsequently had an echocardiogram in May 2023 with LVEF of 25-30%. Since that time he has also seen Dr. Fernande who has continued to uptitrate his GDMT.  Echo 7/24 showed EF 30-35%, RV normal. Last seen 06/2023.   Genetic testing shows a variant of unknown significance.  Interval hx: Today he returns for HF follow up. We have not seen him since 06/2023. Overall feeling fair. Breathing is fair. He is using inhaler 7x/day. He has SOB with work duties and walking further distances on flat ground. Denies palpitations, abnormal bleeding, CP, dizziness, edema, or PND/Orthopnea. Appetite ok. Not weighing at home. Has early satiety. Taking all medications. Works as a Chartered certified accountant. He vapes, drinks ETOH on weekends, no drugs. He snores, had a sleep study, never got machine. Drinks ~ 2L/fluid a day. Lives with wife.   Past Medical History:  Diagnosis Date   Back pain    Habitual alcohol use    HFrEF (heart failure with reduced ejection fraction) (HCC) 05/14/2022   Echocardiogram 04/2022: EF 20-25, RVSP 33.9, mild MR, effusion Cardiac catheterization 04/2022: no CAD    Hypertension    Morbid obesity (HCC)    Pneumonia due to COVID-19 virus 05/2019   Snoring    Suspected sleep apnea    Tobacco abuse    Current  Outpatient Medications  Medication Sig Dispense Refill   acetaminophen  (TYLENOL ) 325 MG tablet Take 650 mg by mouth every 6 (six) hours as needed for mild pain.     irbesartan  (AVAPRO ) 300 MG tablet Take 1 tablet (300 mg total) by mouth daily. 30 tablet 1   valACYclovir  (VALTREX ) 1000 MG tablet Take 1 tablet (1,000 mg total) by mouth 3 (three) times daily. 21 tablet 0   atorvastatin  (LIPITOR) 40 MG tablet Take 1 tablet (40 mg total) by mouth daily at 6 PM. 90 tablet 3   carvedilol  (COREG ) 25 MG tablet Take 1 tablet (25 mg total) by mouth 2 (two) times daily with a meal. 60 tablet 11   empagliflozin  (JARDIANCE ) 10 MG TABS tablet Take 1 tablet (10 mg total) by mouth daily. 90 tablet 3   furosemide  (LASIX ) 20 MG tablet Take 1 tablet (20 mg total) by mouth daily. 90 tablet 3   hydroxypropyl methylcellulose / hypromellose (ISOPTO TEARS / GONIOVISC) 2.5 % ophthalmic solution Place 1 drop into the right eye 4 (four) times daily as needed for dry eyes. 15 mL 12   isosorbide -hydrALAZINE  (BIDIL) 20-37.5 MG tablet Take 1/2 tablet three times daily 45 tablet 3   spironolactone  (ALDACTONE ) 25 MG tablet Take 1 tablet (25 mg total) by mouth daily. 90 tablet 3   No current facility-administered medications for this encounter.    Allergies  Allergen Reactions   Lisinopril  Anaphylaxis and Swelling    Swelling of tongue      Social History   Socioeconomic  History   Marital status: Married    Spouse name: Lameco   Number of children: 0   Years of education: GED   Highest education level: Not on file  Occupational History   Occupation: Occupational psychologist: [employer]  Tobacco Use   Smoking status: Former    Current packs/day: 0.25    Average packs/day: 0.3 packs/day for 17.0 years (4.3 ttl pk-yrs)    Types: Cigarettes   Smokeless tobacco: Never   Tobacco comments:    less than .5 PPD  Vaping Use   Vaping status: Never Used  Substance and Sexual Activity   Alcohol use: Not Currently     Alcohol/week: 10.0 - 15.0 standard drinks of alcohol    Types: 10 - 15 Shots of liquor per week    Comment: 10 -15 shots per week   Drug use: Yes    Types: Marijuana    Comment: daily   Sexual activity: Yes    Birth control/protection: Pill  Other Topics Concern   Not on file  Social History Narrative   Regular exercise-yes   Caffeine Use-yes   Social Drivers of Health   Financial Resource Strain: Low Risk  (01/02/2024)   Received from [facility]   Overall Financial Resource Strain (CARDIA)    Difficulty of Paying Living Expenses: Not hard at all  Recent Concern: Financial Resource Strain - Medium Risk (12/01/2023)   Received from [facility]   Overall Financial Resource Strain (CARDIA)    Difficulty of Paying Living Expenses: Somewhat hard  Food Insecurity: No Food Insecurity (01/02/2024)   Received from [facility]   Hunger Vital Sign    Within the past 12 months, you worried that your food would run out before you got the money to buy more.: Never true    Within the past 12 months, the food you bought just didn't last and you didn't have money to get more.: Never true  Transportation Needs: No Transportation Needs (01/02/2024)   Received from [facility] - Transportation    Lack of Transportation (Medical): No    Lack of Transportation (Non-Medical): No  Physical Activity: Sufficiently Active (01/02/2024)   Received from [facility]   Exercise Vital Sign    On average, how many days per week do you engage in moderate to strenuous exercise (like a brisk walk)?: 3 days    On average, how many minutes do you engage in exercise at this level?: 60 min  Recent Concern: Physical Activity - Insufficiently Active (12/01/2023)   Received from [facility]   Exercise Vital Sign    On average, how many days per week do you engage in moderate to strenuous exercise (like a brisk walk)?: 2 days    On average, how many minutes do you engage in exercise at this  level?: 60 min  Stress: No Stress Concern Present (01/02/2024)   Received from [facility] - Occupational Stress Questionnaire    Feeling of Stress : Only a little  Recent Concern: Stress - Stress Concern Present (12/01/2023)   Received from [facility] - Occupational Stress Questionnaire    Feeling of Stress : To some extent  Social Connections: Socially Integrated (01/02/2024)   Received from [facility]   Social Network    How would you rate your social network (family, work, friends)?: Good participation with social networks  Intimate Partner Violence:  Not At Risk (01/02/2024)   Received from [facility]   HITS    Over the last 12 months how often did your partner physically hurt you?: Never    Over the last 12 months how often did your partner insult you or talk down to you?: Never    Over the last 12 months how often did your partner threaten you with physical harm?: Never    Over the last 12 months how often did your partner scream or curse at you?: Never    Family History  Problem Relation Age of Onset   Hypertension Mother    Cancer Mother        pancreas   Hyperlipidemia Mother    Heart disease Mother        ? stent? unclear details   Hypertension Father    Hyperlipidemia Father    Heart disease Sister        Had PNA -> had to have heart transplant   Diabetes Neg Hx    Stroke Neg Hx    Wt Readings from Last 3 Encounters:  09/17/24 (!) 170.8 kg (376 lb 9.6 oz)  07/04/23 (!) 155.8 kg (343 lb 6.4 oz)  05/16/23 (!) 158.5 kg (349 lb 6.4 oz)   BP (!) 130/100   Pulse (!) 104   Wt (!) 170.8 kg (376 lb 9.6 oz)   SpO2 94%   BMI 49.69 kg/m   PHYSICAL EXAM: General:  NAD. No resp difficulty, walked into clinic HEENT: Normal Neck: Supple. JVP 12 Cor: Regular rate & rhythm. No rubs, gallops or murmurs. Lungs: Clear Abdomen: Soft, obese, nontender, nondistended.  Extremities:  No cyanosis, clubbing, rash, 1+ BLE edema Neuro: Alert & oriented x 3, moves all 4 extremities w/o difficulty. Affect pleasant.   DATA REVIEW  ECG: 04/11/23: NSR   09/17/24: NSR 89 bpm  ECHO: 7/24: EF 35-40% (per Dr. Gardenia) 04/11/23: LVEF 30-35%, normal RV function a 05/10/22: LVEF 20-25%, normal RV function.   CATH: 05/14/22: Chronic systolic heart failure with elevated LVEDP of 28 mmHg., Nonischemic cardiomyopathy with compensated volume status given pulmonary wedge mean pressure of 13 mmHg. Right dominant normal coronary arteries. Normal pulmonary artery pressure.   CMR: 07/2022: 1. Severe LVE with global hypokinesis EF 26%  2. 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  No significant delayed gadolinium uptake  4.  Normal parametric measures see above  5.  Mild LAE  6.  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  His sister required cardiac transplant a few years ago. His parents also have heart disease however he does not know the details. 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 - Refer to [employer], we discussed this today. NYHA class / AHA Stage: worse NYHA III, functional class confounded by vaping. Volume status & Diuretics: exam difficult for volume due to body habitus, but appears overloaded.  - restart Lasix  20 mg daily.  Vasodilators: Continue Avapro  30 0mg  daily; restart BiDil 0.5 tab tid. Beta-Blocker: Continue Coreg  25 mg bid MRA: Continue spironolactone  25 mg daily Cardiometabolic: Continue Jardiance  10 mg daily Devices therapies & Valvulopathies: echo EF 7/24 showed 35%-40% (per Dr. Sharol read).   Advanced therapies: Not currently indicated - Update echo -  Repeat labs today. - Gifted a scale today.  2.  Uncontrolled HTN - CT abdomen from August 2019 does not demonstrate any significant renal artery stenosis or calcification. - BP elevated - Restart meds as above - Needs CPAP - Given Rx for BP cuff. I asked him to check BP daily and log  3. Morbid Obesity - Body mass index is 49.69 kg/m. - Needs weight loss  4. Lisinopril  angioedema  - Reports 5-6 years ago having respiratory distress, throat swelling that required steroids for improvement.   5. OSA - not on CPAP - Refer to Pulm  Follow up in 2-3 weeks with APP  [facility], FNP-BC 09/17/24 Advanced Heart Failure

## 2024-09-24 ENCOUNTER — Other Ambulatory Visit (HOSPITAL_COMMUNITY): Payer: Self-pay | Admitting: Cardiology

## 2024-09-27 ENCOUNTER — Other Ambulatory Visit: Payer: Self-pay

## 2024-09-27 ENCOUNTER — Encounter (HOSPITAL_COMMUNITY): Payer: Self-pay

## 2024-09-27 ENCOUNTER — Emergency Department (HOSPITAL_COMMUNITY)

## 2024-09-27 ENCOUNTER — Inpatient Hospital Stay (HOSPITAL_COMMUNITY)
Admission: EM | Admit: 2024-09-27 | Discharge: 2024-09-30 | DRG: 291 | Disposition: A | Discharge status: Home / Self Care | Attending: Internal Medicine | Admitting: Internal Medicine

## 2024-09-27 DIAGNOSIS — Z59868 Other specified financial insecurity: Secondary | ICD-10-CM

## 2024-09-27 DIAGNOSIS — I110 Hypertensive heart disease with heart failure: Secondary | ICD-10-CM | POA: Diagnosis not present

## 2024-09-27 DIAGNOSIS — Z87892 Personal history of anaphylaxis: Secondary | ICD-10-CM

## 2024-09-27 DIAGNOSIS — Z91119 Patient's noncompliance with dietary regimen due to unspecified reason: Secondary | ICD-10-CM

## 2024-09-27 DIAGNOSIS — Z7984 Long term (current) use of oral hypoglycemic drugs: Secondary | ICD-10-CM

## 2024-09-27 DIAGNOSIS — J45909 Unspecified asthma, uncomplicated: Secondary | ICD-10-CM | POA: Diagnosis present

## 2024-09-27 DIAGNOSIS — F1210 Cannabis abuse, uncomplicated: Secondary | ICD-10-CM | POA: Diagnosis present

## 2024-09-27 DIAGNOSIS — F17290 Nicotine dependence, other tobacco product, uncomplicated: Secondary | ICD-10-CM | POA: Diagnosis present

## 2024-09-27 DIAGNOSIS — I2489 Other forms of acute ischemic heart disease: Secondary | ICD-10-CM | POA: Diagnosis present

## 2024-09-27 DIAGNOSIS — E66813 Obesity, class 3: Secondary | ICD-10-CM | POA: Diagnosis present

## 2024-09-27 DIAGNOSIS — Z8616 Personal history of COVID-19: Secondary | ICD-10-CM

## 2024-09-27 DIAGNOSIS — R0602 Shortness of breath: Principal | ICD-10-CM

## 2024-09-27 DIAGNOSIS — J9601 Acute respiratory failure with hypoxia: Secondary | ICD-10-CM | POA: Diagnosis present

## 2024-09-27 DIAGNOSIS — Z6841 Body Mass Index (BMI) 40.0 and over, adult: Secondary | ICD-10-CM

## 2024-09-27 DIAGNOSIS — I10 Essential (primary) hypertension: Secondary | ICD-10-CM | POA: Diagnosis present

## 2024-09-27 DIAGNOSIS — Z8249 Family history of ischemic heart disease and other diseases of the circulatory system: Secondary | ICD-10-CM

## 2024-09-27 DIAGNOSIS — Z79899 Other long term (current) drug therapy: Secondary | ICD-10-CM

## 2024-09-27 DIAGNOSIS — E785 Hyperlipidemia, unspecified: Secondary | ICD-10-CM | POA: Diagnosis present

## 2024-09-27 DIAGNOSIS — I472 Ventricular tachycardia, unspecified: Secondary | ICD-10-CM | POA: Diagnosis present

## 2024-09-27 DIAGNOSIS — I5023 Acute on chronic systolic (congestive) heart failure: Secondary | ICD-10-CM | POA: Diagnosis present

## 2024-09-27 DIAGNOSIS — Z1152 Encounter for screening for COVID-19: Secondary | ICD-10-CM

## 2024-09-27 DIAGNOSIS — I428 Other cardiomyopathies: Secondary | ICD-10-CM | POA: Diagnosis present

## 2024-09-27 DIAGNOSIS — F17210 Nicotine dependence, cigarettes, uncomplicated: Secondary | ICD-10-CM | POA: Diagnosis present

## 2024-09-27 DIAGNOSIS — I493 Ventricular premature depolarization: Secondary | ICD-10-CM | POA: Diagnosis present

## 2024-09-27 DIAGNOSIS — R7989 Other specified abnormal findings of blood chemistry: Secondary | ICD-10-CM

## 2024-09-27 DIAGNOSIS — G4733 Obstructive sleep apnea (adult) (pediatric): Secondary | ICD-10-CM | POA: Diagnosis present

## 2024-09-27 DIAGNOSIS — Z8701 Personal history of pneumonia (recurrent): Secondary | ICD-10-CM

## 2024-09-27 DIAGNOSIS — Z83438 Family history of other disorder of lipoprotein metabolism and other lipidemia: Secondary | ICD-10-CM

## 2024-09-27 DIAGNOSIS — Z888 Allergy status to other drugs, medicaments and biological substances status: Secondary | ICD-10-CM

## 2024-09-27 LAB — CBC
HCT: 45.8 % (ref 39.0–52.0)
Hemoglobin: 14.3 g/dL (ref 13.0–17.0)
MCH: 27.3 pg (ref 26.0–34.0)
MCHC: 31.2 g/dL (ref 30.0–36.0)
MCV: 87.6 fL (ref 80.0–100.0)
Platelets: 282 K/uL (ref 150–400)
RBC: 5.23 MIL/uL (ref 4.22–5.81)
RDW: 13.6 % (ref 11.5–15.5)
WBC: 12.1 K/uL — ABNORMAL HIGH (ref 4.0–10.5)
nRBC: 0 % (ref 0.0–0.2)

## 2024-09-27 LAB — RESP PANEL BY RT-PCR (RSV, FLU A&B, COVID)  RVPGX2
Influenza A by PCR: NEGATIVE
Influenza B by PCR: NEGATIVE
Resp Syncytial Virus by PCR: NEGATIVE
SARS Coronavirus 2 by RT PCR: NEGATIVE

## 2024-09-27 LAB — BASIC METABOLIC PANEL WITH GFR
Anion gap: 9 (ref 5–15)
BUN: 18 mg/dL (ref 6–20)
CO2: 22 mmol/L (ref 22–32)
Calcium: 9 mg/dL (ref 8.9–10.3)
Chloride: 104 mmol/L (ref 98–111)
Creatinine, Ser: 1.04 mg/dL (ref 0.61–1.24)
GFR, Estimated: >60 mL/min (ref >60)
Glucose, Bld: 83 mg/dL (ref 70–99)
Potassium: 4.1 mmol/L (ref 3.5–5.1)
Sodium: 136 mmol/L (ref 135–145)

## 2024-09-27 LAB — D-DIMER, QUANTITATIVE: D-Dimer, Quant: 0.58 ug{FEU}/mL — ABNORMAL HIGH (ref 0.00–0.50)

## 2024-09-27 LAB — TROPONIN T, HIGH SENSITIVITY
Troponin T High Sensitivity: 29 ng/L — ABNORMAL HIGH (ref 0–19)
Troponin T High Sensitivity: 26 ng/L — ABNORMAL HIGH (ref 0–19)

## 2024-09-27 LAB — MAGNESIUM: Magnesium: 2.1 mg/dL (ref 1.7–2.4)

## 2024-09-27 LAB — PRO BRAIN NATRIURETIC PEPTIDE: Pro Brain Natriuretic Peptide: 409 pg/mL — ABNORMAL HIGH (ref <300.0)

## 2024-09-27 MED ORDER — FUROSEMIDE 10 MG/ML IJ SOLN
40.0000 mg | Freq: Once | INTRAMUSCULAR | Status: AC
  Administered 2024-09-27: 40 mg via INTRAVENOUS
  Filled 2024-09-27: qty 4

## 2024-09-27 MED ORDER — IPRATROPIUM-ALBUTEROL 0.5-2.5 (3) MG/3ML IN SOLN
3.0000 mL | Freq: Once | RESPIRATORY_TRACT | Status: AC
  Administered 2024-09-27: 3 mL via RESPIRATORY_TRACT
  Filled 2024-09-27: qty 3

## 2024-09-27 MED ORDER — POTASSIUM CHLORIDE CRYS ER 20 MEQ PO TBCR
40.0000 meq | EXTENDED_RELEASE_TABLET | Freq: Once | ORAL | Status: AC
  Administered 2024-09-27: 40 meq via ORAL
  Filled 2024-09-27: qty 2

## 2024-09-27 MED ORDER — IPRATROPIUM-ALBUTEROL 0.5-2.5 (3) MG/3ML IN SOLN
3.0000 mL | Freq: Once | RESPIRATORY_TRACT | Status: AC
  Administered 2024-09-27: 3 mL via RESPIRATORY_TRACT

## 2024-09-27 MED ORDER — IOHEXOL 350 MG/ML SOLN
75.0000 mL | Freq: Once | INTRAVENOUS | Status: AC | PRN
  Administered 2024-09-27: 75 mL via INTRAVENOUS

## 2024-09-27 MED ORDER — PREDNISONE 20 MG PO TABS
60.0000 mg | ORAL_TABLET | Freq: Once | ORAL | Status: AC
  Administered 2024-09-27: 60 mg via ORAL
  Filled 2024-09-27: qty 3

## 2024-09-27 NOTE — ED Notes (Addendum)
 Pt RR increased to 38 and complaint of SOB, placed on 2L of O2 and raised head of bed. RR fell to 27.

## 2024-09-27 NOTE — ED Triage Notes (Signed)
 Patient said when he walks a lot he gets winded and has a cough. Feels like he has a lot of drainage in his throat. Has a sore throat more to the right side. Burns to swallow.

## 2024-09-27 NOTE — ED Provider Notes (Signed)
 [place] EMERGENCY DEPARTMENT AT [facility] Provider Note   CSN: [id number] Arrival date & time: 09/27/24  1611     Patient presents with: Shortness of Breath  HPI [name] is a 43 y.o. male with hypertension, CHF, current smoker presenting for shortness of breath.  He states has been going on for a month.  He states it is worse when he is active at work.  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  Also states in the last week he has had more nasal drainage in the back of his throat with some throat irritation.  Denies chest pain.  It does hurt at times when he swallows.  Denies fever.  He does state he does smoke primarily on the weekends.  {Add pertinent medical, surgical, social history, OB history to HPI:32947}  Shortness of Breath      Prior to Admission medications   Medication Sig Start Date End Date Taking? Authorizing Provider  acetaminophen  (TYLENOL ) 325 MG tablet Take 650 mg by mouth every 6 (six) hours as needed for mild pain.    [provider]  atorvastatin  (LIPITOR) 40 MG tablet Take 1 tablet (40 mg total) by mouth daily at 6 PM. 09/17/24   Milford, Harlene HERO, FNP  carvedilol  (COREG ) 25 MG tablet Take 1 tablet (25 mg total) by mouth 2 (two) times daily with a meal. 09/17/24   Milford, Harlene HERO, FNP  empagliflozin  (JARDIANCE ) 10 MG TABS tablet Take 1 tablet (10 mg total) by mouth daily. 09/17/24   Milford, Harlene HERO, FNP  furosemide  (LASIX ) 20 MG tablet Take 1 tablet (20 mg total) by mouth daily. 09/17/24   Milford, Harlene HERO, FNP  hydroxypropyl methylcellulose / hypromellose (ISOPTO TEARS / GONIOVISC) 2.5 % ophthalmic solution Place 1 drop into the right eye 4 (four) times daily as needed for dry eyes. 11/20/23   Alva Larraine FALCON, PA-C  irbesartan  (AVAPRO ) 300 MG tablet Take 1 tablet (300 mg total) by mouth daily. 05/16/22   Leotis Bogus, MD  isosorbide -hydrALAZINE  (BIDIL) 20-37.5 MG tablet Take 1/2 tablet  three times daily 09/17/24   Milford, Jessica M, FNP  spironolactone  (ALDACTONE ) 25 MG tablet Take 1 tablet (25 mg total) by mouth daily. 09/17/24   Milford, Harlene HERO, FNP  valACYclovir  (VALTREX ) 1000 MG tablet Take 1 tablet (1,000 mg total) by mouth 3 (three) times daily. 11/20/23   Alva Larraine FALCON, PA-C    Allergies: Lisinopril     Review of Systems  Respiratory:  Positive for shortness of breath.     Updated Vital Signs BP (!) 150/101 (BP Location: Right Arm)   Pulse 84   Temp 98.9 F (37.2 C) (Oral)   Resp 20   Ht 6' 1 (1.854 m)   Wt (!) 170.1 kg   SpO2 96%   BMI 49.48 kg/m   Physical Exam Vitals and nursing note reviewed.  HENT:     Head: Normocephalic and atraumatic.     Mouth/Throat:     Mouth: Mucous membranes are moist.  Eyes:     General:        Right eye: No discharge.        Left eye: No discharge.     Conjunctiva/sclera: Conjunctivae normal.  Cardiovascular:     Rate and Rhythm: Normal rate and regular rhythm.     Pulses: Normal pulses.     Heart sounds: Normal heart sounds.  Pulmonary:     Effort: Pulmonary effort  is normal.     Breath sounds: Examination of the right-upper field reveals wheezing. Examination of the left-upper field reveals wheezing. Examination of the right-middle field reveals wheezing. Examination of the left-middle field reveals wheezing. Wheezing present. No decreased breath sounds, rhonchi or rales.  Abdominal:     General: Abdomen is flat.     Palpations: Abdomen is soft.  Skin:    General: Skin is warm and dry.  Neurological:     General: No focal deficit present.  Psychiatric:        Mood and Affect: Mood normal.     (all labs ordered are listed, but only abnormal results are displayed) Labs Reviewed  RESP PANEL BY RT-PCR (RSV, FLU A&B, COVID)  [id number]  BASIC METABOLIC PANEL WITH GFR  CBC    EKG: None  Radiology: No results found.  {Document cardiac monitor, telemetry assessment procedure when  appropriate:32947} Procedures   Medications Ordered in the ED  predniSONE  (DELTASONE ) tablet 60 mg (has no administration in time range)  ipratropium-albuterol  (DUONEB) 0.5-2.5 (3) MG/3ML nebulizer solution 3 mL (has no administration in time range)      {Click here for ABCD2, HEART and other calculators REFRESH Note before signing:1}                              Medical Decision Making Amount and/or Complexity of Data Reviewed Labs: ordered. Radiology: ordered.  Risk Prescription drug management.   ***  {Document critical care time when appropriate  Document review of labs and clinical decision tools ie CHADS2VASC2, etc  Document your independent review of radiology images and any outside records  Document your discussion with family members, caretakers and with consultants  Document social determinants of health affecting pt's care  Document your decision making why or why not admission, treatments were needed:32947:::1}   Final diagnoses:  None    ED Discharge Orders     None

## 2024-09-28 ENCOUNTER — Inpatient Hospital Stay (HOSPITAL_COMMUNITY)

## 2024-09-28 DIAGNOSIS — Z59868 Other specified financial insecurity: Secondary | ICD-10-CM | POA: Diagnosis not present

## 2024-09-28 DIAGNOSIS — I493 Ventricular premature depolarization: Secondary | ICD-10-CM | POA: Diagnosis present

## 2024-09-28 DIAGNOSIS — Z7984 Long term (current) use of oral hypoglycemic drugs: Secondary | ICD-10-CM | POA: Diagnosis not present

## 2024-09-28 DIAGNOSIS — Z8249 Family history of ischemic heart disease and other diseases of the circulatory system: Secondary | ICD-10-CM | POA: Diagnosis not present

## 2024-09-28 DIAGNOSIS — Z79899 Other long term (current) drug therapy: Secondary | ICD-10-CM | POA: Diagnosis not present

## 2024-09-28 DIAGNOSIS — Z888 Allergy status to other drugs, medicaments and biological substances status: Secondary | ICD-10-CM | POA: Diagnosis not present

## 2024-09-28 DIAGNOSIS — I472 Ventricular tachycardia, unspecified: Secondary | ICD-10-CM | POA: Diagnosis present

## 2024-09-28 DIAGNOSIS — Z83438 Family history of other disorder of lipoprotein metabolism and other lipidemia: Secondary | ICD-10-CM | POA: Diagnosis not present

## 2024-09-28 DIAGNOSIS — J9601 Acute respiratory failure with hypoxia: Secondary | ICD-10-CM | POA: Diagnosis present

## 2024-09-28 DIAGNOSIS — J45909 Unspecified asthma, uncomplicated: Secondary | ICD-10-CM | POA: Diagnosis present

## 2024-09-28 DIAGNOSIS — I10 Essential (primary) hypertension: Secondary | ICD-10-CM

## 2024-09-28 DIAGNOSIS — I110 Hypertensive heart disease with heart failure: Secondary | ICD-10-CM | POA: Diagnosis present

## 2024-09-28 DIAGNOSIS — Z8616 Personal history of COVID-19: Secondary | ICD-10-CM | POA: Diagnosis not present

## 2024-09-28 DIAGNOSIS — F17210 Nicotine dependence, cigarettes, uncomplicated: Secondary | ICD-10-CM | POA: Diagnosis present

## 2024-09-28 DIAGNOSIS — F17290 Nicotine dependence, other tobacco product, uncomplicated: Secondary | ICD-10-CM | POA: Diagnosis present

## 2024-09-28 DIAGNOSIS — I5023 Acute on chronic systolic (congestive) heart failure: Secondary | ICD-10-CM | POA: Diagnosis present

## 2024-09-28 DIAGNOSIS — Z1152 Encounter for screening for COVID-19: Secondary | ICD-10-CM | POA: Diagnosis not present

## 2024-09-28 DIAGNOSIS — I428 Other cardiomyopathies: Secondary | ICD-10-CM | POA: Diagnosis present

## 2024-09-28 DIAGNOSIS — E785 Hyperlipidemia, unspecified: Secondary | ICD-10-CM | POA: Diagnosis present

## 2024-09-28 DIAGNOSIS — G4733 Obstructive sleep apnea (adult) (pediatric): Secondary | ICD-10-CM

## 2024-09-28 DIAGNOSIS — I2489 Other forms of acute ischemic heart disease: Secondary | ICD-10-CM | POA: Diagnosis present

## 2024-09-28 DIAGNOSIS — Z8701 Personal history of pneumonia (recurrent): Secondary | ICD-10-CM | POA: Diagnosis not present

## 2024-09-28 DIAGNOSIS — F1210 Cannabis abuse, uncomplicated: Secondary | ICD-10-CM | POA: Diagnosis present

## 2024-09-28 DIAGNOSIS — Z6841 Body Mass Index (BMI) 40.0 and over, adult: Secondary | ICD-10-CM | POA: Diagnosis not present

## 2024-09-28 DIAGNOSIS — E66813 Obesity, class 3: Secondary | ICD-10-CM | POA: Diagnosis present

## 2024-09-28 DIAGNOSIS — R7989 Other specified abnormal findings of blood chemistry: Secondary | ICD-10-CM

## 2024-09-28 LAB — URINE DRUG SCREEN
Amphetamines: NEGATIVE
Barbiturates: NEGATIVE
Benzodiazepines: NEGATIVE
Cocaine: NEGATIVE
Fentanyl: NEGATIVE
Methadone Scn, Ur: NEGATIVE
Opiates: NEGATIVE
Tetrahydrocannabinol: NEGATIVE

## 2024-09-28 LAB — COMPREHENSIVE METABOLIC PANEL WITH GFR
ALT: 41 U/L (ref 0–44)
AST: 24 U/L (ref 15–41)
Albumin: 4.3 g/dL (ref 3.5–5.0)
Alkaline Phosphatase: 91 U/L (ref 38–126)
Anion gap: 12 (ref 5–15)
BUN: 17 mg/dL (ref 6–20)
CO2: 25 mmol/L (ref 22–32)
Calcium: 9.9 mg/dL (ref 8.9–10.3)
Chloride: 101 mmol/L (ref 98–111)
Creatinine, Ser: 1.26 mg/dL — ABNORMAL HIGH (ref 0.61–1.24)
GFR, Estimated: >60 mL/min (ref >60)
Glucose, Bld: 133 mg/dL — ABNORMAL HIGH (ref 70–99)
Potassium: 3.9 mmol/L (ref 3.5–5.1)
Sodium: 138 mmol/L (ref 135–145)
Total Bilirubin: 0.5 mg/dL (ref 0.0–1.2)
Total Protein: 7.8 g/dL (ref 6.5–8.1)

## 2024-09-28 LAB — CBC
HCT: 49.6 % (ref 39.0–52.0)
Hemoglobin: 15.1 g/dL (ref 13.0–17.0)
MCH: 26.9 pg (ref 26.0–34.0)
MCHC: 30.4 g/dL (ref 30.0–36.0)
MCV: 88.3 fL (ref 80.0–100.0)
Platelets: 321 K/uL (ref 150–400)
RBC: 5.62 MIL/uL (ref 4.22–5.81)
RDW: 13.7 % (ref 11.5–15.5)
WBC: 12 K/uL — ABNORMAL HIGH (ref 4.0–10.5)
nRBC: 0 % (ref 0.0–0.2)

## 2024-09-28 LAB — ECHOCARDIOGRAM COMPLETE
Area-P 1/2: 4.49 cm2
Height: 73 in
S' Lateral: 5.9 cm
Weight: 5952 [oz_av]

## 2024-09-28 LAB — HIV ANTIBODY (ROUTINE TESTING W REFLEX): HIV Screen 4th Generation wRfx: NONREACTIVE

## 2024-09-28 MED ORDER — FUROSEMIDE 10 MG/ML IJ SOLN
40.0000 mg | Freq: Two times a day (BID) | INTRAMUSCULAR | Status: DC
  Administered 2024-09-28 – 2024-09-30 (×5): 40 mg via INTRAVENOUS
  Filled 2024-09-28 (×5): qty 4

## 2024-09-28 MED ORDER — ENOXAPARIN SODIUM 80 MG/0.8ML IJ SOSY
80.0000 mg | PREFILLED_SYRINGE | INTRAMUSCULAR | Status: DC
  Administered 2024-09-29 – 2024-09-30 (×2): 80 mg via SUBCUTANEOUS
  Filled 2024-09-28 (×2): qty 0.8

## 2024-09-28 MED ORDER — CARVEDILOL 25 MG PO TABS
25.0000 mg | ORAL_TABLET | Freq: Two times a day (BID) | ORAL | Status: DC
  Administered 2024-09-28 – 2024-09-30 (×5): 25 mg via ORAL
  Filled 2024-09-28 (×5): qty 1

## 2024-09-28 MED ORDER — ACETAMINOPHEN 650 MG RE SUPP: 650.0000 mg | Freq: Four times a day (QID) | RECTAL | Status: DC | PRN

## 2024-09-28 MED ORDER — ISOSORB DINITRATE-HYDRALAZINE 20-37.5 MG PO TABS
0.5000 | ORAL_TABLET | Freq: Three times a day (TID) | ORAL | Status: DC
  Administered 2024-09-28 – 2024-09-30 (×6): 0.5 via ORAL
  Filled 2024-09-28 (×8): qty 0.5

## 2024-09-28 MED ORDER — POTASSIUM CHLORIDE 20 MEQ PO PACK
40.0000 meq | PACK | Freq: Once | ORAL | Status: AC
  Administered 2024-09-28: 40 meq via ORAL
  Filled 2024-09-28: qty 2

## 2024-09-28 MED ORDER — EMPAGLIFLOZIN 10 MG PO TABS
10.0000 mg | ORAL_TABLET | Freq: Every day | ORAL | Status: DC
  Administered 2024-09-28 – 2024-09-30 (×3): 10 mg via ORAL
  Filled 2024-09-28 (×3): qty 1

## 2024-09-28 MED ORDER — TIZANIDINE HCL 4 MG PO TABS
2.0000 mg | ORAL_TABLET | Freq: Once | ORAL | Status: AC
  Administered 2024-09-28: 2 mg via ORAL
  Filled 2024-09-28: qty 1

## 2024-09-28 MED ORDER — ACETAMINOPHEN 325 MG PO TABS: 650.0000 mg | ORAL_TABLET | Freq: Four times a day (QID) | ORAL | Status: DC | PRN

## 2024-09-28 MED ORDER — HYDRALAZINE HCL 20 MG/ML IJ SOLN
5.0000 mg | Freq: Four times a day (QID) | INTRAMUSCULAR | Status: DC | PRN
Start: 2024-09-28 — End: 2024-09-30

## 2024-09-28 MED ORDER — PERFLUTREN LIPID MICROSPHERE
1.0000 mL | INTRAVENOUS | Status: AC | PRN
  Administered 2024-09-28: 3 mL via INTRAVENOUS

## 2024-09-28 MED ORDER — IRBESARTAN 300 MG PO TABS
300.0000 mg | ORAL_TABLET | Freq: Every day | ORAL | Status: DC
  Administered 2024-09-28 – 2024-09-30 (×3): 300 mg via ORAL
  Filled 2024-09-28 (×3): qty 1

## 2024-09-28 MED ORDER — ATORVASTATIN CALCIUM 40 MG PO TABS
40.0000 mg | ORAL_TABLET | Freq: Every day | ORAL | Status: DC
  Administered 2024-09-28 – 2024-09-29 (×2): 40 mg via ORAL
  Filled 2024-09-28 (×2): qty 1

## 2024-09-28 MED ORDER — SPIRONOLACTONE 25 MG PO TABS
25.0000 mg | ORAL_TABLET | Freq: Every day | ORAL | Status: DC
Start: 2024-09-28 — End: 2024-09-30
  Administered 2024-09-28 – 2024-09-30 (×3): 25 mg via ORAL
  Filled 2024-09-28 (×3): qty 1

## 2024-09-28 MED ORDER — ENOXAPARIN SODIUM 40 MG/0.4ML IJ SOSY
40.0000 mg | PREFILLED_SYRINGE | INTRAMUSCULAR | Status: DC
  Administered 2024-09-28: 40 mg via SUBCUTANEOUS
  Filled 2024-09-28: qty 0.4

## 2024-09-28 MED ORDER — NICOTINE 14 MG/24HR TD PT24: 14.0000 mg | MEDICATED_PATCH | Freq: Every day | TRANSDERMAL | Status: DC | PRN
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
## 2024-09-28 NOTE — Plan of Care (Signed)
  Problem: Education: Goal: Knowledge of General Education information will improve Description: Including pain rating scale, medication(s)/side effects and non-pharmacologic comfort measures Outcome: Progressing   Problem: Health Behavior/Discharge Planning: Goal: Ability to manage health-related needs will improve Outcome: Progressing   Problem: Clinical Measurements: Goal: Cardiovascular complication will be avoided Outcome: Progressing   Problem: Activity: Goal: Risk for activity intolerance will decrease Outcome: Progressing   Problem: Nutrition: Goal: Adequate nutrition will be maintained Outcome: Progressing   Problem: Elimination: Goal: Will not experience complications related to bowel motility Outcome: Progressing Goal: Will not experience complications related to urinary retention Outcome: Progressing   Problem: Skin Integrity: Goal: Risk for impaired skin integrity will decrease Outcome: Progressing

## 2024-09-28 NOTE — Hospital Course (Signed)
 42yo with h/o uncontrolled HTN, chronic HFrEF due to ischemic/hypertensive cardiomyopathy, HLD, morbid obesity, OSA not on CPAP, and tobacco and marijuana use who presented on 10/13 with SOB. Found to have pulmonary edema from CXR exacerbation, started on Lasix  as well as Duonebs and prednisone .  CHF team consulted.

## 2024-09-28 NOTE — Progress Notes (Signed)
 Progress Note   Patient: Henry Chandler FMW:[id number] DOB: 1981/06/21 DOA: 09/27/2024     0 DOS: the patient was seen and examined on 09/28/2024   Brief hospital course: 42yo with h/o uncontrolled HTN, chronic HFrEF due to ischemic/hypertensive cardiomyopathy, HLD, morbid obesity, OSA not on CPAP, and tobacco and marijuana use who presented on 10/13 with SOB. Found to have pulmonary edema from CXR exacerbation, started on Lasix  as well as Duonebs and prednisone .  CHF team consulted.  Assessment and Plan:  Acute hypoxemic respiratory failure secondary to acute on chronic HFrEF Patient is presenting with 2 to 29-month history of progressively worsening dyspnea Dietary noncompliance likely contributing Seen by cardiology earlier this month on 10/3 and Lasix  20 mg daily restarted Presenting proBNP 409 CTA chest negative for PE but showing mild pulmonary edema Placed on 2 L supplemental oxygen in the ED due to increased work of breathing and mild hypoxia Last echo done 07/04/2023 showing EF 30 to 35%; EF now 20-25% with severe LV dilation and grade 2 DD Continue diuresis with IV Lasix  40 mg twice daily Continue home Avapro , BiDil, Coreg , spironolactone , and Jardiance  Monitor intake and output, daily weights, and renal function Low-sodium diet with fluid restriction Continue supplemental oxygen Cardiology consulting   Uncontrolled hypertension Continue IV Lasix  and home p.o. medications including Avapro , BiDil, Coreg , and spironolactone  IV hydralazine  PRN SBP >160 Cardiology consulted, as above   Mild troponin elevation Likely due to demand ischemia, pattern not consistent with ACS Patient is not endorsing chest pain   OSA He is not on CPAP  Recommend outpatient sleep study   History of asthma/COPD? Patient reports history of cigarette smoking/vaping  No prior pulmonary function testing done He was given DuoNeb x 3 and prednisone  60 mg in the ED No wheezing appreciated on  exam  Suspect his dyspnea is mostly due to decompensated heart failure/pulmonary edema Continue to monitor closely and continue bronchodilators/steroids if needed Smoking cessation encouraged; will order nicotine patch  Marijuana use Cessation encouraged If he does not desire cessation, he is encouraged to consider gummy use instead to avoid pulmonary effects   Hyperlipidemia Continue Lipitor  Morbid/class 3 obesity Body mass index is 49.08 kg/m.SABRA  Weight loss should be encouraged If possible, he is very likely to benefit from GLP-1 Outpatient PCP/bariatric medicine f/u encouraged Significantly low or high BMI is associated with higher medical risk including morbidity and mortality     Consultants: Cardiology  Procedures: None  Antibiotics: None  30 Day Unplanned Readmission Risk Score    Flowsheet Row ED to Hosp-Admission (Current) from 09/27/2024 in [facility] 4TH FLOOR PROGRESSIVE CARE AND UROLOGY  30 Day Unplanned Readmission Risk Score (%) 11.78 Filed at 09/28/2024 0400    This score is the patient's risk of an unplanned readmission within 30 days of being discharged (0 -100%). The score is based on dignosis, age, lab data, medications, orders, and past utilization.   Low:  0-14.9   Medium: 15-21.9   High: 22-29.9   Extreme: 30 and above           Subjective: Feeling better today.  Not on home O2.  His sister died of cardiomyopathy s/p heart transplant.   Objective: Vitals:   09/28/24 0607 09/28/24 1045  BP: 138/82 121/75  Pulse: 89 69  Resp: 18 20  Temp: 98.2 F (36.8 C) 98.4 F (36.9 C)  SpO2: 97% 95%    Intake/Output Summary (Last 24 hours) at 09/28/2024 1615 Last data filed at 09/28/2024 1233  Gross per 24 hour  Intake 120 ml  Output 2600 ml  Net -2480 ml   Filed Weights   09/27/24 1620 09/28/24 0244  Weight: (!) 170.1 kg (!) 168.7 kg    Exam:  General:  Appears calm and comfortable and is in NAD, on [place] O2 Eyes:  normal lids,  iris ENT:  grossly normal hearing, lips & tongue, mmm Cardiovascular:  RRR. No LE edema.  Respiratory:   CTA bilaterally with no wheezes/rales/rhonchi.  Normal respiratory effort. Abdomen:  soft, NT, ND Skin:  no rash or induration seen on limited exam Musculoskeletal:  grossly normal tone BUE/BLE, good ROM, no bony abnormality Psychiatric:  grossly normal mood and affect, speech fluent and appropriate, AOx3 Neurologic:  CN 2-12 grossly intact, moves all extremities in coordinated fashion  Data Reviewed: I have reviewed the patient's lab results since admission.  Pertinent labs for today include:   Glucose 133 BUN 17/Creatinine 1.26/GFR >60 WBC 12     Family Communication: Mother was present throughout     Code Status: Full Code   Disposition: Status is: Inpatient Remains inpatient appropriate because: ongoing monitoring     Time spent: 50 minutes  Unresulted Labs (From admission, onward)     Start     Ordered   09/29/24 0500  CBC with Differential/Platelet  Tomorrow morning,   R        09/28/24 1615   09/29/24 0500  Basic metabolic panel with GFR  Tomorrow morning,   R        09/28/24 1615             Author: Delon Herald, MD 09/28/2024 4:15 PM  For on call review [email or web address].

## 2024-09-28 NOTE — Progress Notes (Signed)
 Heart Failure Navigator Progress Note  Assessed for Heart & Vascular TOC clinic readiness.  Patient does not meet criteria due to he is a Advanced Heart Failure Team patient of Dr. Gardenia. .   Navigator will sign off at this time.   Stephane Haddock, BSN, Scientist, clinical (histocompatibility and immunogenetics) Only

## 2024-09-28 NOTE — Consult Note (Addendum)
 Cardiology Consultation  Patient ID: [name] MRN: [id number]; DOB: [birth year]  Admit date: 09/27/2024 Date of Consult: 09/28/2024  PCP:  Vonita Quince, PA   [facility] Providers Cardiologist:  Oneil Parchment, MD     Patient Profile: Henry Chandler is a 43 y.o. male with a hx of uncontrolled hypertension, HFrEF (last known EF 30 to 35%), hyperlipidemia, COVID-pneumonia complicated by respiratory failure, current smoker/vaper, morbid obesity, OSA not on CPAP, who is being seen 09/28/2024 for the evaluation of acute on chronic HFrEF at the request of Dr. Barbarann.  History of Present Illness: Henry Chandler has past medical history as stated above.  He presented to [facility] emergency department on 09/27/2024 with shortness of breath.  He reported that his shortness of breath and ongoing for about a month but has been getting worse, especially when trying to be active at work.  Relevant workup in the ED/since admitted includes: BMP was stable on arrival, creatinine slightly 20 up today at 1.26, CBC shows leukocytosis at 12, troponin mildly elevated and flat at 29 ? 26, respiratory panel negative, D-dimer mildly elevated at 0.58, proBNP elevated at 409.  CTPA showed mild pulmonary edema with ground glass opacities and interlobular septal thickening, mediastinal lymphadenopathy, no PE.  When he originally presented to the ED he was hypertensive with SBP as high as 160s.He has been started on most of his home medications with BP controlled this AM at 138/82.  He was last seen by advanced heart failure clinic 09/17/2024 for follow-up.  Prior to that he had not been seen since 06/2023.  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  He denied any chest pain, palpitations, dizziness, orthopnea.  He noted that he was not  taking his weights daily at home.  He reported compliance with all of his medications.  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  It was noted at this exam that to determine his volume status was difficult due to body habitus but it appeared that he was volume overloaded therefore Lasix  20 mg daily was restarted.  His current regimen at discharge was as follows: Lasix  20 mg daily, irbesartan  300 mg daily, BiDil 20-37.5 mg half tablet 3 times daily, carvedilol  25 mg twice daily, spironolactone  25 mg daily, Jardiance  10 mg daily.  He was given a scale at this appointment, repeat labs were drawn, and he was scheduled for an outpatient echocardiogram.  The plan is for him to follow back up in 2 to 3 weeks with an APP.Lab results from this appointment showed: BNP elevated at 424, BMP was fairly stable.  After speaking with the patient, he agrees with the history as stated above. He tells me that he was taken off Lasix  about one year ago in hopes to not harm his kidneys. He was just recently restarted on Lasix  but he believes that it was likely the lack of long term Lasix  along with drinking more fluids and eating high salt content that triggered this episode. He tells me that he works 2 jobs and is eating fast food more often  than he would like to out of convenience. He has been doing very well with the IV Lasix  here. He tells me he feels significantly better than he did on arrival and he has been having good urine output. He denies any issues with medications at home and agrees that he was given a prescription for BP cuff and scale at his last AHF visit to be able to better monitor himself as an outpatient.   Past Medical History:  Diagnosis Date   Back pain    Habitual alcohol use     HFrEF (heart failure with reduced ejection fraction) (HCC) 05/14/2022   Echocardiogram 04/2022: EF 20-25, RVSP 33.9, mild MR, effusion Cardiac catheterization 04/2022: no CAD    Hypertension    Morbid obesity (HCC)    Pneumonia due to COVID-19 virus 05/2019   Snoring    Suspected sleep apnea    Tobacco abuse    Past Surgical History:  Procedure Laterality Date   RIGHT/LEFT HEART CATH AND CORONARY ANGIOGRAPHY N/A 05/14/2022   Procedure: RIGHT/LEFT HEART CATH AND CORONARY ANGIOGRAPHY;  Surgeon: Claudene Victory ORN, MD;  Location: [facility] INVASIVE CV LAB;  Service: Cardiovascular;  Laterality: N/A;    Home Medications:  Prior to Admission medications   Medication Sig Start Date End Date Taking? Authorizing Provider  acetaminophen  (TYLENOL ) 325 MG tablet Take 650 mg by mouth every 6 (six) hours as needed for mild pain.   Yes [provider]  AIRSUPRA 90-80 MCG/ACT AERO Inhale 2 puffs into the lungs every 4 (four) hours as needed.   Yes [provider]  albuterol  (VENTOLIN  HFA) 108 (90 Base) MCG/ACT inhaler Inhale 2 puffs into the lungs every 6 (six) hours as needed.   Yes [provider]  atorvastatin  (LIPITOR) 40 MG tablet Take 1 tablet (40 mg total) by mouth daily at 6 PM. 09/17/24  Yes Milford, Harlene HERO, FNP  carvedilol  (COREG ) 25 MG tablet Take 1 tablet (25 mg total) by mouth 2 (two) times daily with a meal. 09/17/24  Yes Milford, Harlene HERO, FNP  empagliflozin  (JARDIANCE ) 10 MG TABS tablet Take 1 tablet (10 mg total) by mouth daily. 09/17/24  Yes Milford, Harlene HERO, FNP  furosemide  (LASIX ) 20 MG tablet Take 1 tablet (20 mg total) by mouth daily. 09/17/24  Yes Milford, Harlene HERO, FNP  irbesartan  (AVAPRO ) 300 MG tablet Take 1 tablet (300 mg total) by mouth daily. 05/16/22  Yes Leotis Bogus, MD  isosorbide -hydrALAZINE  (BIDIL) 20-37.5 MG tablet Take 1/2 tablet three times daily 09/17/24  Yes Milford, Regal, FNP  spironolactone  (ALDACTONE ) 25 MG tablet Take 1 tablet (25 mg  total) by mouth daily. 09/17/24  Yes Milford, Harlene HERO, FNP  valACYclovir  (VALTREX ) 1000 MG tablet Take 1 tablet (1,000 mg total) by mouth 3 (three) times daily. Patient taking differently: Take 1,000 mg by mouth 3 (three) times daily as needed. 11/20/23  Yes Kehrli, Kelsey F, PA-C  hydroxypropyl methylcellulose / hypromellose (ISOPTO TEARS / GONIOVISC) 2.5 % ophthalmic solution Place 1 drop into the right eye 4 (four) times daily as needed for dry eyes. Patient not taking: Reported on 09/28/2024 11/20/23   Kehrli, Kelsey F, PA-C   Scheduled Meds:  atorvastatin   40 mg Oral q1800   carvedilol   25 mg Oral BID WC   empagliflozin   10 mg Oral Daily   enoxaparin  (LOVENOX ) injection  40 mg Subcutaneous Q24H   furosemide   40 mg Intravenous BID   irbesartan   300 mg Oral Daily   isosorbide -hydrALAZINE   0.5  tablet Oral TID   spironolactone   25 mg Oral Daily   Continuous Infusions:  PRN Meds: acetaminophen  **OR** acetaminophen , hydrALAZINE , perflutren lipid microspheres (DEFINITY) IV suspension  Allergies:    Allergies  Allergen Reactions   Lisinopril  Anaphylaxis and Swelling    Swelling of tongue   Social History:   Social History   Socioeconomic History   Marital status: Married    Spouse name: Lameco   Number of children: 0   Years of education: GED   Highest education level: Not on file  Occupational History   Occupation: Occupational psychologist: [employer]  Tobacco Use   Smoking status: Former    Current packs/day: 0.25    Average packs/day: 0.3 packs/day for 17.0 years (4.3 ttl pk-yrs)    Types: Cigarettes   Smokeless tobacco: Never   Tobacco comments:    less than .5 PPD  Vaping Use   Vaping status: Never Used  Substance and Sexual Activity   Alcohol use: Yes    Alcohol/week: 10.0 - 15.0 standard drinks of alcohol    Types: 10 - 15 Shots of liquor per week   Drug use: Not Currently    Types: Marijuana   Sexual activity: Yes    Birth control/protection: Pill   Other Topics Concern   Not on file  Social History Narrative   Regular exercise-yes   Caffeine Use-yes   Social Drivers of Health   Financial Resource Strain: Low Risk  (01/02/2024)   Received from [facility]   Overall Financial Resource Strain (CARDIA)    Difficulty of Paying Living Expenses: Not hard at all  Recent Concern: Financial Resource Strain - Medium Risk (12/01/2023)   Received from [facility]   Overall Financial Resource Strain (CARDIA)    Difficulty of Paying Living Expenses: Somewhat hard  Food Insecurity: No Food Insecurity (09/28/2024)   Hunger Vital Sign    Worried About Running Out of Food in the Last Year: Never true    Ran Out of Food in the Last Year: Never true  Transportation Needs: No Transportation Needs (09/28/2024)   PRAPARE - Administrator, Civil Service (Medical): No    Lack of Transportation (Non-Medical): No  Physical Activity: Sufficiently Active (01/02/2024)   Received from [facility]   Exercise Vital Sign    On average, how many days per week do you engage in moderate to strenuous exercise (like a brisk walk)?: 3 days    On average, how many minutes do you engage in exercise at this level?: 60 min  Recent Concern: Physical Activity - Insufficiently Active (12/01/2023)   Received from [facility]   Exercise Vital Sign    On average, how many days per week do you engage in moderate to strenuous exercise (like a brisk walk)?: 2 days    On average, how many minutes do you engage in exercise at this level?: 60 min  Stress: No Stress Concern Present (01/02/2024)   Received from [facility] - Occupational Stress Questionnaire    Feeling of Stress : Only a little  Recent Concern: Stress - Stress Concern Present (12/01/2023)   Received from [facility] of Occupational Health - Occupational Stress Questionnaire    Feeling of Stress : To some extent  Social  Connections: Socially Integrated (01/02/2024)   Received from [facility]   Social Network    How would you rate your social network (family,  work, friends)?: Good participation with social networks  Intimate Partner Violence: Not At Risk (09/28/2024)   Humiliation, Afraid, Rape, and Kick questionnaire    Fear of Current or Ex-Partner: No    Emotionally Abused: No    Physically Abused: No    Sexually Abused: No    Family History:   Family History  Problem Relation Age of Onset   Hypertension Mother    Cancer Mother        pancreas   Hyperlipidemia Mother    Heart disease Mother        ? stent? unclear details   Hypertension Father    Hyperlipidemia Father    Heart disease Sister        Had PNA -> had to have heart transplant   Diabetes Neg Hx    Stroke Neg Hx     ROS:  Please see the history of present illness.   Physical Exam/Data: Vitals:   09/27/24 2315 09/28/24 0040 09/28/24 0244 09/28/24 0607  BP: (!) 167/100  (!) 142/87 138/82  Pulse: 96  100 89  Resp: (!) 23  18 18   Temp:  98.4 F (36.9 C) 98.7 F (37.1 C) 98.2 F (36.8 C)  TempSrc:  Oral Oral Oral  SpO2: 92%  94% 97%  Weight:   (!) 168.7 kg   Height:   6' 1 (1.854 m)     Intake/Output Summary (Last 24 hours) at 09/28/2024 0926 Last data filed at 09/28/2024 0811 Gross per 24 hour  Intake 120 ml  Output 1500 ml  Net -1380 ml      09/28/2024    2:44 AM 09/27/2024    4:20 PM 09/17/2024    9:20 AM  Last 3 Weights  Weight (lbs) 372 lb 375 lb 376 lb 9.6 oz  Weight (kg) 168.738 kg 170.099 kg 170.825 kg     Body mass index is 49.08 kg/m.   General:  in no acute distress, ambulating around room w/o difficulty HEENT: normal Vascular: No carotid bruits; Distal pulses 2+ bilaterally Cardiac:  normal S1, S2; RRR; no murmur  Lungs:  clear to auscultation bilaterally, no wheezing, rhonchi or rales  Abd: soft, nontender, no hepatomegaly  Ext: trace edema Musculoskeletal:  No deformities Skin: warm and  dry  Neuro:   no focal abnormalities noted Psych:  Normal affect   EKG:  The EKG was personally reviewed and demonstrates:  sinus rhythm, HR 89  Telemetry:  Telemetry was personally reviewed and demonstrates:  sinus rhythm, HR 70s, PVCs, brief run of NSVT   Relevant CV Studies:  Echocardiogram, 09/28/2024 Ordered, pending results  Echocardiogram, 07/04/2023 Left ventricular ejection fraction, by estimation, is 30 to 35% . The left ventricle has moderately decreased function. The left ventricle demonstrates global hypokinesis. The left ventricular internal cavity size was mildly dilated. There is mild left ventricular hypertrophy. Left ventricular diastolic parameters were normal. The average left ventricular global longitudinal strain is - 6. 2 % . The global longitudinal strain is abnormal.  Right ventricular systolic function is normal. The right ventricular size is normal. There is normal pulmonary artery systolic pressure.  Left atrial size was moderately dilated.  The mitral valve is abnormal. Trivial mitral valve regurgitation. No evidence of mitral stenosis. The aortic valve is tricuspid. Aortic valve regurgitation is not visualized. No aortic stenosis is present.  The inferior vena cava is normal in size with greater than 50% respiratory variability, suggesting right atrial pressure of 3 mmHg.  Laboratory Data:  Chemistry Recent  Labs  Lab 09/27/24 1740 09/27/24 2134 09/28/24 0407  NA 136  --  138  K 4.1  --  3.9  CL 104  --  101  CO2 22  --  25  GLUCOSE 83  --  133*  BUN 18  --  17  CREATININE 1.04  --  1.26*  CALCIUM  9.0  --  9.9  MG  --  2.1  --   GFRNONAA >60  --  >60  ANIONGAP 9  --  12    Recent Labs  Lab 09/28/24 0407  PROT 7.8  ALBUMIN 4.3  AST 24  ALT 41  ALKPHOS 91  BILITOT 0.5    Hematology Recent Labs  Lab 09/27/24 1740 09/28/24 0407  WBC 12.1* 12.0*  RBC 5.23 5.62  HGB 14.3 15.1  HCT 45.8 49.6  MCV 87.6 88.3  MCH 27.3 26.9  MCHC 31.2  30.4  RDW 13.6 13.7  PLT 282 321   Thyroid No results for input(s): TSH, FREET4 in the last 168 hours.  BNP Recent Labs  Lab 09/27/24 1740  PROBNP 409.0*    DDimer  Recent Labs  Lab 09/27/24 1740  DDIMER 0.58*   Radiology/Studies:  CT Angio Chest PE W/Cm &/Or Wo Cm Result Date: 09/27/2024 EXAM: CTA of the Chest without and with contrast for PE 09/27/2024 09:15:58 PM TECHNIQUE: CTA of the chest was performed without and with the administration of 75 mL iohexol  (OMNIPAQUE ) 350 MG/ML injection. Multiplanar reformatted images are provided for review. MIP images are provided for review. Automated exposure control, iterative reconstruction, and/or weight based adjustment of the mA/kV was utilized to reduce the radiation dose to as low as reasonably achievable. COMPARISON: CT chest dated 07/21/2018. CLINICAL HISTORY: Pulmonary embolism (PE) suspected, high prob; Pulmonary embolism (PE) suspected, low to intermediate prob, positive D-dimer. Patient said when he walks a lot he gets winded and has a cough. Feels like he has a lot of drainage in his throat. Has a sore throat more to the right side. Burns to swallow. FINDINGS: PULMONARY ARTERIES: Pulmonary arteries are adequately opacified for evaluation. No pulmonary embolism. Main pulmonary artery is normal in caliber. MEDIASTINUM: Heart is moderately enlarged. The pericardium demonstrates no acute abnormality. There is no acute abnormality of the thoracic aorta. LYMPH NODES: There is an enlarged paratracheal lymph node measuring 14 mm short axis. Subcarinal lymph node measures 15 mm short axis. No axillary lymphadenopathy. LUNGS AND PLEURA: There are mild patchy ground-glass opacities with smooth interlobular septal thickening in the mid and inferior lungs. There are minimal ground-glass opacities patchy in the upper lobes. No focal consolidation or pulmonary edema. There is no pleural effusion. There is no pneumothorax. UPPER ABDOMEN: Limited images  of the upper abdomen are unremarkable. SOFT TISSUES AND BONES: No acute bone or soft tissue abnormality. IMPRESSION: 1. No evidence of pulmonary embolism. 2. Mild pulmonary edema pattern with ground-glass opacities and smooth interlobular septal thickening, greatest in the mid to lower lungs. 3. Mediastinal lymphadenopathy (enlarged paratracheal and subcarinal lymph nodes). Electronically signed by: Greig Pique MD 09/27/2024 09:35 PM EDT RP Workstation: [id number]   DG Chest 2 View Result Date: 09/27/2024 CLINICAL DATA:  Shortness of breath EXAM: CHEST - 2 VIEW COMPARISON:  05/10/2022 FINDINGS: Cardiomegaly. No confluent opacity, effusions or edema. No acute bony abnormality. IMPRESSION: Cardiomegaly.  No active disease. Electronically Signed   By: Franky Crease M.D.   On: 09/27/2024 17:28   Assessment and Plan:  Acute on chronic HFrEF Nonischemic cardiomyopathy Uncontrolled hypertension  Home meds: Lasix  20 mg daily, irbesartan  300 mg daily, BiDil 20-37.5 mg half tablet TID, carvedilol  25 mg BID, spironolactone  25 mg daily, Jardiance  10 mg daily proBNP elevated at 409 CTPA showed mild pulmonary edema Urine output of 1.5 L yesterday  Weight 372 lb today Patient reports signifincant improvement in symptoms from yesterday  Ambulating around room without difficulty  Identifies trigger as increased volume intake, increased salt intake (frequent fast food consumption due to working 2 jobs), just recently being restarted on PO Lasix   Currently on IV Lasix  40 mg BID, depending on results of echo suspect that he may require another day of IV Lasix  but then will likely be able to transition to PO with close outpatient AHF follow up to determine long term diuretic plan Currently on carvedilol  25 mg BID Currently on Jardiance  10 mg daily Currently on irbesartan  300 mg daily Currently on half tablet of BiDil 20-37.5 mg TID Currently on spironolactone  25 mg daily Continue daily weights, daily BMPs,  strict I's and O's Ordered updated echocardiogram, will follow results  Per primary OSA not on CPAP  Hyperlipidemia  Asthma  Risk Assessment/Risk Scores:      New [place]  Heart Association (NYHA) Functional Class NYHA Class III    For questions or updates, please contact [facility] Please consult [email or web address] for contact info under      Signed, Waddell DELENA Donath, PA-C  09/28/2024 9:26 AM  I have personally evaluated and examined the patient. The history, physical exam, and medical decision making documented below were performed independently and substantively by me. I have reviewed all relevant data, formulated the assessment and plan, and assumed responsibility for the management of this patient. My documentation reflects the substantive portion of the split/shared visit, in accordance with CMS and CPT guidelines. I have updated the NPP documentation above as appropriate.  I have personally performed the substantive portion of the medical decision making, including interpretation of diagnostic data, formulation of the management plan, and assessment of risks. In summation:  Henry Chandler is a 43 year old with heart failure with reduced ejection fraction who presents with acute and chronic heart failure exacerbation.  He has a history of heart failure with reduced ejection fraction and congestive cardiomyopathy. Currently, he is experiencing an exacerbation of both acute and chronic heart failure. He feels significantly better after diuresis. He is not on supplemental oxygen and is actively diuresing.  He has experienced episodes of non-sustained ventricular tachycardia and premature ventricular contractions (PVCs). His potassium level was noted to be 3.9, and he is on spironolactone  and an angiotensin receptor blocker.  His symptoms, including difficulty breathing and coughing, began worsening around June or July of this year. He also notes a distended abdomen, which he  attributes to fluid retention. 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  Exam notable for  Gen: no distress, morbid obesity Neck: No JVD Cardiac: No Rubs or Gallops, no Murmur, RRR +2 radial pulses Respiratory: Clear to auscultation bilaterally, normal effort, normal  respiratory rate GI: Soft, nontender, distended with dullness to percussion and a fluid wave  MS: No  edema;  moves all extremities Integument: Skin feels warm Neuro:  At time of evaluation, alert and oriented to person/place/time/situation  Psych: Normal affect, patient feels well  Personally reviewed data and interpretation:   Labs notable for  LABS K:  3.9 (09/27/2024) Magnesium : 2.1 (09/27/2024) Tele: SR with PVCS and NSVT  DIAGNOSTIC Echocardiogram: Decreased cardiac function (09/28/2024) no LV thrombus, LVEF 25%, dilated with LVIDD 7.1 cm   In assessment and plan:   Acute on chronic heart failure with reduced ejection fraction (HFrEF) and volume overload - NYHA II, Hypervolemic but improving, Stage C, HTN suspect as etiology - Currently diuresing well with improvement in symptoms. Echocardiogram shows worsening heart function. Discussed the importance of aggressive management to prevent long-term complications such as transplant or LVAD. Encouraged lifestyle modifications despite the challenges of working two jobs. Emphasized the need for long-term care and follow-up with advanced heart failure specialists and potentially outpatient social work support - Continue diuresis with goal of transitioning to oral diuretics tomorrow if continues to improve - Encourage lifestyle modifications including diet and exercise - narrow complex rhythm, discussed outpatient secondary prevention DC ICD and outpatient CPET  Premature ventricular contractions (PVCs) and nonsustained ventricular  tachycardia in the setting of HFrEF Experiencing rare PVCs and one episode of nonsustained ventricular tachycardia, likely related to congestive heart failure. Electrolyte levels are being monitored to prevent arrhythmias. Discussed the potential need for a primary prevention device in the future. - Ensure potassium levels are maintained above 4.0, continue current BB  Hypokalemia Potassium level at 3.9, slightly below target. Hypokalemia likely due to diuresis. Plan to replete potassium to maintain levels above 4.0 to prevent arrhythmias. - Administer oral potassium supplementation - Monitor potassium levels closely  Stanly Leavens, MD FASE [facility] Cardiologist [facility]  [address] [place], [address] [place], [place] [address] [phone]  1:19 PM

## 2024-09-28 NOTE — H&P (Signed)
 History and Physical    Henry Chandler FMW:[id number] DOB: [birth year] DOA: 09/27/2024  PCP: Vonita Quince, PA  Patient coming from: Home  Chief Complaint: Shortness of breath  HPI: Henry Chandler is a 43 y.o. 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  History provided by the patient and his wife at bedside.  For the past 2 or 3 months he is having progressively worsening dyspnea and cough.  He was seen by his cardiologist earlier this month on 10/3 and restarted on Lasix  20 mg daily but his breathing has not improved.  He was prescribed albuterol /budesonide inhaler by urgent care/primary care provider which did not help.  Wife states patient works 2 jobs and has to eat fast food so it is difficult for him to monitor his sodium intake.  Also he does not watch how much fluids he drinks in a day.  Patient denies chest pain.  ED Course: Tachypneic to the 30s and work of breathing improved after head of the bed raised and he was placed on 2 L [place].  Hypertensive with SBP in the 160s and DBP >100.  Afebrile.  Labs notable for WBC count 12.1, potassium 4.1, creatinine 1.0, proBNP 409, troponin 29> 26, D-dimer 0.58, COVID/influenza/RSV PCR negative, magnesium  2.1.  CTA chest negative for PE but showing mild pulmonary edema.  EKG pending.  Patient was given IV Lasix  40 mg, DuoNeb x 3, prednisone  60 mg, and oral potassium in the ED.  Review of Systems:  Review of Systems  All other systems reviewed and are negative.   Past Medical History:  Diagnosis Date   Back pain    Habitual alcohol use    HFrEF (heart failure with reduced ejection fraction) (HCC) 05/14/2022   Echocardiogram 04/2022: EF 20-25, RVSP 33.9, mild MR, effusion Cardiac catheterization  04/2022: no CAD    Hypertension    Morbid obesity (HCC)    Pneumonia due to COVID-19 virus 05/2019   Snoring    Suspected sleep apnea    Tobacco abuse     Past Surgical History:  Procedure Laterality Date   RIGHT/LEFT HEART CATH AND CORONARY ANGIOGRAPHY N/A 05/14/2022   Procedure: RIGHT/LEFT HEART CATH AND CORONARY ANGIOGRAPHY;  Surgeon: Claudene Victory ORN, MD;  Location: [facility];  Service: Cardiovascular;  Laterality: N/A;     reports that he has quit smoking. His smoking use included cigarettes. He has a 4.3 pack-year smoking history. He has never used smokeless tobacco. He reports current alcohol use of about 10.0 - 15.0 standard drinks of alcohol per week. He reports that he does not currently use drugs after having used the following drugs: Marijuana.  Allergies  Allergen Reactions   Lisinopril  Anaphylaxis and Swelling    Swelling of tongue    Family History  Problem Relation Age of Onset   Hypertension Mother    Cancer Mother        pancreas   Hyperlipidemia Mother    Heart disease Mother        ? stent? unclear details   Hypertension Father    Hyperlipidemia Father    Heart disease Sister        Had PNA -> had to have heart transplant   Diabetes Neg Hx    Stroke Neg Hx     Prior to Admission medications  Medication Sig Start Date End Date Taking? Authorizing Provider  acetaminophen  (TYLENOL ) 325 MG tablet Take 650 mg by mouth every 6 (six) hours as needed for mild pain.    [provider]  atorvastatin  (LIPITOR) 40 MG tablet Take 1 tablet (40 mg total) by mouth daily at 6 PM. 09/17/24   Milford, Harlene HERO, FNP  carvedilol  (COREG ) 25 MG tablet Take 1 tablet (25 mg total) by mouth 2 (two) times daily with a meal. 09/17/24   Milford, Harlene HERO, FNP  empagliflozin  (JARDIANCE ) 10 MG TABS tablet Take 1 tablet (10 mg total) by mouth daily. 09/17/24   Milford, Harlene HERO, FNP  furosemide  (LASIX ) 20 MG tablet Take 1 tablet (20 mg total) by mouth daily. 09/17/24    Milford, Harlene HERO, FNP  hydroxypropyl methylcellulose / hypromellose (ISOPTO TEARS / GONIOVISC) 2.5 % ophthalmic solution Place 1 drop into the right eye 4 (four) times daily as needed for dry eyes. 11/20/23   Alva Larraine FALCON, PA-C  irbesartan  (AVAPRO ) 300 MG tablet Take 1 tablet (300 mg total) by mouth daily. 05/16/22   Leotis Bogus, MD  isosorbide -hydrALAZINE  (BIDIL) 20-37.5 MG tablet Take 1/2 tablet three times daily 09/17/24   Milford, Jessica M, FNP  spironolactone  (ALDACTONE ) 25 MG tablet Take 1 tablet (25 mg total) by mouth daily. 09/17/24   Milford, Harlene HERO, FNP  valACYclovir  (VALTREX ) 1000 MG tablet Take 1 tablet (1,000 mg total) by mouth 3 (three) times daily. 11/20/23   Alva Larraine FALCON, PA-C    Physical Exam: Vitals:   09/27/24 2030 09/27/24 2045 09/27/24 2315 09/28/24 0040  BP:   (!) 167/100   Pulse: 95 92 96   Resp: (!) 38 (!) 36 (!) 23   Temp:    98.4 F (36.9 C)  TempSrc:    Oral  SpO2: 93% 94% 92%   Weight:      Height:        Physical Exam Vitals reviewed.  Constitutional:      General: He is not in acute distress. HENT:     Head: Normocephalic and atraumatic.  Eyes:     Extraocular Movements: Extraocular movements intact.  Cardiovascular:     Rate and Rhythm: Normal rate and regular rhythm.     Pulses: Normal pulses.  Pulmonary:     Effort: No respiratory distress.     Breath sounds: Rales present. No wheezing.  Abdominal:     General: Bowel sounds are normal. There is no distension.     Palpations: Abdomen is soft.     Tenderness: There is no abdominal tenderness.  Musculoskeletal:     Cervical back: Normal range of motion.     Right lower leg: No edema.     Left lower leg: No edema.  Skin:    General: Skin is warm and dry.  Neurological:     General: No focal deficit present.     Mental Status: He is alert and oriented to person, place, and time.     Labs on Admission: I have personally reviewed following labs and imaging  studies  CBC: Recent Labs  Lab 09/27/24 1740  WBC 12.1*  HGB 14.3  HCT 45.8  MCV 87.6  PLT 282   Basic Metabolic Panel: Recent Labs  Lab 09/27/24 1740 09/27/24 2134  NA 136  --   K 4.1  --   CL 104  --   CO2 22  --   GLUCOSE 83  --   BUN 18  --  CREATININE 1.04  --   CALCIUM  9.0  --   MG  --  2.1   GFR: Estimated Creatinine Clearance: 153.4 mL/min (by C-G formula based on SCr of 1.04 mg/dL). Liver Function Tests: No results for input(s): AST, ALT, ALKPHOS, BILITOT, PROT, ALBUMIN in the last 168 hours. No results for input(s): LIPASE, AMYLASE in the last 168 hours. No results for input(s): AMMONIA in the last 168 hours. Coagulation Profile: No results for input(s): INR, PROTIME in the last 168 hours. Cardiac Enzymes: No results for input(s): CKTOTAL, CKMB, CKMBINDEX, TROPONINI in the last 168 hours. BNP (last 3 results) Recent Labs    09/27/24 1740  PROBNP 409.0*   HbA1C: No results for input(s): HGBA1C in the last 72 hours. CBG: No results for input(s): GLUCAP in the last 168 hours. Lipid Profile: No results for input(s): CHOL, HDL, LDLCALC, TRIG, CHOLHDL, LDLDIRECT in the last 72 hours. Thyroid Function Tests: No results for input(s): TSH, T4TOTAL, FREET4, T3FREE, THYROIDAB in the last 72 hours. Anemia Panel: No results for input(s): VITAMINB12, FOLATE, FERRITIN, TIBC, IRON, RETICCTPCT in the last 72 hours. Urine analysis:    Component Value Date/Time   COLORURINE YELLOW 08/08/2017 0923   APPEARANCEUR Clear 04/20/2018 1027   LABSPEC 1.015 08/08/2017 0923   PHURINE 6.5 08/08/2017 0923   GLUCOSEU Negative 04/20/2018 1027   HGBUR TRACE (A) 08/08/2017 0923   BILIRUBINUR Negative 04/20/2018 1027   KETONESUR NEGATIVE 08/08/2017 0923   PROTEINUR Negative 04/20/2018 1027   PROTEINUR NEGATIVE 08/08/2017 0923   UROBILINOGEN 0.2 04/30/2017 0907   NITRITE Negative 04/20/2018 1027   NITRITE  NEGATIVE 08/08/2017 0923   LEUKOCYTESUR Negative 04/20/2018 1027    Radiological Exams on Admission: CT Angio Chest PE W/Cm &/Or Wo Cm Result Date: 09/27/2024 EXAM: CTA of the Chest without and with contrast for PE 09/27/2024 09:15:58 PM TECHNIQUE: CTA of the chest was performed without and with the administration of 75 mL iohexol  (OMNIPAQUE ) 350 MG/ML injection. Multiplanar reformatted images are provided for review. MIP images are provided for review. Automated exposure control, iterative reconstruction, and/or weight based adjustment of the mA/kV was utilized to reduce the radiation dose to as low as reasonably achievable. COMPARISON: CT chest dated 07/21/2018. CLINICAL HISTORY: Pulmonary embolism (PE) suspected, high prob; Pulmonary embolism (PE) suspected, low to intermediate prob, positive D-dimer. Patient said when he walks a lot he gets winded and has a cough. Feels like he has a lot of drainage in his throat. Has a sore throat more to the right side. Burns to swallow. FINDINGS: PULMONARY ARTERIES: Pulmonary arteries are adequately opacified for evaluation. No pulmonary embolism. Main pulmonary artery is normal in caliber. MEDIASTINUM: Heart is moderately enlarged. The pericardium demonstrates no acute abnormality. There is no acute abnormality of the thoracic aorta. LYMPH NODES: There is an enlarged paratracheal lymph node measuring 14 mm short axis. Subcarinal lymph node measures 15 mm short axis. No axillary lymphadenopathy. LUNGS AND PLEURA: There are mild patchy ground-glass opacities with smooth interlobular septal thickening in the mid and inferior lungs. There are minimal ground-glass opacities patchy in the upper lobes. No focal consolidation or pulmonary edema. There is no pleural effusion. There is no pneumothorax. UPPER ABDOMEN: Limited images of the upper abdomen are unremarkable. SOFT TISSUES AND BONES: No acute bone or soft tissue abnormality. IMPRESSION: 1. No evidence of pulmonary  embolism. 2. Mild pulmonary edema pattern with ground-glass opacities and smooth interlobular septal thickening, greatest in the mid to lower lungs. 3. Mediastinal lymphadenopathy (enlarged paratracheal and subcarinal  lymph nodes). Electronically signed by: Greig Pique MD 09/27/2024 09:35 PM EDT RP Workstation: [id number]   DG Chest 2 View Result Date: 09/27/2024 CLINICAL DATA:  Shortness of breath EXAM: CHEST - 2 VIEW COMPARISON:  05/10/2022 FINDINGS: Cardiomegaly. No confluent opacity, effusions or edema. No acute bony abnormality. IMPRESSION: Cardiomegaly.  No active disease. Electronically Signed   By: Franky Crease M.D.   On: 09/27/2024 17:28    Assessment and Plan  Acute hypoxemic respiratory failure secondary to acute on chronic HFrEF Patient is presenting with 2 to 63-month history of progressively worsening dyspnea.  Dietary noncompliance likely contributing.  Seen by cardiology earlier this month on 10/3 and Lasix  20 mg daily restarted.  proBNP 409.  CTA chest negative for PE but showing mild pulmonary edema.  Placed on 2 L supplemental oxygen in the ED due to increased work of breathing and mild hypoxia.  Last echo done 07/04/2023 showing EF 30 to 35%.  Continue diuresis with IV Lasix  40 mg twice daily.  Continue home Avapro , BiDil, Coreg , spironolactone , and Jardiance .  Monitor intake and output, daily weights, and renal function.  Low-sodium diet with fluid restriction.  Continue supplemental oxygen.  BiPAP PRN if work of breathing worsens.  I have sent a message to cardiology requesting consultation in the morning.  Uncontrolled hypertension Continue IV Lasix  and home p.o. medications including Avapro , BiDil, Coreg , and spironolactone .  IV hydralazine  PRN SBP >160.  Cardiology consulted as above.  Mild troponin elevation Likely due to demand ischemia, pattern not consistent with ACS.  Patient is not endorsing chest pain.  OSA He is not on CPAP and will need outpatient sleep  study.  History of asthma/COPD? Patient reports history of cigarette smoking/vaping and it seems he never had pulmonary function testing done.  He was given DuoNeb x 3 and prednisone  60 mg in the ED.  No wheezing appreciated on exam at this time.  Suspect his dyspnea is mostly due to decompensated heart failure/pulmonary edema.  Continue to monitor closely and continue bronchodilators/steroids if needed.  Hyperlipidemia Continue Lipitor.  DVT prophylaxis: Lovenox  Code Status: Full Code (discussed with the patient and his wife) Level of care: Progressive Care Unit Admission status: It is my clinical opinion that admission to INPATIENT is reasonable and necessary because of the expectation that this patient will require hospital care that crosses at least 2 midnights to treat this condition based on the medical complexity of the problems presented.  Given the aforementioned information, the predictability of an adverse outcome is felt to be significant.  Editha Ram MD [facility]  If 7PM-7AM, please contact night-coverage [email or web address]  09/28/2024, 1:20 AM

## 2024-09-29 DIAGNOSIS — I5023 Acute on chronic systolic (congestive) heart failure: Secondary | ICD-10-CM | POA: Diagnosis not present

## 2024-09-29 LAB — BASIC METABOLIC PANEL WITH GFR
Anion gap: 11 (ref 5–15)
BUN: 19 mg/dL (ref 6–20)
CO2: 24 mmol/L (ref 22–32)
Calcium: 9.3 mg/dL (ref 8.9–10.3)
Chloride: 102 mmol/L (ref 98–111)
Creatinine, Ser: 1.23 mg/dL (ref 0.61–1.24)
GFR, Estimated: >60 mL/min (ref >60)
Glucose, Bld: 122 mg/dL — ABNORMAL HIGH (ref 70–99)
Potassium: 3.9 mmol/L (ref 3.5–5.1)
Sodium: 137 mmol/L (ref 135–145)

## 2024-09-29 LAB — CBC WITH DIFFERENTIAL/PLATELET
Abs Immature Granulocytes: 0.05 K/uL (ref 0.00–0.07)
Basophils Absolute: 0 K/uL (ref 0.0–0.1)
Basophils Relative: 0 %
Eosinophils Absolute: 0.1 K/uL (ref 0.0–0.5)
Eosinophils Relative: 0 %
HCT: 46.6 % (ref 39.0–52.0)
Hemoglobin: 14.5 g/dL (ref 13.0–17.0)
Immature Granulocytes: 0 %
Lymphocytes Relative: 26 %
Lymphs Abs: 2.9 K/uL (ref 0.7–4.0)
MCH: 27.9 pg (ref 26.0–34.0)
MCHC: 31.1 g/dL (ref 30.0–36.0)
MCV: 89.8 fL (ref 80.0–100.0)
Monocytes Absolute: 0.9 K/uL (ref 0.1–1.0)
Monocytes Relative: 8 %
Neutro Abs: 7.3 K/uL (ref 1.7–7.7)
Neutrophils Relative %: 66 %
Platelets: 274 K/uL (ref 150–400)
RBC: 5.19 MIL/uL (ref 4.22–5.81)
RDW: 13.9 % (ref 11.5–15.5)
WBC: 11.2 K/uL — ABNORMAL HIGH (ref 4.0–10.5)
nRBC: 0 % (ref 0.0–0.2)

## 2024-09-29 MED ORDER — POTASSIUM CHLORIDE CRYS ER 20 MEQ PO TBCR
40.0000 meq | EXTENDED_RELEASE_TABLET | Freq: Every day | ORAL | Status: DC
  Administered 2024-09-29 – 2024-09-30 (×2): 40 meq via ORAL
  Filled 2024-09-29 (×2): qty 2

## 2024-09-29 MED ORDER — POTASSIUM CHLORIDE CRYS ER 20 MEQ PO TBCR: 40.0000 meq | EXTENDED_RELEASE_TABLET | Freq: Once | ORAL | Status: DC

## 2024-09-29 MED ORDER — TRAMADOL HCL 50 MG PO TABS
50.0000 mg | ORAL_TABLET | Freq: Once | ORAL | Status: AC
  Administered 2024-09-29: 50 mg via ORAL
  Filled 2024-09-29: qty 1

## 2024-09-29 NOTE — Progress Notes (Signed)
 PROGRESS NOTE    [name]  FMW:[id number] DOB: [birth year] DOA: 09/27/2024 PCP: Vonita Quince, PA   Brief Narrative:  Henry Chandler is a 43 y.o. 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  Assessment & Plan:   Principal Problem:   Acute on chronic HFrEF (heart failure with reduced ejection fraction) (HCC) Active Problems:   Uncontrolled hypertension   Acute hypoxemic respiratory failure (HCC)   Elevated troponin   OSA (obstructive sleep apnea)  Acute hypoxemic respiratory failure secondary to acute on chronic HFrEF - Presenting with 52-month history of worsening dyspnea and orthopnea - Notable profound dietary noncompliance - Recent medication changes with reinitiation of Lasix  with minimal improvement - CTA negative for PE notable for edema - Continue supplemental oxygen, wean as appropriate, currently on room air at rest - Cardiology following, appreciate insight recommendations current plan for 24 hours of ongoing aggressive diuresis with potential discharge in the next 24 to 48 hours pending clinical course - Echo 09/28/24 - EF 20-25% with global hypokinesis and severe dilation from of the left ventricle; noted grade 2 diastolic dysfunction -Discussed volume intake management salt management and dietary restriction was   Uncontrolled hypertension - Currently controlled on carvedilol , furosemide , irbesartan , isosorbide /hydralazine , spironolactone  - Continue diuretics as appropriate   Mild troponin elevation -Likely demand ischemia in the setting of above, not consistent with ACS, EKG without notable ST elevations    OSA Previously diagnosed not currently on CPAP, recommend repeat sleep apnea testing in the outpatient  setting Discussed weight  loss and dietary restrictions as above   History of asthma/COPD? Patient reports history of cigarette smoking/vaping and it seems he never had pulmonary function testing done.  He was given DuoNeb x 3 and prednisone  60 mg in the ED.  No wheezing appreciated on exam at this time.  Suspect his dyspnea is mostly due to decompensated heart failure/pulmonary edema.  Continue to monitor closely and continue bronchodilators/steroids if needed.   Hyperlipidemia Continue Lipitor.  DVT prophylaxis: lovenox  Code Status:   Code Status: Full Code Family Communication: None present  Status is: Inpatient  Dispo: The patient is from: Home              Anticipated d/c is to: Home              Anticipated d/c date is: 24 hours              Patient currently not medically stable for discharge  Consultants:  Cardiology  Procedures:  None  Antimicrobials:  None indicated  Subjective: No acute issues or events overnight, respiratory status markedly improving but not yet back to baseline  Objective: Vitals:   09/28/24 0607 09/28/24 1045 09/28/24 1952 09/29/24 0532  BP: 138/82 121/75 (!) 103/58 (!) 144/103  Pulse: 89 69 72 75  Resp: 18 20    Temp: 98.2 F (36.8 C) 98.4 F (36.9 C) 98.5 F (36.9 C) 98.1 F (36.7 C)  TempSrc: Oral Oral Oral Oral  SpO2: 97% 95% 97% 94%  Weight:      Height:        Intake/Output Summary (Last 24 hours) at 09/29/2024 0723 Last data filed at 09/29/2024 0536 Gross per 24 hour  Intake 800 ml  Output 1800 ml  Net -1000 ml   Filed Weights   09/27/24 1620  09/28/24 0244  Weight: (!) 170.1 kg (!) 168.7 kg    Examination:  General:  Pleasantly resting in bed, No acute distress. HEENT:  Normocephalic atraumatic.  Sclerae nonicteric, noninjected.  Extraocular movements intact bilaterally. Neck:  Without mass or deformity.  Trachea is midline. Lungs: Scant bibasilar rales. Heart:  Regular rate and rhythm.  Without murmurs, rubs, or  gallops. Abdomen:  Soft, nontender, nondistended.  Without guarding or rebound. Extremities: Without cyanosis, clubbing, noted for scant lower extremity edema, +1 distally Skin:  Warm and dry, no erythema.  Data Reviewed: I have personally reviewed following labs and imaging studies  CBC: Recent Labs  Lab 09/27/24 1740 09/28/24 0407 09/29/24 0359  WBC 12.1* 12.0* 11.2*  NEUTROABS  --   --  7.3  HGB 14.3 15.1 14.5  HCT 45.8 49.6 46.6  MCV 87.6 88.3 89.8  PLT 282 321 274   Basic Metabolic Panel: Recent Labs  Lab 09/27/24 1740 09/27/24 2134 09/28/24 0407 09/29/24 0359  NA 136  --  138 137  K 4.1  --  3.9 3.9  CL 104  --  101 102  CO2 22  --  25 24  GLUCOSE 83  --  133* 122*  BUN 18  --  17 19  CREATININE 1.04  --  1.26* 1.23  CALCIUM  9.0  --  9.9 9.3  MG  --  2.1  --   --    GFR: Estimated Creatinine Clearance: 129 mL/min (by C-G formula based on SCr of 1.23 mg/dL). Liver Function Tests: Recent Labs  Lab 09/28/24 0407  AST 24  ALT 41  ALKPHOS 91  BILITOT 0.5  PROT 7.8  ALBUMIN 4.3   No results for input(s): LIPASE, AMYLASE in the last 168 hours. No results for input(s): AMMONIA in the last 168 hours. Coagulation Profile: No results for input(s): INR, PROTIME in the last 168 hours. Cardiac Enzymes: No results for input(s): CKTOTAL, CKMB, CKMBINDEX, TROPONINI in the last 168 hours. BNP (last 3 results) Recent Labs    09/27/24 1740  PROBNP 409.0*   HbA1C: No results for input(s): HGBA1C in the last 72 hours. CBG: No results for input(s): GLUCAP in the last 168 hours. Lipid Profile: No results for input(s): CHOL, HDL, LDLCALC, TRIG, CHOLHDL, LDLDIRECT in the last 72 hours. Thyroid Function Tests: No results for input(s): TSH, T4TOTAL, FREET4, T3FREE, THYROIDAB in the last 72 hours. Anemia Panel: No results for input(s): VITAMINB12, FOLATE, FERRITIN, TIBC, IRON, RETICCTPCT in the last 72  hours. Sepsis Labs: No results for input(s): PROCALCITON, LATICACIDVEN in the last 168 hours.  Recent Results (from the past 240 hours)  Resp panel by RT-PCR (RSV, Flu A&B, Covid) Anterior Nasal Swab     Status: None   Collection Time: 09/27/24  6:45 PM   Specimen: Anterior Nasal Swab  Result Value Ref Range Status   SARS Coronavirus 2 by RT PCR NEGATIVE NEGATIVE Final    Comment: (NOTE) SARS-CoV-2 target nucleic acids are NOT DETECTED.  The SARS-CoV-2 RNA is generally detectable in upper respiratory specimens during the acute phase of infection. The lowest concentration of SARS-CoV-2 viral copies this assay can detect is 138 copies/mL. A negative result does not preclude SARS-Cov-2 infection and should not be used as the sole basis for treatment or other patient management decisions. A negative result may occur with  improper specimen collection/handling, submission of specimen other than nasopharyngeal swab, presence of viral mutation(s) within the areas targeted by this assay, and inadequate number of viral copies(<138  copies/mL). A negative result must be combined with clinical observations, patient history, and epidemiological information. The expected result is Negative.  Fact Sheet for Patients:  BloggerCourse.com  Fact Sheet for Healthcare Providers:  SeriousBroker.it  This test is no t yet approved or cleared by the [place]  FDA and  has been authorized for detection and/or diagnosis of SARS-CoV-2 by FDA under an Emergency Use Authorization (EUA). This EUA will remain  in effect (meaning this test can be used) for the duration of the COVID-19 declaration under Section 564(b)(1) of the Act, 21 U.S.C.section 360bbb-3(b)(1), unless the authorization is terminated  or revoked sooner.       Influenza A by PCR NEGATIVE NEGATIVE Final   Influenza B by PCR NEGATIVE NEGATIVE Final    Comment: (NOTE) The Xpert  Xpress SARS-CoV-2/FLU/RSV plus assay is intended as an aid in the diagnosis of influenza from Nasopharyngeal swab specimens and should not be used as a sole basis for treatment. Nasal washings and aspirates are unacceptable for Xpert Xpress SARS-CoV-2/FLU/RSV testing.  Fact Sheet for Patients: BloggerCourse.com  Fact Sheet for Healthcare Providers: SeriousBroker.it  This test is not yet approved or cleared by the [place]  FDA and has been authorized for detection and/or diagnosis of SARS-CoV-2 by FDA under an Emergency Use Authorization (EUA). This EUA will remain in effect (meaning this test can be used) for the duration of the COVID-19 declaration under Section 564(b)(1) of the Act, 21 U.S.C. section 360bbb-3(b)(1), unless the authorization is terminated or revoked.     Resp Syncytial Virus by PCR NEGATIVE NEGATIVE Final    Comment: (NOTE) Fact Sheet for Patients: BloggerCourse.com  Fact Sheet for Healthcare Providers: SeriousBroker.it  This test is not yet approved or cleared by the [place]  FDA and has been authorized for detection and/or diagnosis of SARS-CoV-2 by FDA under an Emergency Use Authorization (EUA). This EUA will remain in effect (meaning this test can be used) for the duration of the COVID-19 declaration under Section 564(b)(1) of the Act, 21 U.S.C. section 360bbb-3(b)(1), unless the authorization is terminated or revoked.  Performed at [facility], [address] [place], [place] [address]          Radiology Studies: ECHOCARDIOGRAM COMPLETE Result Date: 09/28/2024    ECHOCARDIOGRAM REPORT   Patient Name:   [facility] Date of Exam: 09/28/2024 Medical Rec #:  [id number]            Height:       73.0 in Accession #:    [id number]           Weight:       372.0 lb Date of Birth:  03/10/1981            BSA:           2.801 m Patient Age:    42 years             BP:           138/82 mmHg Patient Gender: M                    HR:           83 bpm. Exam Location:  Inpatient Procedure: 2D Echo, Cardiac Doppler, Color Doppler and Intracardiac            Opacification Agent (Both Spectral and Color Flow Doppler were            utilized during procedure). Indications:  CHF  History:        Patient has prior history of Echocardiogram examinations, most                 recent 07/04/2023. CHF; Risk Factors:Hypertension and Sleep                 Apnea.  Sonographer:    Philomena Daring Referring Phys: [id number] TAYLOR A PARCELLS  Sonographer Comments: Patient is obese. IMPRESSIONS  1. No LV thrombus on contrast imaging. Left ventricular ejection fraction, by estimation, is 20 to 25%. The left ventricle has severely decreased function. The left ventricle demonstrates global hypokinesis. The left ventricular internal cavity size was  severely dilated. There is mild concentric left ventricular hypertrophy. Left ventricular diastolic parameters are consistent with Grade II diastolic dysfunction (pseudonormalization).  2. Right ventricular systolic function is normal. The right ventricular size is normal. Tricuspid regurgitation signal is inadequate for assessing PA pressure.  3. The mitral valve is grossly normal. Trivial mitral valve regurgitation. No evidence of mitral stenosis.  4. The aortic valve is tricuspid. Aortic valve regurgitation is not visualized. No aortic stenosis is present.  5. The inferior vena cava is normal in size with greater than 50% respiratory variability, suggesting right atrial pressure of 3 mmHg. Comparison(s): Changes from prior study are noted. The left ventricular function is worsened. FINDINGS  Left Ventricle: No LV thrombus on contrast imaging. Left ventricular ejection fraction, by estimation, is 20 to 25%. The left ventricle has severely decreased function. The left ventricle demonstrates global hypokinesis.  Definity contrast agent was given IV to delineate the left ventricular endocardial borders. The left ventricular internal cavity size was severely dilated. There is mild concentric left ventricular hypertrophy. Left ventricular diastolic parameters are consistent with Grade II diastolic dysfunction (pseudonormalization). Right Ventricle: The right ventricular size is normal. No increase in right ventricular wall thickness. Right ventricular systolic function is normal. Tricuspid regurgitation signal is inadequate for assessing PA pressure. Left Atrium: Left atrial size was normal in size. Right Atrium: Right atrial size was normal in size. Pericardium: Trivial pericardial effusion is present. Mitral Valve: The mitral valve is grossly normal. Trivial mitral valve regurgitation. No evidence of mitral valve stenosis. Tricuspid Valve: The tricuspid valve is grossly normal. Tricuspid valve regurgitation is trivial. No evidence of tricuspid stenosis. Aortic Valve: The aortic valve is tricuspid. Aortic valve regurgitation is not visualized. No aortic stenosis is present. Pulmonic Valve: The pulmonic valve was grossly normal. Pulmonic valve regurgitation is not visualized. No evidence of pulmonic stenosis. Aorta: The aortic root and ascending aorta are structurally normal, with no evidence of dilitation. Venous: The inferior vena cava is normal in size with greater than 50% respiratory variability, suggesting right atrial pressure of 3 mmHg. IAS/Shunts: The atrial septum is grossly normal.  LEFT VENTRICLE PLAX 2D LVIDd:         7.10 cm   Diastology LVIDs:         5.90 cm   LV e' medial:    5.66 cm/s LV PW:         1.30 cm   LV E/e' medial:  17.5 LV IVS:        1.30 cm   LV e' lateral:   9.14 cm/s LVOT diam:     2.30 cm   LV E/e' lateral: 10.8 LV SV:         75 LV SV Index:   27 LVOT Area:     4.15 cm  RIGHT VENTRICLE  IVC RV S prime:     19.30 cm/s  IVC diam: 1.90 cm TAPSE (M-mode): 3.0 cm LEFT ATRIUM              Index        RIGHT ATRIUM           Index LA diam:        4.20 cm 1.50 cm/m   RA Area:     19.80 cm LA Vol (A2C):   56.3 ml 20.10 ml/m  RA Volume:   55.80 ml  19.92 ml/m LA Vol (A4C):   60.3 ml 21.53 ml/m LA Biplane Vol: 60.6 ml 21.64 ml/m  AORTIC VALVE LVOT Vmax:   95.50 cm/s LVOT Vmean:  63.000 cm/s LVOT VTI:    0.180 m  AORTA Ao Root diam: 3.10 cm Ao Asc diam:  3.20 cm MITRAL VALVE MV Area (PHT): 4.49 cm    SHUNTS MV Decel Time: 169 msec    Systemic VTI:  0.18 m MV E velocity: 99.00 cm/s  Systemic Diam: 2.30 cm MV A velocity: 54.80 cm/s MV E/A ratio:  1.81 Darryle Decent MD Electronically signed by Darryle Decent MD Signature Date/Time: 09/28/2024/11:13:56 AM    Final    CT Angio Chest PE W/Cm &/Or Wo Cm Result Date: 09/27/2024 EXAM: CTA of the Chest without and with contrast for PE 09/27/2024 09:15:58 PM TECHNIQUE: CTA of the chest was performed without and with the administration of 75 mL iohexol  (OMNIPAQUE ) 350 MG/ML injection. Multiplanar reformatted images are provided for review. MIP images are provided for review. Automated exposure control, iterative reconstruction, and/or weight based adjustment of the mA/kV was utilized to reduce the radiation dose to as low as reasonably achievable. COMPARISON: CT chest dated 07/21/2018. CLINICAL HISTORY: Pulmonary embolism (PE) suspected, high prob; Pulmonary embolism (PE) suspected, low to intermediate prob, positive D-dimer. Patient said when he walks a lot he gets winded and has a cough. Feels like he has a lot of drainage in his throat. Has a sore throat more to the right side. Burns to swallow. FINDINGS: PULMONARY ARTERIES: Pulmonary arteries are adequately opacified for evaluation. No pulmonary embolism. Main pulmonary artery is normal in caliber. MEDIASTINUM: Heart is moderately enlarged. The pericardium demonstrates no acute abnormality. There is no acute abnormality of the thoracic aorta. LYMPH NODES: There is an enlarged paratracheal lymph node  measuring 14 mm short axis. Subcarinal lymph node measures 15 mm short axis. No axillary lymphadenopathy. LUNGS AND PLEURA: There are mild patchy ground-glass opacities with smooth interlobular septal thickening in the mid and inferior lungs. There are minimal ground-glass opacities patchy in the upper lobes. No focal consolidation or pulmonary edema. There is no pleural effusion. There is no pneumothorax. UPPER ABDOMEN: Limited images of the upper abdomen are unremarkable. SOFT TISSUES AND BONES: No acute bone or soft tissue abnormality. IMPRESSION: 1. No evidence of pulmonary embolism. 2. Mild pulmonary edema pattern with ground-glass opacities and smooth interlobular septal thickening, greatest in the mid to lower lungs. 3. Mediastinal lymphadenopathy (enlarged paratracheal and subcarinal lymph nodes). Electronically signed by: Greig Pique MD 09/27/2024 09:35 PM EDT RP Workstation: [id number]   DG Chest 2 View Result Date: 09/27/2024 CLINICAL DATA:  Shortness of breath EXAM: CHEST - 2 VIEW COMPARISON:  05/10/2022 FINDINGS: Cardiomegaly. No confluent opacity, effusions or edema. No acute bony abnormality. IMPRESSION: Cardiomegaly.  No active disease. Electronically Signed   By: Franky Crease M.D.   On: 09/27/2024 17:28        Scheduled Meds:  atorvastatin   40 mg Oral q1800   carvedilol   25 mg Oral BID WC   empagliflozin   10 mg Oral Daily   enoxaparin  (LOVENOX ) injection  80 mg Subcutaneous Q24H   furosemide   40 mg Intravenous BID   irbesartan   300 mg Oral Daily   isosorbide -hydrALAZINE   0.5 tablet Oral TID   spironolactone   25 mg Oral Daily   Continuous Infusions:   LOS: 1 day   Time spent:  Elsie JAYSON Montclair, DO [facility]  If 7PM-7AM, please contact night-coverage [email or web address]  09/29/2024, 7:23 AM

## 2024-09-29 NOTE — Progress Notes (Addendum)
 Progress Note  Patient Name: Henry Chandler Date of Encounter: 09/29/2024 [facility] Cardiologist: Oneil Parchment, MD   Interval Summary   Feeling good, no acute complaints Still reports mild bloating of abdomen  Vital Signs Vitals:   09/28/24 0607 09/28/24 1045 09/28/24 1952 09/29/24 0532  BP: 138/82 121/75 (!) 103/58 (!) 144/103  Pulse: 89 69 72 75  Resp: 18 20    Temp: 98.2 F (36.8 C) 98.4 F (36.9 C) 98.5 F (36.9 C) 98.1 F (36.7 C)  TempSrc: Oral Oral Oral Oral  SpO2: 97% 95% 97% 94%  Weight:      Height:        Intake/Output Summary (Last 24 hours) at 09/29/2024 0955 Last data filed at 09/29/2024 0536 Gross per 24 hour  Intake 680 ml  Output 1800 ml  Net -1120 ml      09/28/2024    2:44 AM 09/27/2024    4:20 PM 09/17/2024    9:20 AM  Last 3 Weights  Weight (lbs) 372 lb 375 lb 376 lb 9.6 oz  Weight (kg) 168.738 kg 170.099 kg 170.825 kg     Telemetry/ECG  Sinus rhythm, frequent PVCs - Personally Reviewed  Physical Exam  GEN: No acute distress.   Neck: No JVD Cardiac: RRR, no murmurs, rubs, or gallops.  Respiratory: Clear to auscultation bilaterally. GI: Soft, nontender, non-distended  MS: No edema  Assessment & Plan   Acute on chronic HFrEF Nonischemic cardiomyopathy Uncontrolled hypertension   Home meds: Lasix  20 mg daily, irbesartan  300 mg daily, BiDil 20-37.5 mg half tablet TID, carvedilol  25 mg BID, spironolactone  25 mg daily, Jardiance  10 mg daily proBNP elevated at 409 CTPA showed mild pulmonary edema Urine output of 1.8 L yesterday, net 2.5 L out Weight 372 lb today Noted to be euvolemic at 343 lb on 06/2023 Patient reports signifincant improvement in symptoms from yesterday  Ambulating around room without difficulty  Identifies trigger as increased volume intake, increased salt intake (frequent fast food consumption due to working 2 jobs) Echo from this admission: LVEF 20-25%, global hypokinesis, LVH, G2DD, normal RV  systolic, no significant valvular abnormalities, normal IVC Currently on IV Lasix  40 mg BID, got dose this AM will discuss with MD in terms of switching to PO Lasix  today  Continue carvedilol  25 mg BID Continue Jardiance  10 mg daily Continue irbesartan  300 mg daily Continue half tablet of BiDil 20-37.5 mg TID Continue spironolactone  25 mg daily Continue daily weights, daily BMPs, strict I's and O's   Per primary OSA not on CPAP  Hyperlipidemia  Asthma   For questions or updates, please contact [facility] Please consult [email or web address] for contact info under         Signed, Waddell DELENA Donath, PA-C    I have personally evaluated and examined the patient. The history, physical exam, and medical decision making documented below were performed independently and substantively by me. I have reviewed all relevant data, formulated the assessment and plan, and assumed responsibility for the management of this patient. My documentation reflects the substantive portion of the split/shared visit, in accordance with CMS and CPT guidelines. I have updated the NPP documentation above as appropriate.  I have personally performed the substantive portion of the medical decision making, including interpretation of diagnostic data, formulation of the management plan, and assessment of risks. In summation:  Patient notes that he is doing much better.  Wife notes that he is still coughing and short of breath. No CP.  Mother  notes that they are working to make a change.  Exam notable for  Gen: no distress, morbid obesity  Neck: No JVD supine, no carotid bruit Cardiac: No Rubs or Gallops, no Murmur, RRR +2radial pulses Respiratory: Clear to auscultation bilaterally, normal effort, normal  respiratory rate GI: Soft, nontender, distended but improved MS: No no edema;  moves all extremities Integument: Skin feels warm Neuro:  At time of evaluation, alert and oriented to person/place/time/situation   Psych: Normal affect, patient feels ok  Personally reviewed data and interpretation:   Tele: SR with PVC and NSVT Additional tests:  In assessment and plan:   Acute on chronic heart failure with reduced ejection fraction (HFrEF) and volume overload - NYHA II, Hypervolemic but improving, Stage C, HTN suspect as etiology - will discuss with Dr. Lue; if he is in agreement one more day of IV diuretics; tomorrow BNP and lasix  40 mg PO daily - narrow complex rhythm, discussed outpatient secondary prevention DC ICD and outpatient CPET - NSVT burden has improved may need PO K at DC - continue GDMT - he has paperwork for his AHF team; we will  arrange f/u with his team in November  Premature ventricular contractions (PVCs) and nonsustained ventricular tachycardia in the setting of HFrEF - Ensure potassium levels are maintained above 4.0, continue current BB needs standing K; no sustained VT ok to shower    Stanly Leavens, MD FASE [facility] Cardiologist [facility]  [address] [place], [address] [place], [place] [address] [phone]  12:28 PM

## 2024-09-29 NOTE — Plan of Care (Signed)
  Problem: Education: Goal: Knowledge of General Education information will improve Description: Including pain rating scale, medication(s)/side effects and non-pharmacologic comfort measures Outcome: Progressing   Problem: Health Behavior/Discharge Planning: Goal: Ability to manage health-related needs will improve Outcome: Progressing   Problem: Clinical Measurements: Goal: Will remain free from infection Outcome: Progressing Goal: Respiratory complications will improve Outcome: Progressing   Problem: Activity: Goal: Risk for activity intolerance will decrease Outcome: Progressing   Problem: Nutrition: Goal: Adequate nutrition will be maintained Outcome: Progressing   Problem: Elimination: Goal: Will not experience complications related to bowel motility Outcome: Progressing Goal: Will not experience complications related to urinary retention Outcome: Progressing   Problem: Pain Managment: Goal: General experience of comfort will improve and/or be controlled Outcome: Progressing   Problem: Safety: Goal: Ability to remain free from injury will improve Outcome: Progressing   Problem: Skin Integrity: Goal: Risk for impaired skin integrity will decrease Outcome: Progressing

## 2024-09-30 ENCOUNTER — Other Ambulatory Visit (HOSPITAL_COMMUNITY): Payer: Self-pay

## 2024-09-30 DIAGNOSIS — I5023 Acute on chronic systolic (congestive) heart failure: Secondary | ICD-10-CM | POA: Diagnosis not present

## 2024-09-30 LAB — PRO BRAIN NATRIURETIC PEPTIDE: Pro Brain Natriuretic Peptide: 222 pg/mL (ref <300.0)

## 2024-09-30 LAB — BASIC METABOLIC PANEL WITH GFR
Anion gap: 12 (ref 5–15)
BUN: 20 mg/dL (ref 6–20)
CO2: 24 mmol/L (ref 22–32)
Calcium: 9.6 mg/dL (ref 8.9–10.3)
Chloride: 101 mmol/L (ref 98–111)
Creatinine, Ser: 1.25 mg/dL — ABNORMAL HIGH (ref 0.61–1.24)
GFR, Estimated: >60 mL/min (ref >60)
Glucose, Bld: 163 mg/dL — ABNORMAL HIGH (ref 70–99)
Potassium: 4.1 mmol/L (ref 3.5–5.1)
Sodium: 136 mmol/L (ref 135–145)

## 2024-09-30 MED ORDER — FUROSEMIDE 40 MG PO TABS
40.0000 mg | ORAL_TABLET | Freq: Every day | ORAL | 1 refills | Status: DC
  Filled 2024-09-30: qty 30, 30d supply, fill #0

## 2024-09-30 MED ORDER — FUROSEMIDE 40 MG PO TABS: 40.0000 mg | ORAL_TABLET | Freq: Every day | ORAL | Status: DC

## 2024-09-30 MED ORDER — NICOTINE 14 MG/24HR TD PT24
14.0000 mg | MEDICATED_PATCH | Freq: Every day | TRANSDERMAL | 0 refills | Status: AC | PRN
  Filled 2024-09-30: qty 28, 28d supply, fill #0

## 2024-09-30 MED ORDER — POTASSIUM CHLORIDE CRYS ER 20 MEQ PO TBCR
40.0000 meq | EXTENDED_RELEASE_TABLET | Freq: Every day | ORAL | 1 refills | Status: DC
  Filled 2024-09-30: qty 30, 15d supply, fill #0

## 2024-09-30 NOTE — Progress Notes (Signed)
 Progress Note  Patient Name: Henry Chandler Date of Encounter: 09/30/2024 Primary Cardiologist: Oneil Parchment, MD   Subjective   Overnight no events.No CP, SOB, Palpitations.  Vital Signs    Vitals:   09/29/24 1926 09/30/24 0232 09/30/24 0500 09/30/24 0816  BP: (!) 103/58 126/89  (!) 151/84  Pulse: 75 81  74  Resp: 19 16    Temp: 98.2 F (36.8 C) 98 F (36.7 C)    TempSrc: Oral Oral    SpO2: 97% 99%    Weight:   (!) 168.1 kg   Height:        Intake/Output Summary (Last 24 hours) at 09/30/2024 0823 Last data filed at 09/30/2024 9178 Gross per 24 hour  Intake 1080 ml  Output 3700 ml  Net -2620 ml   Filed Weights   09/27/24 1620 09/28/24 0244 09/30/24 0500  Weight: (!) 170.1 kg (!) 168.7 kg (!) 168.1 kg    Physical Exam   GEN: No acute distress.  Morbid obesity Neck: No JVD Cardiac: RRR, no murmurs, rubs, or gallops.  Respiratory: Clear to auscultation bilaterally. GI: Soft, nontender, non-distended  MS: No edema  Labs   Telemetry: SR with PVCs no NSVT; one run labelled NSVT is artifact   Chemistry Recent Labs  Lab 09/27/24 1740 09/28/24 0407 09/29/24 0359  NA 136 138 137  K 4.1 3.9 3.9  CL 104 101 102  CO2 22 25 24   GLUCOSE 83 133* 122*  BUN 18 17 19   CREATININE 1.04 1.26* 1.23  CALCIUM  9.0 9.9 9.3  PROT  --  7.8  --   ALBUMIN  --  4.3  --   AST  --  24  --   ALT  --  41  --   ALKPHOS  --  91  --   BILITOT  --  0.5  --   GFRNONAA >60 >60 >60  ANIONGAP 9 12 11      Hematology Recent Labs  Lab 09/27/24 1740 09/28/24 0407 09/29/24 0359  WBC 12.1* 12.0* 11.2*  RBC 5.23 5.62 5.19  HGB 14.3 15.1 14.5  HCT 45.8 49.6 46.6  MCV 87.6 88.3 89.8  MCH 27.3 26.9 27.9  MCHC 31.2 30.4 31.1  RDW 13.6 13.7 13.9  PLT 282 321 274     BNP Recent Labs  Lab 09/27/24 1740 09/30/24 0351  PROBNP 409.0* 222.0     DDimer  Recent Labs  Lab 09/27/24 1740  DDIMER 0.58*     Cardiac Studies   Cardiac Studies & Procedures    ______________________________________________________________________________________________ CARDIAC CATHETERIZATION  CARDIAC CATHETERIZATION 05/14/2022  Conclusion CONCLUSIONS: Chronic systolic heart failure with elevated LVEDP of 28 mmHg., Nonischemic cardiomyopathy with compensated volume status given pulmonary wedge mean pressure of 13 mmHg. Right dominant normal coronary arteries. Normal pulmonary artery pressure.  RECOMMENDATIONS:  Four pillar therapy for systolic heart failure as tolerated by blood pressure. Blood pressure control Cessation of alcohol intake Weight loss Evaluation for sleep apnea  Findings Coronary Findings Diagnostic  Dominance: Right  No diagnostic findings have been documented. Intervention  No interventions have been documented.     ECHOCARDIOGRAM  ECHOCARDIOGRAM COMPLETE 09/28/2024  Narrative ECHOCARDIOGRAM REPORT    Patient Name:   [facility] Date of Exam: 09/28/2024 Medical Rec #:  [id number]            Height:       73.0 in Accession #:    [id number]           Weight:  372.0 lb Date of Birth:  August 02, 1981            BSA:          2.801 m Patient Age:    43 years             BP:           138/82 mmHg Patient Gender: M                    HR:           83 bpm. Exam Location:  Inpatient  Procedure: 2D Echo, Cardiac Doppler, Color Doppler and Intracardiac Opacification Agent (Both Spectral and Color Flow Doppler were utilized during procedure).  Indications:    CHF  History:        Patient has prior history of Echocardiogram examinations, most recent 07/04/2023. CHF; Risk Factors:Hypertension and Sleep Apnea.  Sonographer:    Philomena Daring Referring Phys: [id number] TAYLOR A PARCELLS   Sonographer Comments: Patient is obese. IMPRESSIONS   1. No LV thrombus on contrast imaging. Left ventricular ejection fraction, by estimation, is 20 to 25%. The left ventricle has severely decreased function. The left ventricle  demonstrates global hypokinesis. The left ventricular internal cavity size was severely dilated. There is mild concentric left ventricular hypertrophy. Left ventricular diastolic parameters are consistent with Grade II diastolic dysfunction (pseudonormalization). 2. Right ventricular systolic function is normal. The right ventricular size is normal. Tricuspid regurgitation signal is inadequate for assessing PA pressure. 3. The mitral valve is grossly normal. Trivial mitral valve regurgitation. No evidence of mitral stenosis. 4. The aortic valve is tricuspid. Aortic valve regurgitation is not visualized. No aortic stenosis is present. 5. The inferior vena cava is normal in size with greater than 50% respiratory variability, suggesting right atrial pressure of 3 mmHg.  Comparison(s): Changes from prior study are noted. The left ventricular function is worsened.  FINDINGS Left Ventricle: No LV thrombus on contrast imaging. Left ventricular ejection fraction, by estimation, is 20 to 25%. The left ventricle has severely decreased function. The left ventricle demonstrates global hypokinesis. Definity contrast agent was given IV to delineate the left ventricular endocardial borders. The left ventricular internal cavity size was severely dilated. There is mild concentric left ventricular hypertrophy. Left ventricular diastolic parameters are consistent with Grade II diastolic dysfunction (pseudonormalization).  Right Ventricle: The right ventricular size is normal. No increase in right ventricular wall thickness. Right ventricular systolic function is normal. Tricuspid regurgitation signal is inadequate for assessing PA pressure.  Left Atrium: Left atrial size was normal in size.  Right Atrium: Right atrial size was normal in size.  Pericardium: Trivial pericardial effusion is present.  Mitral Valve: The mitral valve is grossly normal. Trivial mitral valve regurgitation. No evidence of mitral valve  stenosis.  Tricuspid Valve: The tricuspid valve is grossly normal. Tricuspid valve regurgitation is trivial. No evidence of tricuspid stenosis.  Aortic Valve: The aortic valve is tricuspid. Aortic valve regurgitation is not visualized. No aortic stenosis is present.  Pulmonic Valve: The pulmonic valve was grossly normal. Pulmonic valve regurgitation is not visualized. No evidence of pulmonic stenosis.  Aorta: The aortic root and ascending aorta are structurally normal, with no evidence of dilitation.  Venous: The inferior vena cava is normal in size with greater than 50% respiratory variability, suggesting right atrial pressure of 3 mmHg.  IAS/Shunts: The atrial septum is grossly normal.   LEFT VENTRICLE PLAX 2D LVIDd:         7.10  cm   Diastology LVIDs:         5.90 cm   LV e' medial:    5.66 cm/s LV PW:         1.30 cm   LV E/e' medial:  17.5 LV IVS:        1.30 cm   LV e' lateral:   9.14 cm/s LVOT diam:     2.30 cm   LV E/e' lateral: 10.8 LV SV:         75 LV SV Index:   27 LVOT Area:     4.15 cm   RIGHT VENTRICLE             IVC RV S prime:     19.30 cm/s  IVC diam: 1.90 cm TAPSE (M-mode): 3.0 cm  LEFT ATRIUM             Index        RIGHT ATRIUM           Index LA diam:        4.20 cm 1.50 cm/m   RA Area:     19.80 cm LA Vol (A2C):   56.3 ml 20.10 ml/m  RA Volume:   55.80 ml  19.92 ml/m LA Vol (A4C):   60.3 ml 21.53 ml/m LA Biplane Vol: 60.6 ml 21.64 ml/m AORTIC VALVE LVOT Vmax:   95.50 cm/s LVOT Vmean:  63.000 cm/s LVOT VTI:    0.180 m  AORTA Ao Root diam: 3.10 cm Ao Asc diam:  3.20 cm  MITRAL VALVE MV Area (PHT): 4.49 cm    SHUNTS MV Decel Time: 169 msec    Systemic VTI:  0.18 m MV E velocity: 99.00 cm/s  Systemic Diam: 2.30 cm MV A velocity: 54.80 cm/s MV E/A ratio:  1.81  Darryle Decent MD Electronically signed by Darryle Decent MD Signature Date/Time: 09/28/2024/11:13:56 AM    Final        CARDIAC MRI  MR CARDIAC MORPHOLOGY W WO  CONTRAST 07/31/2022  Narrative CLINICAL DATA:  Cardiomyopathy  EXAM: CARDIAC MRI  TECHNIQUE: The patient was scanned on a 1.5 Tesla GE magnet. A dedicated cardiac coil was used. Functional imaging was done using Fiesta sequences. 2,3, and 4 chamber views were done to assess for RWMA's. Modified Simpson's rule using a short axis stack was used to calculate an ejection fraction on a dedicated work [employer]. The patient received 17 cc of Gadavist . After 10 minutes inversion recovery sequences were used to assess for infiltration and scar tissue.  CONTRAST:  Gadavist   FINDINGS: Mild LAE. Normal RA/RV. No pericardial effusion. No ASD/VSD. Normal ascending thoracic aorta 3.3 cm Mild MV thickening with trivial appearing MR. Normal AV, TV and PV. LV is severely enlarged with  Global hypokinesis Quantitative EF 26% (EDV 413 cc ESV 306 cc SV 106 cc) Trabeculated non compacted myocardium in anterior, lateral and apical walls. Diastolic ratio of crypts to myocardium 13.1 mm/ 6 mm which is > 2 consistent with non compaction No thrombus noted normal RV size and low normal function Quantitative RVEF 42% (EDV 203 cc ESV 118 cc SV 85 cc )  Normal parametric measures T2 global 53 msec T1 1045 msec ECV 26%  IMPRESSION: 1. Severe LVE with global hypokinesis EF 26%  2. 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  No significant delayed gadolinium uptake  4.  Normal parametric measures see above  5.  Mild LAE  6.  Trivial appearing MR  Maude Emmer   Electronically Signed By: Maude Emmer M.D. On: 07/31/2022 17:31   ______________________________________________________________________________________________           Assessment & Plan   Acute on chronic heart failure with reduced ejection fraction (HFrEF) and volume overload - NYHA II, Euvolemic, -5 L this admission, Stage C, HTN  suspect as etiology - BNP normalized; continue home GDMT recommend lasix  40 mg PO Daily (tomorrow start) and K 40 meq PO daily at DC  narrow complex rhythm, discussed outpatient secondary prevention DC ICD and outpatient CPET - he has paperwork for his AHF team; he has f/u this October - discuss sodium and preservative restrictions    Premature ventricular contractions (PVCs) and nonsustained ventricular tachycardia in the setting of HFrEF - Ensure potassium levels are maintained above 4.0 and outpatient supplementation     For questions or updates, please contact [facility] Please consult [email or web address] for contact info under Cardiology/STEMI.      Stanly Leavens, MD FASE [facility] Cardiologist [facility]  [address] [place], [address] [place], [place] [address] [phone]  8:23 AM

## 2024-09-30 NOTE — Discharge Summary (Signed)
 Physician Discharge Summary  Henry Chandler FMW:[id number] DOB: June 15, 1981 DOA: 09/27/2024  PCP: Vonita Quince, PA  Admit date: 09/27/2024 Discharge date: 09/30/2024  Admitted From: Home Disposition:  Home  Recommendations for Outpatient Follow-up:  Follow up with PCP in 1-2 weeks Follow up with cardiology as scheduled Continue to follow strict diet as discussed  Home Health:None  Equipment/Devices:None  Discharge Condition:Stable  CODE STATUS:Full  Diet recommendation: Low salt low fat fluid restricted diet   Brief/Interim Summary: Henry Chandler is a 43 y.o. 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  Patient admitted as above for worsening shortness of breath in the setting of acute heart failure exacerbation.  Patient hypoxia improved quite rapidly, respiratory status continues to improve daily with diuresis.  Appreciate cardiology insight and recommendations.  Patient's hypertension is currently moderately well-controlled on multiple medications as below, given patient's reduced ejection fraction core measures were initiated per cardiology including carvedilol , furosemide , irbesartan , spironolactone  -patient will need to follow-up outpatient for further insight and recommendations as he may be candidate for further treatment and procedure.  Patient carries a diagnosis of sleep apnea, diagnosed December 2023.  At that time patient was reported to have follow-up appointment on 10/31 2025 for repeat evaluation.  At some point in the interim he has been lost to follow-up and no longer has CPAP.  We discussed that he will need reevaluation with formal sleep study in the outpatient setting for insurance to approve and/or pay for his machine  -he has an appointment for repeat sleep study next week per discussion with family.  At this time given patient's improvement he is otherwise stable and agreeable for discharge home.  Lengthy discussion in regards to need for medication and lifestyle compliance as well as to further discuss dietary restrictions and weight loss to improve outcomes.  Discharge Diagnoses:  Principal Problem:   Acute on chronic HFrEF (heart failure with reduced ejection fraction) (HCC) Active Problems:   Uncontrolled hypertension   Acute hypoxemic respiratory failure (HCC)   Elevated troponin   OSA (obstructive sleep apnea)  Acute hypoxemic respiratory failure secondary to acute on chronic HFrEF - Presenting with 29-month history of worsening dyspnea and orthopnea - Notable profound dietary noncompliance - Recent medication changes with reinitiation of Lasix  with minimal improvement - CTA negative for PE notable for edema - Continue supplemental oxygen, wean as appropriate, currently on room air at rest - Cardiology following, appreciate insight recommendations -continue diuretics and new medication regimen as below - Echo 09/28/24 - EF 20-25% with global hypokinesis and severe dilation from of the left ventricle; noted grade 2 diastolic dysfunction -Discussed volume intake management salt management and dietary restriction was   Uncontrolled hypertension - Currently controlled on carvedilol , furosemide , irbesartan , isosorbide /hydralazine , spironolactone  - Continue diuretics as appropriate   Mild troponin elevation -Likely demand ischemia in the setting of above, not consistent with ACS, EKG without notable ST elevations    OSA Previously diagnosed but not currently on CPAP Repeat sleep apnea testing in the outpatient setting planned for 10/29 Discussed weight  loss and dietary restrictions as above   History of asthma/COPD? Patient reports history of cigarette smoking/vaping and it seems he never had  pulmonary function testing done.  He was given DuoNeb x 3 and prednisone  60 mg in the ED.  No wheezing appreciated on exam  at this time.  Suspect his dyspnea is mostly due to decompensated heart failure/pulmonary edema.  Continue to monitor closely and continue bronchodilators/steroids if needed.   Hyperlipidemia Continue Lipitor.  Discharge Instructions  Discharge Instructions     (HEART FAILURE PATIENTS) Call MD:  Anytime you have any of the following symptoms: 1) 3 pound weight gain in 24 hours or 5 pounds in 1 week 2) shortness of breath, with or without a dry hacking cough 3) swelling in the hands, feet or stomach 4) if you have to sleep on extra pillows at night in order to breathe.   Complete by: As directed    Call MD for:  difficulty breathing, headache or visual disturbances   Complete by: As directed    Call MD for:  extreme fatigue   Complete by: As directed    Call MD for:  hives   Complete by: As directed    Call MD for:  persistant dizziness or light-headedness   Complete by: As directed    Call MD for:  persistant nausea and vomiting   Complete by: As directed    Call MD for:  severe uncontrolled pain   Complete by: As directed    Call MD for:  temperature >100.4   Complete by: As directed    Diet - low sodium heart healthy   Complete by: As directed    Fluid restriction per day unless otherwise specified by cardiology   Increase activity slowly   Complete by: As directed       Allergies as of 09/30/2024       Reactions   Lisinopril  Anaphylaxis, Swelling   Swelling of tongue        Medication List     STOP taking these medications    hydroxypropyl methylcellulose / hypromellose 2.5 % ophthalmic solution Commonly known as: ISOPTO TEARS / GONIOVISC       TAKE these medications    acetaminophen  325 MG tablet Commonly known as: TYLENOL  Take 650 mg by mouth every 6 (six) hours as needed for mild pain.   Airsupra 90-80 MCG/ACT Aero Generic  drug: Albuterol -Budesonide Inhale 2 puffs into the lungs every 4 (four) hours as needed.   albuterol  108 (90 Base) MCG/ACT inhaler Commonly known as: VENTOLIN  HFA Inhale 2 puffs into the lungs every 6 (six) hours as needed.   atorvastatin  40 MG tablet Commonly known as: LIPITOR Take 1 tablet (40 mg total) by mouth daily at 6 PM.   carvedilol  25 MG tablet Commonly known as: COREG  Take 1 tablet (25 mg total) by mouth 2 (two) times daily with a meal.   empagliflozin  10 MG Tabs tablet Commonly known as: JARDIANCE  Take 1 tablet (10 mg total) by mouth daily.   furosemide  40 MG tablet Commonly known as: LASIX  Take 1 tablet (40 mg total) by mouth daily. Start taking on: October 01, 2024 What changed:  medication strength how much to take   irbesartan  300 MG tablet Commonly known as: AVAPRO  Take 1 tablet (300 mg total) by mouth daily.   isosorbide -hydrALAZINE  20-37.5 MG tablet Commonly known as: BIDIL Take 1/2 tablet three times daily   nicotine 14 mg/24hr patch Commonly known as: NICODERM CQ - dosed in mg/24 hours Place 1 patch (14 mg total) onto the skin daily as needed (tobacco dependence).   potassium chloride  SA 20 MEQ tablet Commonly known as: KLOR-CON  M Take 2 tablets (40 mEq total) by mouth daily.   spironolactone  25 MG tablet Commonly known as:  Aldactone  Take 1 tablet (25 mg total) by mouth daily.   valACYclovir  1000 MG tablet Commonly known as: VALTREX  Take 1 tablet (1,000 mg total) by mouth 3 (three) times daily. What changed:  when to take this reasons to take this        Allergies  Allergen Reactions   Lisinopril  Anaphylaxis and Swelling    Swelling of tongue    Consultations: Cardiology  Procedures/Studies: ECHOCARDIOGRAM COMPLETE Result Date: 09/28/2024    ECHOCARDIOGRAM REPORT   Patient Name:   [facility] Date of Exam: 09/28/2024 Medical Rec #:  [id number]            Height:       73.0 in Accession #:    [id number]            Weight:       372.0 lb Date of Birth:  10/26/1981            BSA:          2.801 m Patient Age:    42 years             BP:           138/82 mmHg Patient Gender: M                    HR:           83 bpm. Exam Location:  Inpatient Procedure: 2D Echo, Cardiac Doppler, Color Doppler and Intracardiac            Opacification Agent (Both Spectral and Color Flow Doppler were            utilized during procedure). Indications:    CHF  History:        Patient has prior history of Echocardiogram examinations, most                 recent 07/04/2023. CHF; Risk Factors:Hypertension and Sleep                 Apnea.  Sonographer:    Philomena Daring Referring Phys: [id number] TAYLOR A PARCELLS  Sonographer Comments: Patient is obese. IMPRESSIONS  1. No LV thrombus on contrast imaging. Left ventricular ejection fraction, by estimation, is 20 to 25%. The left ventricle has severely decreased function. The left ventricle demonstrates global hypokinesis. The left ventricular internal cavity size was  severely dilated. There is mild concentric left ventricular hypertrophy. Left ventricular diastolic parameters are consistent with Grade II diastolic dysfunction (pseudonormalization).  2. Right ventricular systolic function is normal. The right ventricular size is normal. Tricuspid regurgitation signal is inadequate for assessing PA pressure.  3. The mitral valve is grossly normal. Trivial mitral valve regurgitation. No evidence of mitral stenosis.  4. The aortic valve is tricuspid. Aortic valve regurgitation is not visualized. No aortic stenosis is present.  5. The inferior vena cava is normal in size with greater than 50% respiratory variability, suggesting right atrial pressure of 3 mmHg. Comparison(s): Changes from prior study are noted. The left ventricular function is worsened. FINDINGS  Left Ventricle: No LV thrombus on contrast imaging. Left ventricular ejection fraction, by estimation, is 20 to 25%. The left ventricle has severely  decreased function. The left ventricle demonstrates global hypokinesis. Definity contrast agent was given IV to delineate the left ventricular endocardial borders. The left ventricular internal cavity size was severely dilated. There is mild concentric left ventricular hypertrophy. Left ventricular diastolic parameters are consistent with Grade II diastolic dysfunction (pseudonormalization).  Right Ventricle: The right ventricular size is normal. No increase in right ventricular wall thickness. Right ventricular systolic function is normal. Tricuspid regurgitation signal is inadequate for assessing PA pressure. Left Atrium: Left atrial size was normal in size. Right Atrium: Right atrial size was normal in size. Pericardium: Trivial pericardial effusion is present. Mitral Valve: The mitral valve is grossly normal. Trivial mitral valve regurgitation. No evidence of mitral valve stenosis. Tricuspid Valve: The tricuspid valve is grossly normal. Tricuspid valve regurgitation is trivial. No evidence of tricuspid stenosis. Aortic Valve: The aortic valve is tricuspid. Aortic valve regurgitation is not visualized. No aortic stenosis is present. Pulmonic Valve: The pulmonic valve was grossly normal. Pulmonic valve regurgitation is not visualized. No evidence of pulmonic stenosis. Aorta: The aortic root and ascending aorta are structurally normal, with no evidence of dilitation. Venous: The inferior vena cava is normal in size with greater than 50% respiratory variability, suggesting right atrial pressure of 3 mmHg. IAS/Shunts: The atrial septum is grossly normal.  LEFT VENTRICLE PLAX 2D LVIDd:         7.10 cm   Diastology LVIDs:         5.90 cm   LV e' medial:    5.66 cm/s LV PW:         1.30 cm   LV E/e' medial:  17.5 LV IVS:        1.30 cm   LV e' lateral:   9.14 cm/s LVOT diam:     2.30 cm   LV E/e' lateral: 10.8 LV SV:         75 LV SV Index:   27 LVOT Area:     4.15 cm  RIGHT VENTRICLE             IVC RV S prime:      19.30 cm/s  IVC diam: 1.90 cm TAPSE (M-mode): 3.0 cm LEFT ATRIUM             Index        RIGHT ATRIUM           Index LA diam:        4.20 cm 1.50 cm/m   RA Area:     19.80 cm LA Vol (A2C):   56.3 ml 20.10 ml/m  RA Volume:   55.80 ml  19.92 ml/m LA Vol (A4C):   60.3 ml 21.53 ml/m LA Biplane Vol: 60.6 ml 21.64 ml/m  AORTIC VALVE LVOT Vmax:   95.50 cm/s LVOT Vmean:  63.000 cm/s LVOT VTI:    0.180 m  AORTA Ao Root diam: 3.10 cm Ao Asc diam:  3.20 cm MITRAL VALVE MV Area (PHT): 4.49 cm    SHUNTS MV Decel Time: 169 msec    Systemic VTI:  0.18 m MV E velocity: 99.00 cm/s  Systemic Diam: 2.30 cm MV A velocity: 54.80 cm/s MV E/A ratio:  1.81 Darryle Decent MD Electronically signed by Darryle Decent MD Signature Date/Time: 09/28/2024/11:13:56 AM    Final    CT Angio Chest PE W/Cm &/Or Wo Cm Result Date: 09/27/2024 EXAM: CTA of the Chest without and with contrast for PE 09/27/2024 09:15:58 PM TECHNIQUE: CTA of the chest was performed without and with the administration of 75 mL iohexol  (OMNIPAQUE ) 350 MG/ML injection. Multiplanar reformatted images are provided for review. MIP images are provided for review. Automated exposure control, iterative reconstruction, and/or weight based adjustment of the mA/kV was utilized to reduce the radiation dose to as low as reasonably achievable. COMPARISON: CT  chest dated 07/21/2018. CLINICAL HISTORY: Pulmonary embolism (PE) suspected, high prob; Pulmonary embolism (PE) suspected, low to intermediate prob, positive D-dimer. Patient said when he walks a lot he gets winded and has a cough. Feels like he has a lot of drainage in his throat. Has a sore throat more to the right side. Burns to swallow. FINDINGS: PULMONARY ARTERIES: Pulmonary arteries are adequately opacified for evaluation. No pulmonary embolism. Main pulmonary artery is normal in caliber. MEDIASTINUM: Heart is moderately enlarged. The pericardium demonstrates no acute abnormality. There is no acute abnormality of the  thoracic aorta. LYMPH NODES: There is an enlarged paratracheal lymph node measuring 14 mm short axis. Subcarinal lymph node measures 15 mm short axis. No axillary lymphadenopathy. LUNGS AND PLEURA: There are mild patchy ground-glass opacities with smooth interlobular septal thickening in the mid and inferior lungs. There are minimal ground-glass opacities patchy in the upper lobes. No focal consolidation or pulmonary edema. There is no pleural effusion. There is no pneumothorax. UPPER ABDOMEN: Limited images of the upper abdomen are unremarkable. SOFT TISSUES AND BONES: No acute bone or soft tissue abnormality. IMPRESSION: 1. No evidence of pulmonary embolism. 2. Mild pulmonary edema pattern with ground-glass opacities and smooth interlobular septal thickening, greatest in the mid to lower lungs. 3. Mediastinal lymphadenopathy (enlarged paratracheal and subcarinal lymph nodes). Electronically signed by: Greig Pique MD 09/27/2024 09:35 PM EDT RP Workstation: [id number]   DG Chest 2 View Result Date: 09/27/2024 CLINICAL DATA:  Shortness of breath EXAM: CHEST - 2 VIEW COMPARISON:  05/10/2022 FINDINGS: Cardiomegaly. No confluent opacity, effusions or edema. No acute bony abnormality. IMPRESSION: Cardiomegaly.  No active disease. Electronically Signed   By: Franky Crease M.D.   On: 09/27/2024 17:28     Subjective: No acute issues or events overnight   Discharge Exam: Vitals:   09/30/24 0822 09/30/24 1052  BP:    Pulse:    Resp: (!) 22   Temp:    SpO2:  94%   Vitals:   09/30/24 0500 09/30/24 0816 09/30/24 0822 09/30/24 1052  BP:  (!) 151/84    Pulse:  74    Resp:   (!) 22   Temp:      TempSrc:      SpO2:    94%  Weight: (!) 168.1 kg     Height:        General: Pt is alert, awake, not in acute distress Cardiovascular: RRR, S1/S2 +, no rubs, no gallops Respiratory: CTA bilaterally, no wheezing, no rhonchi Abdominal: Soft, NT, ND, bowel sounds + Extremities: Scant bilateral lower extremity  edema, no cyanosis or clubbing    The results of significant diagnostics from this hospitalization (including imaging, microbiology, ancillary and laboratory) are listed below for reference.     Microbiology: Recent Results (from the past 240 hours)  Resp panel by RT-PCR (RSV, Flu A&B, Covid) Anterior Nasal Swab     Status: None   Collection Time: 09/27/24  6:45 PM   Specimen: Anterior Nasal Swab  Result Value Ref Range Status   SARS Coronavirus 2 by RT PCR NEGATIVE NEGATIVE Final    Comment: (NOTE) SARS-CoV-2 target nucleic acids are NOT DETECTED.  The SARS-CoV-2 RNA is generally detectable in upper respiratory specimens during the acute phase of infection. The lowest concentration of SARS-CoV-2 viral copies this assay can detect is 138 copies/mL. A negative result does not preclude SARS-Cov-2 infection and should not be used as the sole basis for treatment or other patient management decisions. A negative result  may occur with  improper specimen collection/handling, submission of specimen other than nasopharyngeal swab, presence of viral mutation(s) within the areas targeted by this assay, and inadequate number of viral copies(<138 copies/mL). A negative result must be combined with clinical observations, patient history, and epidemiological information. The expected result is Negative.  Fact Sheet for Patients:  BloggerCourse.com  Fact Sheet for Healthcare Providers:  SeriousBroker.it  This test is no t yet approved or cleared by the [place]  FDA and  has been authorized for detection and/or diagnosis of SARS-CoV-2 by FDA under an Emergency Use Authorization (EUA). This EUA will remain  in effect (meaning this test can be used) for the duration of the COVID-19 declaration under Section 564(b)(1) of the Act, 21 U.S.C.section 360bbb-3(b)(1), unless the authorization is terminated  or revoked sooner.       Influenza A  by PCR NEGATIVE NEGATIVE Final   Influenza B by PCR NEGATIVE NEGATIVE Final    Comment: (NOTE) The Xpert Xpress SARS-CoV-2/FLU/RSV plus assay is intended as an aid in the diagnosis of influenza from Nasopharyngeal swab specimens and should not be used as a sole basis for treatment. Nasal washings and aspirates are unacceptable for Xpert Xpress SARS-CoV-2/FLU/RSV testing.  Fact Sheet for Patients: BloggerCourse.com  Fact Sheet for Healthcare Providers: SeriousBroker.it  This test is not yet approved or cleared by the [place]  FDA and has been authorized for detection and/or diagnosis of SARS-CoV-2 by FDA under an Emergency Use Authorization (EUA). This EUA will remain in effect (meaning this test can be used) for the duration of the COVID-19 declaration under Section 564(b)(1) of the Act, 21 U.S.C. section 360bbb-3(b)(1), unless the authorization is terminated or revoked.     Resp Syncytial Virus by PCR NEGATIVE NEGATIVE Final    Comment: (NOTE) Fact Sheet for Patients: BloggerCourse.com  Fact Sheet for Healthcare Providers: SeriousBroker.it  This test is not yet approved or cleared by the [place]  FDA and has been authorized for detection and/or diagnosis of SARS-CoV-2 by FDA under an Emergency Use Authorization (EUA). This EUA will remain in effect (meaning this test can be used) for the duration of the COVID-19 declaration under Section 564(b)(1) of the Act, 21 U.S.C. section 360bbb-3(b)(1), unless the authorization is terminated or revoked.  Performed at [facility], [address] [place], [place] [address]      Labs: BNP (last 3 results) Recent Labs    09/17/24 1040  BNP 424.1*   Basic Metabolic Panel: Recent Labs  Lab 09/27/24 1740 09/27/24 2134 09/28/24 0407 09/29/24 0359 09/30/24 0840  NA 136  --  138 137 136  K 4.1  --   3.9 3.9 4.1  CL 104  --  101 102 101  CO2 22  --  25 24 24   GLUCOSE 83  --  133* 122* 163*  BUN 18  --  17 19 20   CREATININE 1.04  --  1.26* 1.23 1.25*  CALCIUM  9.0  --  9.9 9.3 9.6  MG  --  2.1  --   --   --    Liver Function Tests: Recent Labs  Lab 09/28/24 0407  AST 24  ALT 41  ALKPHOS 91  BILITOT 0.5  PROT 7.8  ALBUMIN 4.3   No results for input(s): LIPASE, AMYLASE in the last 168 hours. No results for input(s): AMMONIA in the last 168 hours. CBC: Recent Labs  Lab 09/27/24 1740 09/28/24 0407 09/29/24 0359  WBC 12.1* 12.0* 11.2*  NEUTROABS  --   --  7.3  HGB 14.3 15.1 14.5  HCT 45.8 49.6 46.6  MCV 87.6 88.3 89.8  PLT 282 321 274   Cardiac Enzymes: No results for input(s): CKTOTAL, CKMB, CKMBINDEX, TROPONINI in the last 168 hours. BNP: Invalid input(s): POCBNP CBG: No results for input(s): GLUCAP in the last 168 hours. D-Dimer Recent Labs    09/27/24 1740  DDIMER 0.58*   Hgb A1c No results for input(s): HGBA1C in the last 72 hours. Lipid Profile No results for input(s): CHOL, HDL, LDLCALC, TRIG, CHOLHDL, LDLDIRECT in the last 72 hours. Thyroid function studies No results for input(s): TSH, T4TOTAL, T3FREE, THYROIDAB in the last 72 hours.  Invalid input(s): FREET3 Anemia work up No results for input(s): VITAMINB12, FOLATE, FERRITIN, TIBC, IRON, RETICCTPCT in the last 72 hours. Urinalysis    Component Value Date/Time   COLORURINE YELLOW 08/08/2017 0923   APPEARANCEUR Clear 04/20/2018 1027   LABSPEC 1.015 08/08/2017 0923   PHURINE 6.5 08/08/2017 0923   GLUCOSEU Negative 04/20/2018 1027   HGBUR TRACE (A) 08/08/2017 0923   BILIRUBINUR Negative 04/20/2018 1027   KETONESUR NEGATIVE 08/08/2017 0923   PROTEINUR Negative 04/20/2018 1027   PROTEINUR NEGATIVE 08/08/2017 0923   UROBILINOGEN 0.2 04/30/2017 0907   NITRITE Negative 04/20/2018 1027   NITRITE NEGATIVE 08/08/2017 0923   LEUKOCYTESUR Negative  04/20/2018 1027   Sepsis Labs Recent Labs  Lab 09/27/24 1740 09/28/24 0407 09/29/24 0359  WBC 12.1* 12.0* 11.2*   Microbiology Recent Results (from the past 240 hours)  Resp panel by RT-PCR (RSV, Flu A&B, Covid) Anterior Nasal Swab     Status: None   Collection Time: 09/27/24  6:45 PM   Specimen: Anterior Nasal Swab  Result Value Ref Range Status   SARS Coronavirus 2 by RT PCR NEGATIVE NEGATIVE Final    Comment: (NOTE) SARS-CoV-2 target nucleic acids are NOT DETECTED.  The SARS-CoV-2 RNA is generally detectable in upper respiratory specimens during the acute phase of infection. The lowest concentration of SARS-CoV-2 viral copies this assay can detect is 138 copies/mL. A negative result does not preclude SARS-Cov-2 infection and should not be used as the sole basis for treatment or other patient management decisions. A negative result may occur with  improper specimen collection/handling, submission of specimen other than nasopharyngeal swab, presence of viral mutation(s) within the areas targeted by this assay, and inadequate number of viral copies(<138 copies/mL). A negative result must be combined with clinical observations, patient history, and epidemiological information. The expected result is Negative.  Fact Sheet for Patients:  BloggerCourse.com  Fact Sheet for Healthcare Providers:  SeriousBroker.it  This test is no t yet approved or cleared by the [place]  FDA and  has been authorized for detection and/or diagnosis of SARS-CoV-2 by FDA under an Emergency Use Authorization (EUA). This EUA will remain  in effect (meaning this test can be used) for the duration of the COVID-19 declaration under Section 564(b)(1) of the Act, 21 U.S.C.section 360bbb-3(b)(1), unless the authorization is terminated  or revoked sooner.       Influenza A by PCR NEGATIVE NEGATIVE Final   Influenza B by PCR NEGATIVE NEGATIVE Final     Comment: (NOTE) The Xpert Xpress SARS-CoV-2/FLU/RSV plus assay is intended as an aid in the diagnosis of influenza from Nasopharyngeal swab specimens and should not be used as a sole basis for treatment. Nasal washings and aspirates are unacceptable for Xpert Xpress SARS-CoV-2/FLU/RSV testing.  Fact Sheet for Patients: BloggerCourse.com  Fact Sheet for Healthcare Providers: SeriousBroker.it  This test is not  yet approved or cleared by the [place]  FDA and has been authorized for detection and/or diagnosis of SARS-CoV-2 by FDA under an Emergency Use Authorization (EUA). This EUA will remain in effect (meaning this test can be used) for the duration of the COVID-19 declaration under Section 564(b)(1) of the Act, 21 U.S.C. section 360bbb-3(b)(1), unless the authorization is terminated or revoked.     Resp Syncytial Virus by PCR NEGATIVE NEGATIVE Final    Comment: (NOTE) Fact Sheet for Patients: BloggerCourse.com  Fact Sheet for Healthcare Providers: SeriousBroker.it  This test is not yet approved or cleared by the [place]  FDA and has been authorized for detection and/or diagnosis of SARS-CoV-2 by FDA under an Emergency Use Authorization (EUA). This EUA will remain in effect (meaning this test can be used) for the duration of the COVID-19 declaration under Section 564(b)(1) of the Act, 21 U.S.C. section 360bbb-3(b)(1), unless the authorization is terminated or revoked.  Performed at [facility], [address] [place], [place] [address]      Time coordinating discharge: Over 30 minutes  SIGNED:   Elsie JAYSON Montclair, DO [facility] 09/30/2024, 3:28 PM Pager   If 7PM-7AM, please contact night-coverage [email or web address]

## 2024-09-30 NOTE — Progress Notes (Signed)
 Discharge medications delivered to the patient at the bedside.

## 2024-09-30 NOTE — Progress Notes (Signed)
 09/30/2024 10:04 AM -----------------------------------------------------------[facility]--------------------------------------------------- D(Data) A(Action) R(response)     Data: Discharge Readiness Assessment EDD today 09/30/2024    Action: Chart reviewed    Response: No immediate Barriers to discharge identified at this time.     Griselda Bramblett, RN [facility] [employer] [facility]

## 2024-09-30 NOTE — Progress Notes (Signed)
 Discharge instructions given to patient questions asked and answered.

## 2024-10-14 ENCOUNTER — Telehealth (HOSPITAL_COMMUNITY): Payer: Self-pay

## 2024-10-14 NOTE — Telephone Encounter (Signed)
 Called to confirm/remind patient of their appointment at the [facility] on 10/15/24.   Appointment:   [] Confirmed  [x] Left mess   [] No answer/No voice mail  [] VM Full/unable to leave message  [] Phone not in service  And to bring in all medications and/or complete list.

## 2024-10-15 ENCOUNTER — Ambulatory Visit (HOSPITAL_COMMUNITY)
Admission: RE | Admit: 2024-10-15 | Discharge: 2024-10-15 | Disposition: A | Discharge status: Home / Self Care | Source: Ambulatory Visit | Attending: Physician Assistant | Admitting: Physician Assistant

## 2024-10-15 ENCOUNTER — Telehealth (HOSPITAL_COMMUNITY): Payer: Self-pay

## 2024-10-15 ENCOUNTER — Ambulatory Visit (HOSPITAL_COMMUNITY): Admission: RE | Admit: 2024-10-15 | Source: Ambulatory Visit

## 2024-10-15 VITALS — BP 108/68 | HR 83 | Wt 379.2 lb

## 2024-10-15 DIAGNOSIS — I5020 Unspecified systolic (congestive) heart failure: Secondary | ICD-10-CM

## 2024-10-15 DIAGNOSIS — G4733 Obstructive sleep apnea (adult) (pediatric): Secondary | ICD-10-CM

## 2024-10-15 DIAGNOSIS — I10 Essential (primary) hypertension: Secondary | ICD-10-CM | POA: Diagnosis not present

## 2024-10-15 MED ORDER — FUROSEMIDE 40 MG PO TABS: 80.0000 mg | ORAL_TABLET | Freq: Every day | ORAL | 5 refills | Status: DC

## 2024-10-15 MED ORDER — POTASSIUM CHLORIDE CRYS ER 20 MEQ PO TBCR: 40.0000 meq | EXTENDED_RELEASE_TABLET | Freq: Two times a day (BID) | ORAL | 3 refills | Status: DC

## 2024-10-15 NOTE — Patient Instructions (Signed)
 Medication Changes:  INCREASE FUROSEMIDE  TO 80MG  ONCE DAILY   INCREASE POTASSIUM TO TWICE DAILY   Lab Work:  LABS AS SCHEDULED IN 1 WEEK   Referrals:  YOU HAVE BEEN REFERRED TO PHARMACY CLINIC THEY WILL REACH OUT TO YOU OR CALL TO ARRANGE THIS. PLEASE CALL US  WITH ANY CONCERNS   Follow-Up in: 2-3 WEEKS AS SCHEDULED WITH MD   At the [facility], you and your health needs are our priority. We have a designated team specialized in the treatment of Heart Failure. This Care Team includes your primary Heart Failure Specialized Cardiologist (physician), Advanced Practice Providers (APPs- Physician Assistants and Nurse Practitioners), and Pharmacist who all work together to provide you with the care you need, when you need it.   You may see any of the following providers on your designated Care Team at your next follow up:  Dr. Toribio Fuel Dr. Ezra Shuck Dr. Odis Brownie Greig Mosses, NP Caffie Shed, [place] [facility] [place], [place] Beckey Coe, NP Jordan Lee, NP Tinnie Redman, PharmD   Please be sure to bring in all your medications bottles to every appointment.   Need to Contact Us :  If you have any questions or concerns before your next appointment please send us  a message through [place] or call our office at [phone].    TO LEAVE A MESSAGE FOR THE NURSE SELECT OPTION 2, PLEASE LEAVE A MESSAGE INCLUDING: YOUR NAME DATE OF BIRTH CALL BACK NUMBER REASON FOR CALL**this is important as we prioritize the call backs  YOU WILL RECEIVE A CALL BACK THE SAME DAY AS LONG AS YOU CALL BEFORE 4:00 PM

## 2024-10-15 NOTE — Progress Notes (Addendum)
 ADVANCED HEART FAILURE CLINIC NOTE   Primary Care: Vonita Quince, [place] Primary Cardiologist: Dr. Jeffrie HF Cardiologist: Previously Dr. Gardenia  HPI: Henry Chandler is a 43 y.o. male with a history of uncontrolled hypertension, heart failure with severely reduced EF, hyperlipidemia, history of COVID-pneumonia complicated by respiratory failure, morbid obesity.  He has had uncontrolled HTN since [age] years old. He has been on and off anti-hypertensives since then. In 2023, he was off his medications for some time. During a PCP visit, he started having chest pain & SOB; he admitted to [facility] for hypertensive urgency. He subsequently had an echocardiogram in May 2023 with LVEF of 25-30%.   Echo 7/24 showed EF 30-35%, RV normal (EF 35-40% on Dr. Gardenia read).  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 He was referred to [facility].    Last seen on 09/17/24, had been lost to follow-up for over a year. He was volume overloaded and restarted lasix . Bidil also added back.   Readmitted a couple of weeks later with acute on chronic CHF. Had been eating a lot of salt and drinking more fluid. He was hypertensive. Echo with EF 20-25%, grade II DD, RV okay. He was diuresed with IV lasix . Runs of NSVT and PVCs noted on telemetry. Discharge weight 168 kg.   Interval hx:  He is here today for CHF follow-up. Reports shortness of breath with exertion. Also endorses cough with exertion or lying down. No PND or lower extremity edema. Weight tends to average between 374-376 lb. Taking all medications as prescribed. He increased his Lasix  to 80 mg daily a couple of days ago and has noticed slightly improvement in his symptoms. He has been trying to eat better.   Currently working 2 jobs, out on leave from one job that requires 12 hrs of strenuous labor. Denies ETOH and tobacco use. Lives at home with his wife.    Past Medical History:  Diagnosis Date   Back pain     Habitual alcohol use    HFrEF (heart failure with reduced ejection fraction) (HCC) 05/14/2022   Echocardiogram 04/2022: EF 20-25, RVSP 33.9, mild MR, effusion Cardiac catheterization 04/2022: no CAD    Hypertension    Morbid obesity (HCC)    Pneumonia due to COVID-19 virus 05/2019   Snoring    Suspected sleep apnea    Tobacco abuse    Current Outpatient Medications  Medication Sig Dispense Refill   acetaminophen  (TYLENOL ) 325 MG tablet Take 650 mg by mouth every 6 (six) hours as needed for mild pain.     AIRSUPRA 90-80 MCG/ACT AERO Inhale 2 puffs into the lungs every 4 (four) hours as needed.     albuterol  (VENTOLIN  HFA) 108 (90 Base) MCG/ACT inhaler Inhale 2 puffs into the lungs every 6 (six) hours as needed.     atorvastatin  (LIPITOR) 40 MG tablet Take 1 tablet (40 mg total) by mouth daily at 6 PM. 90 tablet 3   carvedilol  (COREG ) 25 MG tablet Take 1 tablet (25 mg total) by mouth 2 (two) times daily with a meal. 60 tablet 11   empagliflozin  (JARDIANCE ) 10 MG TABS tablet Take 1 tablet (10 mg total) by mouth daily. 90 tablet 3   irbesartan  (AVAPRO ) 300 MG tablet Take 1 tablet (300 mg total) by mouth daily. 30 tablet 1   isosorbide -hydrALAZINE  (BIDIL) 20-37.5 MG tablet Take 1/2 tablet three times daily 45 tablet 3   nicotine (NICODERM CQ - DOSED IN MG/24 HOURS) 14 mg/24hr  patch Place 1 patch (14 mg total) onto the skin daily as needed (tobacco dependence). 28 patch 0   spironolactone  (ALDACTONE ) 25 MG tablet Take 1 tablet (25 mg total) by mouth daily. 90 tablet 3   valACYclovir  (VALTREX ) 1000 MG tablet Take 1 tablet (1,000 mg total) by mouth 3 (three) times daily. 21 tablet 0   furosemide  (LASIX ) 40 MG tablet Take 2 tablets (80 mg total) by mouth daily. 60 tablet 5   potassium chloride  SA (KLOR-CON  M) 20 MEQ tablet Take 2 tablets (40 mEq total) by mouth 2 (two) times daily. 120 tablet 3   No current facility-administered medications for this encounter.    Allergies  Allergen Reactions    Lisinopril  Anaphylaxis and Swelling    Swelling of tongue      Social History   Socioeconomic History   Marital status: Married    Spouse name: Lameco   Number of children: 0   Years of education: GED   Highest education level: Not on file  Occupational History   Occupation: Occupational Psychologist: [employer]  Tobacco Use   Smoking status: Former    Current packs/day: 0.25    Average packs/day: 0.3 packs/day for 17.0 years (4.3 ttl pk-yrs)    Types: Cigarettes   Smokeless tobacco: Never   Tobacco comments:    less than .5 PPD  Vaping Use   Vaping status: Never Used  Substance and Sexual Activity   Alcohol use: Yes    Alcohol/week: 10.0 - 15.0 standard drinks of alcohol    Types: 10 - 15 Shots of liquor per week   Drug use: Not Currently    Types: Marijuana   Sexual activity: Yes    Birth control/protection: Pill  Other Topics Concern   Not on file  Social History Narrative   Regular exercise-yes   Caffeine Use-yes   Social Drivers of Health   Financial Resource Strain: Low Risk  (01/02/2024)   [facility]   Overall Financial Resource Strain (CARDIA)    Difficulty of Paying Living Expenses: Not hard at all  Recent Concern: Financial Resource Strain - Medium Risk (12/01/2023)   Received from [facility]   Overall Financial Resource Strain (CARDIA)    Difficulty of Paying Living Expenses: Somewhat hard  Food Insecurity: No Food Insecurity (09/28/2024)   Hunger Vital Sign    Worried About Running Out of Food in the Last Year: Never true    Ran Out of Food in the Last Year: Never true  Transportation Needs: No Transportation Needs (09/28/2024)   PRAPARE - Administrator, Civil Service (Medical): No    Lack of Transportation (Non-Medical): No  Physical Activity: Sufficiently Active (01/02/2024)   Received from [facility]   Exercise Vital Sign    On average, how many days per week do you engage in moderate to strenuous exercise  (like a brisk walk)?: 3 days    On average, how many minutes do you engage in exercise at this level?: 60 min  Recent Concern: Physical Activity - Insufficiently Active (12/01/2023)   Received [facility]   Exercise Vital Sign    On average, how many days per week do you engage in moderate to strenuous exercise (like a brisk walk)?: 2 days    On average, how many minutes do you engage in exercise at this level?: 60 min  Stress: No Stress Concern Present (01/02/2024)   Received from [facility]  Institute of Occupational Health - Occupational Stress Questionnaire    Feeling of Stress : Only a little  Recent Concern: Stress - Stress Concern Present (12/01/2023)   Received from [facility] - Occupational Stress Questionnaire    Feeling of Stress : To some extent  Social Connections: Socially Integrated (01/02/2024)   Received from [facility]   Social Network    How would you rate your social network (family, work, friends)?: Good participation with social networks  Intimate Partner Violence: Not At Risk (09/28/2024)   Humiliation, Afraid, Rape, and Kick questionnaire    Fear of Current or Ex-Partner: No    Emotionally Abused: No    Physically Abused: No    Sexually Abused: No    Family History  Problem Relation Age of Onset   Hypertension Mother    Cancer Mother        pancreas   Hyperlipidemia Mother    Heart disease Mother        ? stent? unclear details   Hypertension Father    Hyperlipidemia Father    Heart disease Sister        Had PNA -> had to have heart transplant   Diabetes Neg Hx    Stroke Neg Hx    Wt Readings from Last 3 Encounters:  10/15/24 (!) 172 kg (379 lb 3.2 oz)  09/30/24 (!) 168.1 kg (370 lb 9.6 oz)  09/17/24 (!) 170.8 kg (376 lb 9.6 oz)   BP 108/68   Pulse 83   Wt (!) 172 kg (379 lb 3.2 oz)   SpO2 93%   BMI 50.03 kg/m   PHYSICAL EXAM: General: Ambulated into clinic. No  distress. Neck: JVP difficult but appears elevated to midneck. Cor: Regular rate & rhythm. No murmurs. Lungs: clear Abdomen: morbidly obese, nondistended. Extremities: no edema Neuro: alert & orientedx3. Affect pleasant    DATA REVIEW  ECG: 04/11/23: NSR   09/17/24: NSR 89 bpm  ECHO: 10/25: EF 20-25%, LVIDd 7.1 cm, RV okay 7/24: EF 35-40% (per Dr. Gardenia) 04/11/23: LVEF 30-35%, normal RV function  05/10/22: LVEF 20-25%, normal RV function.   CATH: 05/14/22: Chronic systolic heart failure with elevated LVEDP of 28 mmHg., Nonischemic cardiomyopathy with compensated volume status given pulmonary wedge mean pressure of 13 mmHg. Right dominant normal coronary arteries. Normal pulmonary artery pressure.   CMR: 07/2022: 1. Severe LVE with global hypokinesis EF 26%  2. 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  No significant delayed gadolinium uptake  4.  Normal parametric measures see above  5.  Mild LAE  6.  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  His sister required cardiac transplant a few years ago. His parents also have heart disease however he does not know the details. 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 Has appointment to see Genetic Counselor, Dr. Fairy - Refer to [facility], we discussed this today. NYHA class / AHA Stage: Worse, NYHA III Volume status & Diuretics: Exam difficult d/t body habitus but appears overloaded. Increase lasix  to 80 mg daily. Increase K supp to 40 mEq BID. Vasodilators: Continue Avapro  30 0mg  daily; restart BiDil 0.5 tab tid. Beta-Blocker: Continue Coreg  25 mg bid  MRA: Continue  spironolactone  25 mg daily Cardiometabolic: Continue Jardiance  10 mg daily Advanced therapies: Not currently indicated, may eventually need. Worry about his current trajectory. - Scr 1.25 and K 4.1 on labs 10/16. Repeat labs in 1 week  2.  HTN - CT abdomen from August 2019 does not demonstrate any significant renal artery stenosis or calcification. - BP now much better controlled - Meds as above - Needs CPAP  3. Morbid Obesity - Body mass index is 50.03 kg/m. - Needs weight loss - Refer to Pharmacy to see if we can get GLP-1 RA approved for him  4. Lisinopril  angioedema  - Reports 5-6 years ago having respiratory distress, throat swelling that required steroids for improvement.   5. OSA - not on CPAP - Has been referred to Pulmonary  Will sign FMLA paperwork for his 2nd job. Unable to perform duties due to HF limitation.  Follow up 3 weeks to establish with Dr. Zenaida Manuelita Dutch, PA-C 10/15/24 Advanced Heart Failure

## 2024-10-15 NOTE — Telephone Encounter (Signed)
 Attempted to call patient to let him know that FMLA forms are ready to be picked up. No fax number left on paperwork

## 2024-10-22 ENCOUNTER — Ambulatory Visit (HOSPITAL_COMMUNITY)
Admission: RE | Admit: 2024-10-22 | Discharge: 2024-10-22 | Disposition: A | Discharge status: Home / Self Care | Source: Ambulatory Visit | Attending: Cardiology | Admitting: Cardiology

## 2024-10-22 ENCOUNTER — Ambulatory Visit (HOSPITAL_COMMUNITY): Payer: Self-pay | Admitting: Physician Assistant

## 2024-10-22 ENCOUNTER — Telehealth (HOSPITAL_COMMUNITY): Payer: Self-pay | Admitting: Cardiology

## 2024-10-22 DIAGNOSIS — I5020 Unspecified systolic (congestive) heart failure: Secondary | ICD-10-CM | POA: Insufficient documentation

## 2024-10-22 LAB — BASIC METABOLIC PANEL WITH GFR
Anion gap: 8 (ref 5–15)
BUN: 13 mg/dL (ref 6–20)
CO2: 24 mmol/L (ref 22–32)
Calcium: 8.7 mg/dL — ABNORMAL LOW (ref 8.9–10.3)
Chloride: 103 mmol/L (ref 98–111)
Creatinine, Ser: 1.24 mg/dL (ref 0.61–1.24)
GFR, Estimated: >60 mL/min (ref >60)
Glucose, Bld: 137 mg/dL — ABNORMAL HIGH (ref 70–99)
Potassium: 3.7 mmol/L (ref 3.5–5.1)
Sodium: 135 mmol/L (ref 135–145)

## 2024-10-22 LAB — BRAIN NATRIURETIC PEPTIDE: B Natriuretic Peptide: 190.3 pg/mL — ABNORMAL HIGH (ref 0.0–100.0)

## 2024-10-22 NOTE — Telephone Encounter (Signed)
 Patient picked up FMLA forms today. Scheduled appt w/Stoner on 11/21, also disability forms were faxed to clinic

## 2024-10-26 ENCOUNTER — Ambulatory Visit: Attending: Genetic Counselor | Admitting: Genetic Counselor

## 2024-10-26 NOTE — Progress Notes (Signed)
 Genetic Consult  Post-Test Genetic Consult  Referral Reason  Henry Chandler was referred for a post-test genetic consult of his testing for Cardiomyopathy Comprehensive panel  Personal Medical Information Henry Chandler is a 43 y.o. male with a history of uncontrolled hypertension diagnosed at age 73. He was admitted to [facility] in 2023 for hypertensive urgency with symptoms of chest pain & SOB. Subsequent echocardiogram in May 2023 demonstrated LVEF of 25-30%. Since that time he has also seen Dr. Fernande who has continued to uptitrate his GDMT.    Traditional Risk Factors Henry Chandler denies having other cardiac or systemic conditions that can cause non-ischemic cardiomyopathy, namely, ischemic heart disease, myocarditis, infiltrative myocardial disease (amyloidosis, sarcoidosis, hemochromatosis), alcohol or drug abuse, infection with HIV virus, connective tissue disease (such as systemic lupus erythematosus etc.), doxorubicin therapy and of having other cardiovascular diseases (valvular heart disease, HCM).   Does report having uncontrolled to poorly controlled HTN since age 77.  Family history Henry Chandler does not have children. He has three full siblings- 2 sisters and a brother. One sister was diagnosed with HF after contracting pneumonia at age 60 and subsequently had a heart transplant. She died at 80 from kidney failure. He reports no cardiac issues in both parents.   Genetic Consult notes Genetic testing did not detect a pathogenic variant for cardiomyopathy. 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 However, in depth evaluation reclassifies this variant as likely benign as computational tools predicts that this variant does not disrupt protein function (REVEL score:0.0250, CADD: 0.038) and is found at a higher-than-expected allele frequency for a rare pathogenic variant (gnom Ad V4.1.0: FAF- 9.999607).  Considering that he long -standing HTN, a  known risk factors for NICM, it is likely that his clinical presentation is related to his hypertensive heart disease and not caused by this reports DSG2 variant. Even though, he states that his sister contracted HF after pneumonia, it is important for his first-degree family members pursue cardiac screening by EKG and echocardiogram at regular intervals. Frequency of screening varies by age- relatives over the age of 42 are screened every 5 years, those between 20-50 are screened every 2-3 years and, every 1-3 years for ages 29-19.   I also discussed the protections afforded by the Genetic Information Non-Discrimination Act (GINA). I explained to the patient that GINA protects them from losing their employment or health insurance based on their genotype. However, these protections do not cover life insurance and disability. Patient verbalized understanding of this and will discuss this is siblings.    Danford Pac, Ph.D, [facility] Clinical Molecular Geneticist

## 2024-11-04 ENCOUNTER — Telehealth (HOSPITAL_COMMUNITY): Payer: Self-pay | Admitting: Cardiology

## 2024-11-04 NOTE — Telephone Encounter (Signed)
 Called to confirm/remind patient of their appointment at the [facility] on 11/04/2024.   Appointment:   [x] Confirmed  [] Left mess   [] No answer/No voice mail  [] VM Full/unable to leave message  [] Phone not in service  Patient reminded to bring all medications and/or complete list.  Confirmed patient has transportation. Gave directions, instructed to utilize valet parking.

## 2024-11-05 ENCOUNTER — Ambulatory Visit (HOSPITAL_COMMUNITY)
Admission: RE | Admit: 2024-11-05 | Discharge: 2024-11-05 | Disposition: A | Discharge status: Home / Self Care | Source: Ambulatory Visit | Attending: Cardiology | Admitting: Cardiology

## 2024-11-05 ENCOUNTER — Encounter (HOSPITAL_COMMUNITY): Payer: Self-pay | Admitting: Cardiology

## 2024-11-05 VITALS — BP 126/80 | HR 84 | Ht 73.0 in | Wt 382.4 lb

## 2024-11-05 DIAGNOSIS — I428 Other cardiomyopathies: Secondary | ICD-10-CM | POA: Diagnosis not present

## 2024-11-05 DIAGNOSIS — G4733 Obstructive sleep apnea (adult) (pediatric): Secondary | ICD-10-CM | POA: Diagnosis not present

## 2024-11-05 DIAGNOSIS — E669 Obesity, unspecified: Secondary | ICD-10-CM | POA: Insufficient documentation

## 2024-11-05 DIAGNOSIS — I5022 Chronic systolic (congestive) heart failure: Secondary | ICD-10-CM | POA: Diagnosis present

## 2024-11-05 DIAGNOSIS — R0602 Shortness of breath: Secondary | ICD-10-CM | POA: Insufficient documentation

## 2024-11-05 DIAGNOSIS — Z79899 Other long term (current) drug therapy: Secondary | ICD-10-CM | POA: Insufficient documentation

## 2024-11-05 DIAGNOSIS — I5020 Unspecified systolic (congestive) heart failure: Secondary | ICD-10-CM

## 2024-11-05 DIAGNOSIS — I110 Hypertensive heart disease with heart failure: Secondary | ICD-10-CM | POA: Diagnosis not present

## 2024-11-05 MED ORDER — POTASSIUM CHLORIDE CRYS ER 20 MEQ PO TBCR: 20.0000 meq | EXTENDED_RELEASE_TABLET | Freq: Every day | ORAL | 6 refills | Status: AC

## 2024-11-05 MED ORDER — SPIRONOLACTONE 50 MG PO TABS: 50.0000 mg | ORAL_TABLET | Freq: Every day | ORAL | 6 refills | Status: AC

## 2024-11-05 NOTE — Patient Instructions (Signed)
 Medication Changes:  INCREASE Spironolactone  to 50 mg Daily  DECREASE Potassium to 20 meq (1 tab) Daily  Referrals:  You have been referred to Pharmacy Clinic to discuss weight loss medication, they will call you to schedule  Special Instructions // Education:  Do the following things EVERYDAY: Weigh yourself in the morning before breakfast. Write it down and keep it in a log. Take your medicines as prescribed Eat low salt foods--Limit salt (sodium) to 2000 mg per day.  Stay as active as you can everyday Limit all fluids for the day to less than 2 liters   Follow-Up in: 3 months with Dr Zenaida (February 2026), **PLEASE CALL OUR OFFICE IN [place] TO SCHEDULE THIS APPOINTMENT   At the [facility], you and your health needs are our priority. We have a designated team specialized in the treatment of Heart Failure. This Care Team includes your primary Heart Failure Specialized Cardiologist (physician), Advanced Practice Providers (APPs- Physician Assistants and Nurse Practitioners), and Pharmacist who all work together to provide you with the care you need, when you need it.   You may see any of the following providers on your designated Care Team at your next follow up:  Dr. Toribio Fuel Dr. Ezra Shuck Dr. Odis Zenaida Greig Mosses, NP Caffie Shed, [place] [facility] [place], [place] Beckey Coe, NP Jordan Lee, NP Tinnie Redman, PharmD   Please be sure to bring in all your medications bottles to every appointment.   Need to Contact Us :  If you have any questions or concerns before your next appointment please send us  a message through [place] or call our office at [phone].    TO LEAVE A MESSAGE FOR THE NURSE SELECT OPTION 2, PLEASE LEAVE A MESSAGE INCLUDING: YOUR NAME DATE OF BIRTH CALL BACK NUMBER REASON FOR CALL**this is important as we prioritize the call backs  YOU WILL RECEIVE A CALL BACK THE SAME DAY AS LONG AS YOU CALL BEFORE 4:00  PM

## 2024-11-05 NOTE — Progress Notes (Signed)
   ADVANCED HEART FAILURE FOLLOW UP CLINIC NOTE  Referring Physician: Vonita Quince, PA  Primary Care: Vonita Quince, [place] Primary Cardiologist:  HPI: Henry Chandler is a 43 y.o. male who presents for follow up of chronic systolic heart failure.     He has had uncontrolled HTN since 45-60 years old. He has been on and off anti-hypertensives since then. In 2023, he was off his medications for some time. During a PCP visit, he started having chest pain & SOB; he admitted to [facility] for hypertensive urgency. He subsequently had an echocardiogram in May 2023 with LVEF of 25-30%.    Echo 7/24 showed EF 30-35%, RV normal (EF 35-40% on Dr. Gardenia read).   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 He was referred to [facility].     Last seen on 09/17/24, had been lost to follow-up for over a year. He was volume overloaded and restarted lasix . Bidil  also added back. Readmitted subsequently due to dietary indiscretion, hypertension, overload.      SUBJECTIVE:  PMH, current medications, allergies, social history, and family history reviewed in epic.  PHYSICAL EXAM: There were no vitals filed for this visit. GENERAL: Well nourished and in no apparent distress at rest.  PULM:  Normal work of breathing, clear to auscultation bilaterally. Respirations are unlabored.  CARDIAC:  JVP: ***         Normal rate with regular rhythm. No murmurs, rubs or gallops.  *** edema. Warm and well perfused extremities. ABDOMEN: Soft, non-tender, non-distended. NEUROLOGIC: Patient is oriented x3 with no focal or lateralizing neurologic deficits.    DATA REVIEW  ECG: ***    ECHO: ***   CATH: ***     ASSESSMENT & PLAN:  Chronic systolic heart failure: NICM, suspect largely secondary to hypertensive heart disease. Previous genetic testing and follow up with Dr. Fairy given sister with HF, though revealed VUS. Previously overloaded.   Follow up in ***  Morene Brownie, MD Advanced Heart Failure Mechanical Circulatory Support 11/05/24

## 2024-11-08 ENCOUNTER — Telehealth (HOSPITAL_COMMUNITY): Payer: Self-pay

## 2024-11-08 NOTE — Telephone Encounter (Signed)
 Disability forms faxed to [facility]. My chart message sent to patient to inform him forms were sent.

## 2024-11-10 ENCOUNTER — Encounter: Payer: Self-pay | Admitting: Pulmonary Disease

## 2024-11-10 ENCOUNTER — Ambulatory Visit: Admitting: Pulmonary Disease

## 2024-11-24 ENCOUNTER — Telehealth (HOSPITAL_COMMUNITY): Payer: Self-pay

## 2024-11-24 NOTE — Telephone Encounter (Signed)
 Paper work faxed to [employer]. Patient called and aware of paper work getting faxed

## 2024-12-03 NOTE — Progress Notes (Signed)
 " Advanced Heart Failure Clinic Note  PCP: Vonita Quince, PA PCP-Cardiologist: Oneil Parchment, MD HF-Cardiologist: Morene Brownie, MD  HPI:  Henry Chandler is a 43 y.o. male who presents for follow up of chronic systolic heart failure.   He has had uncontrolled HTN since 67-79 years old. He has been on and off anti-hypertensives since then. In 2023, he was off his medications for some time. During a PCP visit, he started having chest pain & SOB; he admitted to [facility] for hypertensive urgency. He subsequently had an echocardiogram in May 2023 with LVEF of 25-30%.    Echo 06/2023 showed EF 30-35%, RV normal (EF 35-40% on Dr. Gardenia read).   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 He was referred to [facility].     Seen on 09/17/24, had been lost to follow-up for over a year. He was volume overloaded and restarted lasix . Bidil  also added back. Readmitted subsequently due to dietary indiscretion, hypertension, overload.   Last seen by Dr. Brownie 11/05/24. Patient was doing well. Spironolactone  increased to 50 mg daily and potassium was decreased to 20 meq.  Today Henry Chandler returns to Heart Failure Clinic for pharmacist medication titration. Reports feeling ***. {Reports/Denies:210917258} {ACTIONS;DENIES/REPORTS:21021675::Denies} being able to complete all activities of daily living (ADLs). Is *** active throughout the day. Weight at home is *** pounds. Takes {CHL AMB AHFC Medications:210917260} ***. Appetite ***. {Does Follow/Does Not Follow:210917261} a low sodium diet.  Current Heart Failure Medications: Loop diuretic: furosemide  80 mg daily Beta-Blocker: carvedilol  25 mg BID ACEI/ARB/ARNI: irbesartan  300 mg daily MRA: spironolactone  50 mg daily SGLT2i: Jardiance  10 mg daily Other: BiDil  0.5 tablet TID  Has the patient been experiencing any side effects to the medications prescribed? {yes/no:20286}  Does the patient have any problems  obtaining medications due to transportation or finances? {yes/no:20286}  Understanding of regimen: {CHL AMB AHFC Excellent/Good/Fair/Poor:210917262}  Understanding of indications: {CHL AMB AHFC Excellent/Good/Fair/Poor:210917262}  Potential of adherence: {CHL AMB AHFC Excellent/Good/Fair/Poor:210917262}  Patient understands to avoid NSAIDs.  Patient understands to avoid decongestants.  Pertinent Lab Values: Creat  Date Value Ref Range Status  04/30/2017 1.08 0.60 - 1.35 mg/dL Final   Creatinine, Ser  Date Value Ref Range Status  10/22/2024 1.24 0.61 - 1.24 mg/dL Final   BUN  Date Value Ref Range Status  10/22/2024 13 6 - 20 mg/dL Final  97/79/7975 17 6 - 24 mg/dL Final   Potassium  Date Value Ref Range Status  10/22/2024 3.7 3.5 - 5.1 mmol/L Final   Sodium  Date Value Ref Range Status  10/22/2024 135 135 - 145 mmol/L Final  02/04/2023 140 134 - 144 mmol/L Final   B Natriuretic Peptide  Date Value Ref Range Status  10/22/2024 190.3 (H) 0.0 - 100.0 pg/mL Final    Comment:    Performed at [facility], [address] [place], [place] [address]   Magnesium   Date Value Ref Range Status  09/27/2024 2.1 1.7 - 2.4 mg/dL Final    Comment:    Performed at [facility], [address] [place], [place] [address]   TSH  Date Value Ref Range Status  05/10/2022 0.643 0.350 - 4.500 uIU/mL Final    Comment:    Performed by a 3rd Generation assay with a functional sensitivity of <=0.01 uIU/mL. Performed at [facility], [address] [place], [place] [address]    DATA REVIEW   ECG: 09/2024: normal QRS, LAE     ECHO:  09/2024: LVEF 20-25%, severely dilated, grade II DD, RV normal  06/2023: LVEF 30-35% 04/2022: LVEF 20-25%, severely dilated, low normal RV   CATH: 04/2022: Chronic systolic heart failure, LVEDP 28, normal coronary arteries    CMR: 07/2022: Severe LV enlargement with LVEF 26%, ?LVNC, normal RV, no infiltrative  disease/LGE  Vital Signs: There were no vitals filed for this visit.  Assessment/Plan: Chronic systolic heart failure: NICM, suspect largely secondary to hypertensive heart disease. Previous genetic testing and follow up with Dr. Fairy given sister with HF, though revealed VUS. Still seems mildly volume up. - Increase spironolactone  to 50mg  daily - Continue recently increased lasix  dose to 80mg  daily - Continue jardiance  10mg  daily - Continue irbesartan  300mg  daily, no entresto with ACEi angioedema - Continue carvedilol  25mg  BID - Consider CPX testing at next visit - Decrease Kcl to 20mEq daily - Repeat labs at next visit - Continue bidil  0.5 tabs TID   HTN: Longstanding, workup for secondary causes negative - Meds as above - CPAP   Obesity:  - Referral for GLP-1   OSA:  - Following up with pulmonary  Follow up: February with Dr. Zenaida  *** "

## 2024-12-06 ENCOUNTER — Other Ambulatory Visit (HOSPITAL_COMMUNITY): Payer: Self-pay

## 2024-12-06 ENCOUNTER — Ambulatory Visit (HOSPITAL_COMMUNITY)
Admission: RE | Admit: 2024-12-06 | Discharge: 2024-12-06 | Disposition: A | Discharge status: Home / Self Care | Source: Ambulatory Visit | Attending: Internal Medicine | Admitting: Internal Medicine

## 2024-12-06 VITALS — BP 122/88 | HR 99 | Wt 388.6 lb

## 2024-12-06 DIAGNOSIS — I428 Other cardiomyopathies: Secondary | ICD-10-CM | POA: Diagnosis not present

## 2024-12-06 DIAGNOSIS — E669 Obesity, unspecified: Secondary | ICD-10-CM | POA: Insufficient documentation

## 2024-12-06 DIAGNOSIS — I5023 Acute on chronic systolic (congestive) heart failure: Secondary | ICD-10-CM

## 2024-12-06 DIAGNOSIS — I110 Hypertensive heart disease with heart failure: Secondary | ICD-10-CM | POA: Diagnosis present

## 2024-12-06 DIAGNOSIS — I5022 Chronic systolic (congestive) heart failure: Secondary | ICD-10-CM | POA: Diagnosis present

## 2024-12-06 DIAGNOSIS — Z79899 Other long term (current) drug therapy: Secondary | ICD-10-CM | POA: Insufficient documentation

## 2024-12-06 DIAGNOSIS — G4733 Obstructive sleep apnea (adult) (pediatric): Secondary | ICD-10-CM | POA: Insufficient documentation

## 2024-12-06 DIAGNOSIS — Z7984 Long term (current) use of oral hypoglycemic drugs: Secondary | ICD-10-CM | POA: Insufficient documentation

## 2024-12-06 DIAGNOSIS — I5020 Unspecified systolic (congestive) heart failure: Secondary | ICD-10-CM

## 2024-12-06 LAB — BASIC METABOLIC PANEL WITH GFR
Anion gap: 10 (ref 5–15)
BUN: 10 mg/dL (ref 6–20)
CO2: 25 mmol/L (ref 22–32)
Calcium: 8.8 mg/dL — ABNORMAL LOW (ref 8.9–10.3)
Chloride: 102 mmol/L (ref 98–111)
Creatinine, Ser: 1.11 mg/dL (ref 0.61–1.24)
GFR, Estimated: >60 mL/min
Glucose, Bld: 148 mg/dL — ABNORMAL HIGH (ref 70–99)
Potassium: 3.9 mmol/L (ref 3.5–5.1)
Sodium: 137 mmol/L (ref 135–145)

## 2024-12-06 MED ORDER — ISOSORB DINITRATE-HYDRALAZINE 20-37.5 MG PO TABS: ORAL_TABLET | ORAL | 3 refills | Status: DC

## 2024-12-06 MED ORDER — ATORVASTATIN CALCIUM 40 MG PO TABS: 40.0000 mg | ORAL_TABLET | Freq: Every day | ORAL | 3 refills | Status: AC

## 2024-12-06 MED ORDER — IRBESARTAN 300 MG PO TABS: 300.0000 mg | ORAL_TABLET | Freq: Every day | ORAL | 1 refills | Status: DC

## 2024-12-06 MED ORDER — EMPAGLIFLOZIN 10 MG PO TABS: 10.0000 mg | ORAL_TABLET | Freq: Every day | ORAL | 3 refills | Status: AC

## 2024-12-06 MED ORDER — CARVEDILOL 25 MG PO TABS: 25.0000 mg | ORAL_TABLET | Freq: Two times a day (BID) | ORAL | 11 refills | Status: DC

## 2024-12-06 MED ORDER — FUROSEMIDE 40 MG PO TABS: ORAL_TABLET | ORAL | 5 refills | Status: AC

## 2024-12-06 NOTE — Patient Instructions (Signed)
 It was a pleasure seeing you today!  MEDICATIONS: -We are changing your medications today -Increase furosemide  to 80 mg twice daily x 3 days, then you can take 80 mg daily with an additional 80 mg as needed for symptoms of shortness of breath/swelling and weight gain of 3 lbs in 24 hours or 5 lbs over a week -Increase BiDil  to 1 tablet three times daily. If you have headache again, you can decrease it back to half a tablet three times daily. -Call if you have questions about your medications.  LABS: -We will call you if your labs need attention.  NEXT APPOINTMENT: Return to clinic in in 2 weeks with APP clinic.  In general, to take care of your heart failure: -Limit your fluid intake to 2 Liters (half-gallon) per day.   -Limit your salt intake to ideally 2-3 grams (2000-3000 mg) per day. -Weigh yourself daily and record, and bring that weight diary to your next appointment.  (Weight gain of 2-3 pounds in 1 day typically means fluid weight.) -The medications for your heart are to help your heart and help you live longer.   -Please contact us  before stopping any of your heart medications.  Call the clinic at [phone] with questions or to reschedule future appointments.

## 2024-12-21 ENCOUNTER — Telehealth (HOSPITAL_COMMUNITY): Payer: Self-pay | Admitting: Cardiology

## 2024-12-21 NOTE — Telephone Encounter (Signed)
 Called to confirm/remind patient of their appointment at the Advanced Heart Failure Clinic on 12/21/24.   Appointment:   [] Confirmed  [x] Left mess   [] No answer/No voice mail  [] VM Full/unable to leave message  [] Phone not in service  Patient reminded to bring all medications and/or complete list.  Confirmed patient has transportation. Gave directions, instructed to utilize valet parking.

## 2024-12-22 ENCOUNTER — Ambulatory Visit (HOSPITAL_COMMUNITY)
Admission: RE | Admit: 2024-12-22 | Discharge: 2024-12-22 | Disposition: A | Discharge status: Home / Self Care | Source: Ambulatory Visit | Attending: Cardiology | Admitting: Cardiology

## 2024-12-22 ENCOUNTER — Encounter (HOSPITAL_COMMUNITY): Payer: Self-pay | Admitting: Cardiology

## 2024-12-22 DIAGNOSIS — Z79899 Other long term (current) drug therapy: Secondary | ICD-10-CM | POA: Insufficient documentation

## 2024-12-22 DIAGNOSIS — G4733 Obstructive sleep apnea (adult) (pediatric): Secondary | ICD-10-CM | POA: Diagnosis not present

## 2024-12-22 DIAGNOSIS — E669 Obesity, unspecified: Secondary | ICD-10-CM | POA: Insufficient documentation

## 2024-12-22 DIAGNOSIS — I5022 Chronic systolic (congestive) heart failure: Secondary | ICD-10-CM | POA: Insufficient documentation

## 2024-12-22 DIAGNOSIS — I110 Hypertensive heart disease with heart failure: Secondary | ICD-10-CM | POA: Diagnosis present

## 2024-12-22 DIAGNOSIS — I5020 Unspecified systolic (congestive) heart failure: Secondary | ICD-10-CM

## 2024-12-22 DIAGNOSIS — I428 Other cardiomyopathies: Secondary | ICD-10-CM | POA: Diagnosis not present

## 2024-12-22 MED ORDER — CARVEDILOL 25 MG PO TABS: 50.0000 mg | ORAL_TABLET | Freq: Two times a day (BID) | ORAL | 3 refills | Status: AC

## 2024-12-22 NOTE — Patient Instructions (Signed)
 CHANGE Carvedilol  to 50 mg Twice daily  Your physician recommends that you schedule a follow-up appointment in: 2 months.  If you have any questions or concerns before your next appointment please send us  a message through [place] or call our office at [phone].    TO LEAVE A MESSAGE FOR THE NURSE SELECT OPTION 2, PLEASE LEAVE A MESSAGE INCLUDING: YOUR NAME DATE OF BIRTH CALL BACK NUMBER REASON FOR CALL**this is important as we prioritize the call backs  YOU WILL RECEIVE A CALL BACK THE SAME DAY AS LONG AS YOU CALL BEFORE 4:00 PM  At the Advanced Heart Failure Clinic, you and your health needs are our priority. As part of our continuing mission to provide you with exceptional heart care, we have created designated Provider Care Teams. These Care Teams include your primary Cardiologist (physician) and Advanced Practice Providers (APPs- Physician Assistants and Nurse Practitioners) who all work together to provide you with the care you need, when you need it.   You may see any of the following providers on your designated Care Team at your next follow up: Dr Toribio Fuel Dr Ezra Shuck Dr. Morene Brownie Greig Mosses, NP Caffie Shed, [place] [facility] [place], [place] Beckey Coe, NP Jordan Lee, NP Ellouise Class, NP Tinnie Redman, PharmD Jaun Bash, PharmD   Please be sure to bring in all your medications bottles to every appointment.    Thank you for choosing [facility] Failure [facility]

## 2024-12-24 ENCOUNTER — Telehealth: Payer: Self-pay | Admitting: Pharmacist Clinician (PhC)/ Clinical Pharmacy Specialist

## 2024-12-24 ENCOUNTER — Ambulatory Visit: Attending: Cardiology | Admitting: Pharmacist Clinician (PhC)/ Clinical Pharmacy Specialist

## 2024-12-24 ENCOUNTER — Telehealth: Payer: Self-pay | Admitting: Pharmacy Technician

## 2024-12-24 ENCOUNTER — Other Ambulatory Visit (HOSPITAL_COMMUNITY): Payer: Self-pay

## 2024-12-24 ENCOUNTER — Encounter: Payer: Self-pay | Admitting: Pharmacist Clinician (PhC)/ Clinical Pharmacy Specialist

## 2024-12-24 NOTE — Assessment & Plan Note (Signed)
" °  Patient has not met goal of at least 5% of body weight loss with comprehensive lifestyle modifications alone in the past 3-6 months. Pharmacotherapy is appropriate to pursue as augmentation. Will start Zepbound.  Patient also has history of obstructive sleep apnea, with recent Sleep study showing an AHI of 45.2   Advised patient on common side effects including nausea, diarrhea, dyspepsia, decreased appetite, and fatigue. Counseled patient on reducing meal size and how to titrate medication to minimize side effects. Patient aware to call if intolerable side effects or if experiencing dehydration, abdominal pain, or dizziness. Patient will adhere to dietary modifications and will target at least 150 minutes of moderate intensity exercise weekly, plus resistance training twice a week (as recommended by the American Heart Association). This resistance training--such as weightlifting, bodyweight exercises, or using resistance bands, adapted to the patient's ability--will help prevent muscle loss.  Injection technique reviewed at today's visit.    Follow up in 1-2 days regarding coverage of Zepbound . If therapy is initiated, phone or MyChart follow-ups will be conducted every 4 weeks for dose titration until the patient reaches the effective therapeutic dose and target weight.  "

## 2024-12-24 NOTE — Progress Notes (Signed)
 "    Office Visit    Patient Name: Henry Chandler Date of Encounter: 12/24/2024  Primary Care Provider:  Vonita Quince, PA Primary Cardiologist:  Oneil Parchment, MD  Chief Complaint    Weight management  Significant Past Medical History   OSA Sleep study 11/2024 - AHI = 45.2    HFrEF 10/25 by echo 20-25%, sees HF clinic, on GDMT  HTN No secondary causes found, control with GDMT above    Allergies[1]  History of Present Illness    Henry Chandler is a 44 y.o. male patient of Dr Zenaida, in the office today to discuss options for weight management.    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  Family History: sister had heart transplant at 61; died several years later  Social History:   Tobacco: trying to quit   Alcohol: socially - couple times per month  Caffeine: tea and soda regularly  Accessory Clinical Findings    Lab Results  Component Value Date   CREATININE 1.11 12/06/2024   BUN 10 12/06/2024   NA 137 12/06/2024   K 3.9 12/06/2024   CL 102 12/06/2024   CO2 25 12/06/2024   Lab Results  Component Value Date   ALT 41 09/28/2024   AST 24 09/28/2024   ALKPHOS 91 09/28/2024   BILITOT 0.5 09/28/2024   Lab Results  Component Value Date   HGBA1C 5.6 05/11/2022      Home Medications/Allergies    Current Outpatient Medications  Medication Sig Dispense Refill   acetaminophen  (TYLENOL ) 325 MG tablet Take 650 mg by mouth every 6 (six) hours as needed for mild pain.     AIRSUPRA 90-80 MCG/ACT AERO Inhale 2 puffs into the lungs every 4 (four) hours as needed.     albuterol  (VENTOLIN  HFA) 108 (90 Base)  MCG/ACT inhaler Inhale 2 puffs into the lungs every 6 (six) hours as needed.     atorvastatin  (LIPITOR) 40 MG tablet Take 1 tablet (40 mg total) by mouth daily at 6 PM. 90 tablet 3   carvedilol  (COREG ) 25 MG tablet Take 2 tablets (50 mg total) by mouth 2 (two) times daily with a meal. 200 tablet 3   empagliflozin  (JARDIANCE ) 10 MG TABS tablet Take 1 tablet (10 mg total) by mouth daily. 90 tablet 3   furosemide  (LASIX ) 40 MG tablet Take 2 tablets (80 mg) by mouth once daily. You can take an additional 2 tablets as needed for weight gain of 3 lbs in 24 hours or 5 lbs in a week with symptoms of congestion. 60 tablet 5   irbesartan  (AVAPRO ) 300 MG tablet Take 1 tablet (300 mg total) by mouth daily. 30 tablet 1   isosorbide -hydrALAZINE  (BIDIL ) 20-37.5 MG tablet Take 1/2 tablet three times daily 90 tablet 3   nicotine  (NICODERM CQ  - DOSED IN MG/24 HOURS) 14 mg/24hr patch Place 1 patch (14 mg total) onto the skin daily as needed (tobacco dependence). (Patient not taking: Reported on 12/22/2024) 28 patch 0   potassium chloride  SA (KLOR-CON  M) 20 MEQ tablet Take 1 tablet (20 mEq total) by mouth daily. 30 tablet 6   spironolactone  (ALDACTONE ) 50 MG tablet Take 1 tablet (50  mg total) by mouth daily. 30 tablet 6   valACYclovir  (VALTREX ) 1000 MG tablet Take 1 tablet (1,000 mg total) by mouth 3 (three) times daily. 21 tablet 0   No current facility-administered medications for this visit.     Allergies[2]  Assessment & Plan    Obesity, morbid, BMI 50 or higher (HCC)  Patient has not met goal of at least 5% of body weight loss with comprehensive lifestyle modifications alone in the past 3-6 months. Pharmacotherapy is appropriate to pursue as augmentation. Will start Zepbound.  Patient also has history of obstructive sleep apnea, with recent Sleep study showing an AHI of 45.2   Advised patient on common side effects including nausea, diarrhea, dyspepsia, decreased appetite, and fatigue. Counseled patient on  reducing meal size and how to titrate medication to minimize side effects. Patient aware to call if intolerable side effects or if experiencing dehydration, abdominal pain, or dizziness. Patient will adhere to dietary modifications and will target at least 150 minutes of moderate intensity exercise weekly, plus resistance training twice a week (as recommended by the American Heart Association). This resistance training--such as weightlifting, bodyweight exercises, or using resistance bands, adapted to the patient's ability--will help prevent muscle loss.  Injection technique reviewed at today's visit.    Follow up in 1-2 days regarding coverage of Zepbound . If therapy is initiated, phone or MyChart follow-ups will be conducted every 4 weeks for dose titration until the patient reaches the effective therapeutic dose and target weight.   Allean Mink PharmD CPP [facility]  [address] [place], [place] [address] [phone]      [1]  Allergies Allergen Reactions   Lisinopril  Anaphylaxis and Swelling    Swelling of tongue  [2]  Allergies Allergen Reactions   Lisinopril  Anaphylaxis and Swelling    Swelling of tongue   "

## 2024-12-24 NOTE — Telephone Encounter (Signed)
 Please try PA for Zepbound with OSA dx.

## 2024-12-24 NOTE — Patient Instructions (Signed)
 We will start the prior authorization process to get Zepbound covered by your insurance.   TIPS FOR SUCCESS Write down the reasons why you want to lose weight and post it in a place where you'll see it often. Start small and work your way up. Keep in mind that it takes time to achieve goals, and small steps add up. Any additional movements help to burn calories. Taking the stairs rather than the elevator and parking at the far end of your parking lot are easy ways to start. Brisk walking for at least 30 minutes 4 or more days of the week is an excellent goal to work toward  [employer] WHAT IT MEANS TO FEEL FULL Did you know that it can take 15 minutes or more for your brain to receive the message that you've eaten? This means that if you eat less food, but consume it slower, you may still feel satisfied. Eating a lot of fruits and vegetables can also help you feel fuller. Eat off of smaller plates so that moderate portions don't seem too small  TITRATION PLAN  You will start with the lowest dose and increase every 4 weeks (30 days for tablets) as tolerated.  If you feel you need another month on the same dose, that is perfectly fine.  GI side effects are the most problematic (nausea, vomiting).  When you get to the 4th injection (4th week of tablets) please send me a MyChart message or call [phone], and tell me the following information:  1.  Any change in weight (we don't always expect in the first month or two). 2.  Any side effects (mostly nausea/vomiting) 3.  Do you feel as though you are wanting less food? 4.  Are you continuing to work on exercise and lifestyle modifications. 5.  Are you ready to move up to the next dose, or would you like to  stay here for another 4 weeks. (We encourage staying at the same dose if nausea and/or vomiting were problematic)  I need to have this information to help determine whether to increase the dose or stay the same for another month.    If you  have any questions or concerns, please reach out to me.  MyChart is the fastest way to find me, but you can also call me directly at [phone].  (Note: I am not always available to check phone messages daily)  THANK YOU FOR CHOOSING [facility] Allean

## 2024-12-24 NOTE — Telephone Encounter (Signed)
 This is termed per test claim   Sent note to patient

## 2024-12-26 ENCOUNTER — Other Ambulatory Visit (HOSPITAL_COMMUNITY): Payer: Self-pay | Admitting: Cardiology

## 2024-12-27 NOTE — Progress Notes (Signed)
" ° °  ADVANCED HEART FAILURE FOLLOW UP CLINIC NOTE  Referring Physician: Vonita Quince, PA  Primary Care: Vonita Quince, [place] Primary Cardiologist:  HPI: Henry Chandler is a 44 y.o. male who presents for follow up of chronic systolic heart failure.     He has had uncontrolled HTN since [age] years old. He has been on and off anti-hypertensives since then. In 2023, he was off his medications for some time. During a PCP visit, he started having chest pain & SOB; he admitted to [facility] for hypertensive urgency. He subsequently had an echocardiogram in May 2023 with LVEF of 25-30%.    Echo 7/24 showed EF 30-35%, RV normal (EF 35-40% on Dr. Gardenia read).   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 He was referred to [facility].     Last seen on 09/17/24, had been lost to follow-up for over a year. He was volume overloaded and restarted lasix . Bidil  also added back. Readmitted subsequently due to dietary indiscretion, hypertension, overload.      SUBJECTIVE:  Reports doing fair since his last visit, has tolerated the increase in medications well.  He is currently taking 1 pill of BiDil  3 times daily denies any significant headaches.  Still gets short of breath with more than mild to moderate exertion, has not been able to return to work given his HF limitations at his second job.   PMH, current medications, allergies, social history, and family history reviewed in epic.  PHYSICAL EXAM: Vitals:   12/22/24 1535  BP: 126/82  Pulse: (!) 114  SpO2: 94%   GENERAL: NAD, fair appearing PULM:  Normal work of breathing, CTAB CARDIAC:  JVP: flat         Normal rate with regular rhythm. No murmurs, rubs or gallops.  Trace edema. Warm and well perfused extremities. ABDOMEN: Soft, non-tender, non-distended. NEUROLOGIC: Patient is oriented x3 with no focal or lateralizing neurologic deficits.     DATA REVIEW  ECG: 09/2024: normal QRS, LAE     ECHO: 09/2024: LVEF 20-25%, severely dilated, grade II DD, RV normal  06/2023: LVEF 30-35% 04/2022: LVEF 20-25%, severely dilated, low normal RV  CATH: 04/2022: Chronic systolic heart failure, LVEDP 28, normal coronary arteries   CMR: 07/2022: Severe LV enlargement with LVEF 26%, ?LVNC, normal RV, no infiltrative disease/LGE   ASSESSMENT & PLAN:  Chronic systolic heart failure: NICM, suspect largely secondary to hypertensive heart disease. Previous genetic testing and follow up with Dr. Fairy given sister with HF, though revealed VUS. Volume status improved today, NYHA class III.  - Continue spironolactone  50 mg daily - Continue Lasix  80 mg daily -Increase carvedilol  to 50 mg daily -Continue Jardiance  10 mg daily -Continue irbesartan  300 mg daily, no Entresto with angioedema to ACE inhibitors -Repeat echo prior to next visit -CPX testing if persistently reduced - Continue BiDil  1 tab 3 times daily, blood pressure well-controlled  HTN: Longstanding, workup for secondary causes negative - Meds as above - CPAP  Obesity:  - Referral for GLP-1, hopefully will start soon  OSA:  - Following up with pulmonary  FMLA paperwork, unable to perform duties of second job given HF limitations   Morene Brownie, MD Advanced Heart Failure Mechanical Circulatory Support 12/27/2024 "

## 2025-01-03 ENCOUNTER — Other Ambulatory Visit (HOSPITAL_COMMUNITY): Payer: Self-pay

## 2025-01-03 NOTE — Telephone Encounter (Signed)
 Bin: [id number] PcnBETHA nickey Balboa: [id number] ab Group: [id number] [employer]    Per insurance:  ENROLLMENT AND PRESCRIPTION FROM [facility] IS REQUIRED FOR COVERAGE. CALL [phone] OR VISIT [email or web address]. ENROLL IN COPAY ASSISTANCE POST ADJUDICATION. MAX 2 IN 180 DAYS.

## 2025-01-13 NOTE — Progress Notes (Signed)
 " OFFICE NOTE:    Date:  01/14/2025  ID:  Henry Chandler, DOB Jan 08, 1981, MRN [id number] PCP: Vonita Quince, PA  [facility] Providers Cardiologist:  Oneil Parchment, MD        (HFrEF) heart failure with reduced ejection fraction  Non-ischemic cardiomyopathy (LHC 04/2022: no CAD) Genetic testing: variant of unknown significance Likely most related to HTN heart disease CMR 07/31/22: EF 26, non compaction, no LGE Felt to be due to uncontrolled HTN not non-compaction  TTE 09/28/24: EF 20-25, global HK, mild LVH, Gr 2 DD, NL RVSF, trivial MR Hx of angioedema w ACE >> No ARNI Hypertension  Obesity  Hx of resp failure in setting of COVID-19 in 05/2019 c/b rhabdomyolysis, AKI +Cigs ETOH abuse  Hx of marijuana use  FHx of heart disease (Sister w heart Tx in 30s; mother w PCI) OSA        Discussed the use of AI scribe software for clinical note transcription with the patient, who gave verbal consent to proceed. History of Present Illness Henry Chandler is a 44 y.o. male for follow up of CHF. Last seen by Dr. Parchment in 5/29024. He is followed in CHF Clinic as well and last saw Dr. Zenaida 12/22/24. His Coreg  was increased to 50 mg twice daily.   He experiences shortness of breath, particularly during exertion such as walking long distances or across the street. This has been a persistent issue without recent worsening. No orthopnea is noted. He denies significant weight changes. He reports no chest pain or syncope. He experiences dizziness and lightheadedness approximately 20 to 30 minutes after taking medications, particularly after the carvedilol  dosage was increased to 50 mg twice daily. He saw EP in the past and previously declined a defibrillator. He monitors his blood pressure at home, which typically ranges from 110 to 120 mmHg. He has not checked it since the recent change in Coreg . He denies smoking.    Review of Systems  Constitutional: Negative for fever.  Respiratory:   Negative for cough.   Gastrointestinal:  Negative for diarrhea, hematochezia, melena and vomiting.  Genitourinary:  Negative for hematuria.  -See HPI     Studies Reviewed:  EKG Interpretation Date/Time:  Friday January 14 2025 08:36:53 EST Ventricular Rate:  80 PR Interval:  170 QRS Duration:  106 QT Interval:  408 QTC Calculation: 470 R Axis:   7  Text Interpretation: Normal sinus rhythm T wave abnormality, consider lateral ischemia Prolonged QT No significant change since last tracing Confirmed by Lelon Hamilton [phone]) on 01/14/2025 8:39:24 AM    LABS 09/29/24: Hg 14.5, Plt 274K, ALT 24 12/06/24: k 3.9, SCr 1.11, eGFR > 60  Results          Physical Exam:  VS:  BP (!) 86/55   Pulse 83   Ht 6' 1 (1.854 m)   Wt (!) 377 lb 3.2 oz (171.1 kg)   SpO2 91%   BMI 49.77 kg/m        Wt Readings from Last 3 Encounters:  01/14/25 (!) 377 lb 3.2 oz (171.1 kg)  12/24/24 (!) 380 lb 9.6 oz (172.6 kg)  12/22/24 (!) 378 lb 6.4 oz (171.6 kg)    Constitutional:      Appearance: Healthy appearance. Not in distress.  Neck:     Vascular: JVD normal.  Pulmonary:     Breath sounds: Normal breath sounds. No wheezing. No rales.  Cardiovascular:     Normal rate. Regular rhythm.  Murmurs: There is no murmur.  Edema:    Peripheral edema absent.  Abdominal:     Palpations: Abdomen is soft.        Assessment and Plan:    Assessment & Plan HFrEF (heart failure with reduced ejection fraction) (HCC) Non-ischemic cardiomyopathy. DCM mainly felt to be due to hypertensive heart disease. He is NYHA IIb-III. Volume status stable. His Coreg  was increased to 50 mg twice daily at last visit with CHF clinic. Since then, he has noted dizziness for an hour or two after he takes his medications. His BP was very difficult to hear today. Finally, by a Dynamap, we were able to get a BP of 86/55. He is mildly symptomatic with this. He is not having syncope or near syncope. I do not think he needs to go  to the ED. I will adjust his medications and bring him back for close follow up. I have also asked him to contact us  if his BP continues to run low over the weekend. Of note, his HR is in the 80s on EKG. Therefore, I think we can continue his Coreg  at current dose.  - Continue Coreg  50 mg twice daily  - Continue Jardiance  10 mg once daily  - Continue Irbesartan  300 mg once daily (hx of angioedema with ACE) - Continue Spironolactone  50 mg once daily  - Hold PM meds today. - Stop Isosorbide /Hydralazine  - BMET, CBC today - Monitor BP over the weekend and call if SBP < 100 or > 130 - If BP continues to run low, decrease Irbesartan  to 150 mg once daily  - Follow up with me Tues 2/3 at 8:50am. - Keep follow up with CHF clinic in March. Primary hypertension Hypotension due to drugs BP now running low. He is mildly symptomatic. As noted, adjust medications and return for early follow up. We discussed hydration today but I did not recommend liberalization with salt.  OSA (obstructive sleep apnea) He is waiting on CPAP machine.  Class 3 severe obesity due to excess calories with serious comorbidity and body mass index (BMI) of 40.0 to 44.9 in adult [facility]) Our PharmD has looked into GLP1a's for him. He can request coverage through his insurance. He knows he needs to pursue this.          Dispo:  Return in 4 days (on 01/18/2025) for Close Follow Up, w/ Glendia Ferrier, PA-C.  Signed, Glendia Ferrier, PA-C   "

## 2025-01-14 ENCOUNTER — Encounter: Payer: Self-pay | Admitting: Physician Assistant

## 2025-01-14 ENCOUNTER — Ambulatory Visit: Attending: Physician Assistant | Admitting: Physician Assistant

## 2025-01-14 VITALS — BP 86/55 | HR 83 | Ht 73.0 in | Wt 377.2 lb

## 2025-01-14 DIAGNOSIS — E66813 Obesity, class 3: Secondary | ICD-10-CM

## 2025-01-14 DIAGNOSIS — I5020 Unspecified systolic (congestive) heart failure: Secondary | ICD-10-CM | POA: Diagnosis not present

## 2025-01-14 DIAGNOSIS — Z6841 Body Mass Index (BMI) 40.0 and over, adult: Secondary | ICD-10-CM | POA: Diagnosis not present

## 2025-01-14 DIAGNOSIS — G4733 Obstructive sleep apnea (adult) (pediatric): Secondary | ICD-10-CM

## 2025-01-14 DIAGNOSIS — I952 Hypotension due to drugs: Secondary | ICD-10-CM | POA: Diagnosis not present

## 2025-01-14 DIAGNOSIS — I10 Essential (primary) hypertension: Secondary | ICD-10-CM

## 2025-01-14 LAB — BASIC METABOLIC PANEL WITH GFR
BUN/Creatinine Ratio: 11 (ref 9–20)
BUN: 18 mg/dL (ref 6–24)
CO2: 21 mmol/L (ref 20–29)
Calcium: 9.2 mg/dL (ref 8.7–10.2)
Chloride: 104 mmol/L (ref 96–106)
Creatinine, Ser: 1.59 mg/dL — ABNORMAL HIGH (ref 0.76–1.27)
Glucose: 103 mg/dL — ABNORMAL HIGH (ref 70–99)
Potassium: 4.5 mmol/L (ref 3.5–5.2)
Sodium: 139 mmol/L (ref 134–144)
eGFR: 55 mL/min/{1.73_m2} — ABNORMAL LOW

## 2025-01-14 LAB — CBC
Hematocrit: 44.8 % (ref 37.5–51.0)
Hemoglobin: 14.6 g/dL (ref 13.0–17.7)
MCH: 28.5 pg (ref 26.6–33.0)
MCHC: 32.6 g/dL (ref 31.5–35.7)
MCV: 88 fL (ref 79–97)
Platelets: 274 10*3/uL (ref 150–450)
RBC: 5.12 x10E6/uL (ref 4.14–5.80)
RDW: 15.1 % (ref 11.6–15.4)
WBC: 6.7 10*3/uL (ref 3.4–10.8)

## 2025-01-14 MED ORDER — IRBESARTAN 300 MG PO TABS: 300.0000 mg | ORAL_TABLET | Freq: Every day | ORAL | 3 refills | Status: DC

## 2025-01-14 NOTE — Assessment & Plan Note (Addendum)
 Our PharmD has looked into GLP1a's for him. He can request coverage through his insurance. He knows he needs to pursue this.

## 2025-01-14 NOTE — Assessment & Plan Note (Addendum)
 Non-ischemic cardiomyopathy. DCM mainly felt to be due to hypertensive heart disease. He is NYHA IIb-III. Volume status stable. His Coreg  was increased to 50 mg twice daily at last visit with CHF clinic. Since then, he has noted dizziness for an hour or two after he takes his medications. His BP was very difficult to hear today. Finally, by a Dynamap, we were able to get a BP of 86/55. He is mildly symptomatic with this. He is not having syncope or near syncope. I do not think he needs to go to the ED. I will adjust his medications and bring him back for close follow up. I have also asked him to contact us  if his BP continues to run low over the weekend. Of note, his HR is in the 80s on EKG. Therefore, I think we can continue his Coreg  at current dose.  - Continue Coreg  50 mg twice daily  - Continue Jardiance  10 mg once daily  - Continue Irbesartan  300 mg once daily (hx of angioedema with ACE) - Continue Spironolactone  50 mg once daily  - Hold PM meds today. - Stop Isosorbide /Hydralazine  - BMET, CBC today - Monitor BP over the weekend and call if SBP < 100 or > 130 - If BP continues to run low, decrease Irbesartan  to 150 mg once daily  - Follow up with me Tues 2/3 at 8:50am. - Keep follow up with CHF clinic in March.

## 2025-01-14 NOTE — Patient Instructions (Addendum)
 Medication Instructions:  STOP IMDUR   DO NOT TAKE  YOUR EVENING MEDICATIONS TODAY  MONITOR YOUR BLOOD PRESSURE.  CALL THE OFFICE IF THE SYSTOLIC NUMBER IS BELOW 100 OR ABOVE 130  MAKE SURE TO HYDRATE   *If you need a refill on your cardiac medications before your next appointment, please call your pharmacy*  Lab Work: CBC, BMET If you have labs (blood work) drawn today and your tests are completely normal, you will receive your results only by: MyChart Message (if you have MyChart) OR A paper copy in the mail If you have any lab test that is abnormal or we need to change your treatment, we will call you to review the results.  Testing/Procedures: No procedures were ordered during today's visit.   Follow-Up: At [facility], you and your health needs are our priority.  As part of our continuing mission to provide you with exceptional heart care, our providers are all part of one team.  This team includes your primary Cardiologist (physician) and Advanced Practice Providers or APPs (Physician Assistants and Nurse Practitioners) who all work together to provide you with the care you need, when you need it.  Your next appointment:  KEEP SCHEDULED APPOINTMENT    Provider:   Glendia Ferrier, PA-C          We recommend signing up for the patient portal called MyChart.  Sign up information is provided on this After Visit Summary.  MyChart is used to connect with patients for Virtual Visits (Telemedicine).  Patients are able to view lab/test results, encounter notes, upcoming appointments, etc.  Non-urgent messages can be sent to your provider as well.   To learn more about what you can do with MyChart, go to [email or web address].   Other Instructions

## 2025-01-14 NOTE — Assessment & Plan Note (Signed)
 He is waiting on CPAP machine.

## 2025-01-15 ENCOUNTER — Ambulatory Visit: Payer: Self-pay | Admitting: Physician Assistant

## 2025-01-17 ENCOUNTER — Encounter: Payer: Self-pay | Admitting: *Deleted

## 2025-01-17 NOTE — Assessment & Plan Note (Signed)
 Non-ischemic cardiomyopathy. DCM mainly felt to be due to hypertensive heart disease. He is NYHA IIb-III. Volume is stable. BP at last visit was 86/55. His Creatinine was increased some with low BP. I stopped his BiDil . His symptoms are improved and his BP is somewhat better. His BP is very difficult to hear manually.  - Continue carvedilol  50 mg twice daily. - Continue Jardiance  10 mg daily. - Continue spironolactone  50 mg daily. - Continue K+ 20 mEq once daily  - Decrease irbesartan  to 150 mg daily. - Remain off isosorbide /hydralazine . - BMET in 1 week - Follow up with heart failure clinic as planned 3/6.

## 2025-01-18 ENCOUNTER — Encounter: Payer: Self-pay | Admitting: Physician Assistant

## 2025-01-18 ENCOUNTER — Ambulatory Visit: Admitting: Physician Assistant

## 2025-01-18 ENCOUNTER — Other Ambulatory Visit: Payer: Self-pay

## 2025-01-18 VITALS — BP 91/58 | HR 89 | Ht 73.0 in | Wt 382.0 lb

## 2025-01-18 DIAGNOSIS — I5020 Unspecified systolic (congestive) heart failure: Secondary | ICD-10-CM

## 2025-01-18 MED ORDER — IRBESARTAN 300 MG PO TABS: 150.0000 mg | ORAL_TABLET | Freq: Every day | ORAL | 3 refills | Status: AC

## 2025-02-18 ENCOUNTER — Ambulatory Visit (HOSPITAL_COMMUNITY)
